# Patient Record
Sex: Male | Born: 1935 | Race: White | Hispanic: No | Marital: Married | Smoking: Former smoker
Health system: Southern US, Community
[De-identification: ages and names within clinical notes are randomized; demographics above are authoritative.]

## PROBLEM LIST (undated history)

## (undated) DIAGNOSIS — F329 Major depressive disorder, single episode, unspecified: Secondary | ICD-10-CM

## (undated) DIAGNOSIS — F32A Depression, unspecified: Secondary | ICD-10-CM

## (undated) DIAGNOSIS — Z9981 Dependence on supplemental oxygen: Secondary | ICD-10-CM

## (undated) DIAGNOSIS — R0902 Hypoxemia: Secondary | ICD-10-CM

## (undated) DIAGNOSIS — J449 Chronic obstructive pulmonary disease, unspecified: Secondary | ICD-10-CM

## (undated) DIAGNOSIS — R918 Other nonspecific abnormal finding of lung field: Secondary | ICD-10-CM

## (undated) DIAGNOSIS — I10 Essential (primary) hypertension: Secondary | ICD-10-CM

## (undated) DIAGNOSIS — J439 Emphysema, unspecified: Secondary | ICD-10-CM

## (undated) DIAGNOSIS — J45909 Unspecified asthma, uncomplicated: Secondary | ICD-10-CM

## (undated) DIAGNOSIS — IMO0001 Reserved for inherently not codable concepts without codable children: Secondary | ICD-10-CM

## (undated) DIAGNOSIS — F419 Anxiety disorder, unspecified: Secondary | ICD-10-CM

## (undated) HISTORY — DX: Emphysema, unspecified: J43.9

## (undated) HISTORY — DX: Hypoxemia: R09.02

## (undated) HISTORY — DX: Essential (primary) hypertension: I10

## (undated) HISTORY — DX: Unspecified asthma, uncomplicated: J45.909

## (undated) HISTORY — DX: Chronic obstructive pulmonary disease, unspecified: J44.9

---

## 2001-01-23 ENCOUNTER — Other Ambulatory Visit: Admission: RE | Admit: 2001-01-23 | Discharge: 2001-01-23 | Payer: Self-pay | Admitting: *Deleted

## 2001-02-16 ENCOUNTER — Encounter (INDEPENDENT_AMBULATORY_CARE_PROVIDER_SITE_OTHER): Payer: Self-pay | Admitting: *Deleted

## 2001-02-16 ENCOUNTER — Ambulatory Visit (HOSPITAL_BASED_OUTPATIENT_CLINIC_OR_DEPARTMENT_OTHER): Admission: RE | Admit: 2001-02-16 | Discharge: 2001-02-17 | Payer: Self-pay | Admitting: *Deleted

## 2001-05-21 ENCOUNTER — Ambulatory Visit (HOSPITAL_COMMUNITY): Admission: RE | Admit: 2001-05-21 | Discharge: 2001-05-21 | Payer: Self-pay | Admitting: Gastroenterology

## 2001-05-21 ENCOUNTER — Encounter (INDEPENDENT_AMBULATORY_CARE_PROVIDER_SITE_OTHER): Payer: Self-pay | Admitting: Specialist

## 2002-07-18 HISTORY — PX: OTHER SURGICAL HISTORY: SHX169

## 2003-10-04 ENCOUNTER — Emergency Department (HOSPITAL_COMMUNITY): Admission: EM | Admit: 2003-10-04 | Discharge: 2003-10-04 | Payer: Self-pay | Admitting: Emergency Medicine

## 2003-10-13 ENCOUNTER — Inpatient Hospital Stay (HOSPITAL_COMMUNITY): Admission: RE | Admit: 2003-10-13 | Discharge: 2003-10-15 | Payer: Self-pay | Admitting: Urology

## 2003-10-13 ENCOUNTER — Encounter (INDEPENDENT_AMBULATORY_CARE_PROVIDER_SITE_OTHER): Payer: Self-pay | Admitting: Specialist

## 2005-07-25 ENCOUNTER — Inpatient Hospital Stay (HOSPITAL_COMMUNITY): Admission: EM | Admit: 2005-07-25 | Discharge: 2005-07-27 | Payer: Self-pay | Admitting: Emergency Medicine

## 2006-05-17 ENCOUNTER — Encounter: Admission: RE | Admit: 2006-05-17 | Discharge: 2006-05-17 | Payer: Self-pay | Admitting: Surgery

## 2006-05-19 ENCOUNTER — Ambulatory Visit (HOSPITAL_BASED_OUTPATIENT_CLINIC_OR_DEPARTMENT_OTHER): Admission: RE | Admit: 2006-05-19 | Discharge: 2006-05-19 | Payer: Self-pay | Admitting: Surgery

## 2006-05-19 ENCOUNTER — Encounter (INDEPENDENT_AMBULATORY_CARE_PROVIDER_SITE_OTHER): Payer: Self-pay | Admitting: *Deleted

## 2006-07-18 HISTORY — PX: HERNIA REPAIR: SHX51

## 2008-09-27 ENCOUNTER — Ambulatory Visit: Payer: Self-pay | Admitting: Infectious Diseases

## 2008-09-27 ENCOUNTER — Inpatient Hospital Stay (HOSPITAL_COMMUNITY): Admission: EM | Admit: 2008-09-27 | Discharge: 2008-09-30 | Payer: Self-pay | Admitting: Emergency Medicine

## 2010-04-17 ENCOUNTER — Inpatient Hospital Stay (HOSPITAL_COMMUNITY): Admission: EM | Admit: 2010-04-17 | Discharge: 2010-04-20 | Payer: Self-pay | Admitting: Emergency Medicine

## 2010-09-30 LAB — POCT I-STAT 3, ART BLOOD GAS (G3+)
Acid-Base Excess: 1 mmol/L (ref 0.0–2.0)
O2 Saturation: 92 %
pO2, Arterial: 64 mmHg — ABNORMAL LOW (ref 80.0–100.0)

## 2010-09-30 LAB — CBC
HCT: 47.2 % (ref 39.0–52.0)
HCT: 49.1 % (ref 39.0–52.0)
Hemoglobin: 17 g/dL (ref 13.0–17.0)
MCH: 32.3 pg (ref 26.0–34.0)
MCV: 94.8 fL (ref 78.0–100.0)
MCV: 94.9 fL (ref 78.0–100.0)
Platelets: 137 10*3/uL — ABNORMAL LOW (ref 150–400)
RBC: 4.72 MIL/uL (ref 4.22–5.81)
RDW: 13.8 % (ref 11.5–15.5)
WBC: 15.7 10*3/uL — ABNORMAL HIGH (ref 4.0–10.5)
WBC: 17.9 10*3/uL — ABNORMAL HIGH (ref 4.0–10.5)
WBC: 20.4 10*3/uL — ABNORMAL HIGH (ref 4.0–10.5)

## 2010-09-30 LAB — DIFFERENTIAL
Eosinophils Absolute: 0.5 10*3/uL (ref 0.0–0.7)
Eosinophils Relative: 1 % (ref 0–5)
Eosinophils Relative: 3 % (ref 0–5)
Lymphocytes Relative: 11 % — ABNORMAL LOW (ref 12–46)
Lymphocytes Relative: 11 % — ABNORMAL LOW (ref 12–46)
Lymphocytes Relative: 7 % — ABNORMAL LOW (ref 12–46)
Lymphs Abs: 1.1 10*3/uL (ref 0.7–4.0)
Lymphs Abs: 1.9 10*3/uL (ref 0.7–4.0)
Lymphs Abs: 2.3 10*3/uL (ref 0.7–4.0)
Monocytes Absolute: 0.7 10*3/uL (ref 0.1–1.0)
Monocytes Absolute: 1.8 10*3/uL — ABNORMAL HIGH (ref 0.1–1.0)
Monocytes Absolute: 2.2 10*3/uL — ABNORMAL HIGH (ref 0.1–1.0)
Monocytes Relative: 12 % (ref 3–12)
Monocytes Relative: 5 % (ref 3–12)
Neutro Abs: 13.4 10*3/uL — ABNORMAL HIGH (ref 1.7–7.7)

## 2010-09-30 LAB — CK TOTAL AND CKMB (NOT AT ARMC)
CK, MB: 1.4 ng/mL (ref 0.3–4.0)
Relative Index: INVALID (ref 0.0–2.5)
Total CK: 26 U/L (ref 7–232)

## 2010-09-30 LAB — TROPONIN I
Troponin I: 0.02 ng/mL (ref 0.00–0.06)
Troponin I: 0.02 ng/mL (ref 0.00–0.06)

## 2010-09-30 LAB — HEPATIC FUNCTION PANEL
ALT: 21 U/L (ref 0–53)
AST: 23 U/L (ref 0–37)
Bilirubin, Direct: 0.3 mg/dL (ref 0.0–0.3)
Total Bilirubin: 1.5 mg/dL — ABNORMAL HIGH (ref 0.3–1.2)

## 2010-09-30 LAB — COMPREHENSIVE METABOLIC PANEL
AST: 19 U/L (ref 0–37)
Albumin: 3.2 g/dL — ABNORMAL LOW (ref 3.5–5.2)
Calcium: 8.9 mg/dL (ref 8.4–10.5)
Chloride: 107 mEq/L (ref 96–112)
Creatinine, Ser: 1.14 mg/dL (ref 0.4–1.5)
GFR calc Af Amer: >60 mL/min (ref >60)
Total Protein: 6.1 g/dL (ref 6.0–8.3)

## 2010-09-30 LAB — BASIC METABOLIC PANEL
BUN: 34 mg/dL — ABNORMAL HIGH (ref 6–23)
CO2: 25 mEq/L (ref 19–32)
Chloride: 103 mEq/L (ref 96–112)
GFR calc non Af Amer: 60 mL/min — ABNORMAL LOW (ref >60)
Potassium: 3.6 mEq/L (ref 3.5–5.1)
Potassium: 3.6 mEq/L (ref 3.5–5.1)
Sodium: 138 mEq/L (ref 135–145)

## 2010-09-30 LAB — D-DIMER, QUANTITATIVE: D-Dimer, Quant: 0.74 ug/mL-FEU — ABNORMAL HIGH (ref 0.00–0.48)

## 2010-09-30 LAB — BRAIN NATRIURETIC PEPTIDE: Pro B Natriuretic peptide (BNP): 132 pg/mL — ABNORMAL HIGH (ref 0.0–100.0)

## 2010-09-30 LAB — LACTIC ACID, PLASMA: Lactic Acid, Venous: 1.7 mmol/L (ref 0.5–2.2)

## 2010-10-28 LAB — CBC
HCT: 45.4 % (ref 39.0–52.0)
MCHC: 33.8 g/dL (ref 30.0–36.0)
MCHC: 34.5 g/dL (ref 30.0–36.0)
MCV: 91.4 fL (ref 78.0–100.0)
Platelets: 154 10*3/uL (ref 150–400)
RBC: 4.49 MIL/uL (ref 4.22–5.81)
RDW: 14 % (ref 11.5–15.5)

## 2010-10-28 LAB — BASIC METABOLIC PANEL
BUN: 37 mg/dL — ABNORMAL HIGH (ref 6–23)
CO2: 24 mEq/L (ref 19–32)
CO2: 25 mEq/L (ref 19–32)
Calcium: 8.6 mg/dL (ref 8.4–10.5)
Chloride: 105 mEq/L (ref 96–112)
Creatinine, Ser: 1.79 mg/dL — ABNORMAL HIGH (ref 0.4–1.5)
GFR calc Af Amer: 45 mL/min — ABNORMAL LOW (ref >60)
Glucose, Bld: 110 mg/dL — ABNORMAL HIGH (ref 70–99)
Potassium: 4.4 mEq/L (ref 3.5–5.1)

## 2010-10-28 LAB — POCT I-STAT 3, ART BLOOD GAS (G3+)
Acid-Base Excess: 1 mmol/L (ref 0.0–2.0)
Bicarbonate: 23.7 mEq/L (ref 20.0–24.0)
Patient temperature: 98.2
TCO2: 25 mmol/L (ref 0–100)

## 2010-10-28 LAB — DIFFERENTIAL
Basophils Absolute: 0.1 10*3/uL (ref 0.0–0.1)
Eosinophils Absolute: 0 10*3/uL (ref 0.0–0.7)
Eosinophils Relative: 0 % (ref 0–5)
Lymphs Abs: 1.2 10*3/uL (ref 0.7–4.0)

## 2010-10-28 LAB — D-DIMER, QUANTITATIVE: D-Dimer, Quant: 1.01 ug/mL-FEU — ABNORMAL HIGH (ref 0.00–0.48)

## 2010-11-30 NOTE — H&P (Signed)
[name], [name] NO.:  [id number]   MEDICAL RECORD NO.:  [id number]          PATIENT TYPE:  EMS   LOCATION:  MAJO                         FACILITY:  [facility]   PHYSICIAN:  Mick Sell, MD DATE OF BIRTH:  [birth year]   DATE OF ADMISSION:  09/27/2008  DATE OF DISCHARGE:                              HISTORY & PHYSICAL   CHIEF COMPLAINT:  Shortness of breath.   HISTORY OF PRESENT ILLNESS:  This is a very pleasant 77 year old white  male with a long history of COPD, prior tobacco abuse, hypertension, BPH  and depression who reports that he has had 1 week of increasing  shortness of breath and dyspnea on exertion.  He has had no cough or  hemoptysis and no fevers, chills or night sweats.  Since he has not been  feeling well, he has not been eating and drinking much and has had some  weight loss but he does not think it has been drastic.  He has been  trying to use his albuterol nebulizers and MDIs quite frequently with  only minimal relief.  He reports that when he gets up to walk to the  bathroom he cannot breathe.  He has been using home O2 which belongs to  his daughter since he has not qualified for home O2 in the past.   He also reports that he has had a cold now for the last 2 months.  He  has seen Dr. Nehemiah Settle and received two courses of antibiotics, which  initially helped but then lead to worsening when he is off of them.  He  does not remember the name of the antibiotics and he has not had any for  3-4 weeks now.  He denies any chest pain, any palpitations, any  dizziness, lightheadedness, nausea, vomiting, abdominal pain or  diarrhea.  He denies any lower extremity edema.   PAST MEDICAL HISTORY:  1. COPD.  He is on home O2 but is using his daughter's since he has      not qualified in the past.  2. Hypertension.  3. Status post TURP procedure for BPH.  4. Prior tobacco abuse, quit in 2005.  5. History of depression.  6. History of ED.  7.  History of hypertension.   FAMILY HISTORY:  Father died from a stroke.  Mother died at 75 from  heart failure.   SOCIAL HISTORY:  He is married, lives with his wife.  He used to own toy  store.  He quit tobacco in 2005.  No alcohol.   MEDICATIONS:  Per his list include:  1. Advair 250/50.  2. Spiriva 18 mcg.  3. Albuterol MDI or nebulizer as needed.  4. Norvasc 5 mg once daily.  5. Nexium 40 mg once daily.  6. Ropinirole 1 mg once daily.  7. Percocet p.r.n.   ALLERGIES:  No known drug allergies.   REVIEW OF SYSTEMS:  11 systems reviewed and negative except as per HPI.   EXAMINATION:  VITAL SIGNS: Temperature 98.2, pulse 114, blood pressure  107/62, respirations 24.  O2 saturation 95% on 5  liters, 81% on room air  when he was admitted.  GENERAL:  He is thin but in no acute distress.  His mucous membranes are  quite dry.  His conjunctivae are clear.  No icterus.  Pupils equal,  round, reactive to light and accommodation.  NECK:  Supple.  HEART:  Regular rhythm but tachycardiac.  No murmurs noted.  Distant  heart sounds.  BREATH SOUNDS:  He has decreased breath sounds throughout and a  prolonged expiratory phase but no wheezing or rhonchi.  ABDOMEN:  Soft, nontender, nondistended.  EXTREMITIES:  He has no clubbing, but does have very trace pedal edema  around his ankles bilaterally.  SKIN:  Without rash.  NEURO:  He is alert, oriented x3, grossly nonfocal neuro exam.   DATA:  The patient had blood work reviewed with a white count of 11.5,  hemoglobin 15.3, platelets 154.  His basic panel is sodium 137,  potassium 4.4, chloride 105, bicarb 24, BUN 37, creatinine 1.8.  His BNP  was within normal limits at 91.  His ABG revealed a pH 7.47, pCO2 of  32.5, pO2 of 50, bicarb 23.7, saturation of 88%.  Chest x-ray shows  marked COPD with hyperinflation, emphysema, pulmonary scarring and mild  atelectasis in the lung bases.  There is a new airspace disease in the  left upper lobe  which has the appearance of pneumonia.   IMPRESSION:  This is a 77 year old gentleman with advanced chronic  obstructive pulmonary disease presenting with 1 week of shortness of  breath and a background of 2 months of cold symptoms that have required  several courses of outpatient antibiotics.  He presents now with hypoxia  with a saturation down to 81% on room air and evidence of an infiltrate  on his chest x-ray.  He also has acute renal failure with a creatinine  up to 1.8 and had a mildly elevated white count at 11.5.   PLAN:  1. Community-acquired pneumonia.  We will admit him and place him on      intravenous ceftriaxone and azithromycin.  We will give him      Tussionex for cough.  He reports he has not been coughing anything      up so we will not be able to obtain a sputum culture.  2. Chronic obstructive pulmonary disease exacerbation.  I placed him      on Solu-Medrol.  He received 125 in the emergency department.  I      will place him on 60 every 8 and can be weaned as he improves.  We      will place him on his albuterol, Atrovent nebulizer as well as      continue the Advair.  3. Acute renal failure.  His creatinine is up to 1.8 from prior value      I have from 2007 of 1.3.  He has had marked decrease of liquid      intake he reports.  We will place him on intravenous fluids and      monitor him with a      creatinine in the morning.  We will hold his blood pressure      medications overnight as well.  4. Prophylaxis.  We will place the patient on Lovenox for deep vein      thrombosis prophylaxis given his decreased ability to ambulate with      his severe shortness of breath.  We will also keep him on his  outpatient Nexium.      Mick Sell, MD  Electronically Signed     Mick Sell, MD  Electronically Signed    DPF/MEDQ  D:  09/27/2008  T:  09/27/2008  Job:  [id number]

## 2010-12-03 NOTE — Procedures (Signed)
[facility]  Patient:    [name], [name] Visit Number: [id number] MRN: [id number]          Service Type: Attending:  Verlin Grills, M.D. Proc. Date: 05/21/01   CC:         Barbette Hair. Vaughan Basta., M.D.   Procedure Report  PROCEDURE:  Colonoscopy with polypectomy.  REFERRING PHYSICIAN:  Barbette Hair. Vaughan Basta., M.D.  PROCEDURE INDICATION:  [name] (date of birth [birth year]) is a 77 year old male, who is due for his first screening colonoscopy with polypectomy to prevent colon cancer.  I discussed with [name] the complications associated with colonoscopy and polypectomy, including a 15 per 1000 risk of bleeding and 4 per 1000 risk of colon perforation requiring surgical repair.  Ms. Requena has signed the operative permit.  ENDOSCOPIST:  Verlin Grills, M.D.  PREMEDICATION:  Versed 7 mg Demerol 50 mg.  ENDOSCOPE:  Olympus pediatric colonoscope.  DESCRIPTION OF PROCEDURE:  After obtaining informed consent, [name] was placed in the left lateral decubitus position.  I administered intravenous Demerol and intravenous Versed to achieve conscious sedation for the procedure.  The patients blood pressure, oxygen saturation, and cardiac rhythm were monitored throughout the procedure and documented in the medical record.  Anal inspection was normal.  Digital rectal exam revealed a nonnodular prostate.  The Olympus pediatric video colonoscope was introduced into the rectum and advanced to the cecum with the patient in the left lateral decubitus position.  Colonic preparation for the exam today was excellent.  RECTUM:  Normal.  SIGMOID COLON AND DESCENDING COLON:  Normal.  SPLENIC FLEXURE:  Normal.  TRANSVERSE COLON:  From the distal transverse colon, a 0.5 mm polyp was removed with cold biopsy forceps.  HEPATIC FLEXURE:  Normal.  ASCENDING COLON:  From the distal ascending colon, a 0.5 mm sessile polyp  was removed with the cold biopsy forceps, and a 1 mm sessile polyp was removed with the hot biopsy forceps.  CECUM AND ILEOCECAL VALVE:  Normal.  ASSESSMENT:  Two small polyps removed from the ascending colon, and a small polyp removed from the distal transverse colon.  All polyps submitted in one bottle for pathologic evaluation.  RECOMMENDATIONS:  If polyps return neoplastic, [name] should undergo a repeat colonoscopy in 3-5 years. Attending:  Verlin Grills, M.D. DD:  05/21/01 TD:  05/22/01 Job: [id number]

## 2010-12-03 NOTE — H&P (Signed)
NAME:  [name], [name]                          ACCOUNT NO.:  [id number]   MEDICAL RECORD NO.:  [id number]                   PATIENT TYPE:  INP   LOCATION:  X004                                 FACILITY:  [facility]   PHYSICIAN:  Maretta Bees. Vonita Moss, M.D.             DATE OF BIRTH:  1934-01-09   DATE OF ADMISSION:  10/13/2003  DATE OF DISCHARGE:                                HISTORY & PHYSICAL   HISTORY:  This 77 year old white male has had a long history of bladder  outlet obstructive symptoms.  He has been on Flomax for a year-and-a-half  with only partial relief of his symptomatology.  He went into urinary  retention despite having been switched to UroXatral and Tequin by Dr.  Merril Abbe.  He had to go to the [facility] emergency room on October 04, 2003  and he was catheterized for a large amount of residual urine.  Interestingly, he has had a right inguinal bulge that he was told was a  hernia that seemed to disappear after that and sounds like he may well have  had a herniation from a distended bladder.  I saw him on October 07, 2003 and  talked to him about various options and concluded a TUR of the prostate  would be the best long-term situation since laser and microwave do not work  that well with somebody in retention and Avodart or Proscar would take  several months to work and he would still be committed to a lifetime of  medications.  He was told a TUR of the prostate has the risk of hemorrhage,  infection, retrograde ejaculation, stricture formation, erectile  dysfunction.  He is only on Viagra from some ED.  He was cleared by Dr.  Merril Abbe for surgery.   He has a history of emphysema and depression.  His medications include  Effexor and UroXatral and Tequin.   Allergies to drugs are denied.   His only previous surgery was a cyst removed from the back of his ear on the  neck.   He quit smoking last month, does not drink alcohol.  Family history and  review of systems are  noted in the health history form and family history is  noncontributory.   PHYSICAL EXAMINATION:  VITAL SIGNS:  Blood pressure 120/80, pulse was 85,  temperature was 98.2.  GENERAL:  He was alert and oriented.  NECK:  Supple.  CHEST:  Increased diameter to the chest and decreased breath sounds.  HEART:  Heart tones are distant.  ABDOMEN:  Soft, nontender.  GENITOURINARY:  External genitalia reveal Foley catheter in place.  Prostate  feels benign and smooth.   IMPRESSION:  1. Benign prostatic hypertrophy, prostatism, and urinary retention.  2. Chronic obstructive pulmonary disease.  3. Depression.  4. History of cigarette abuse.  5. Erectile dysfunction.   PLAN:  TUR of the prostate.  Maretta Bees. Vonita Moss, M.D.    LJP/MEDQ  D:  10/13/2003  T:  10/13/2003  Job:  [id number]   cc:   Ike Bene, M.D.  [address]  [place]  [place] [address]  Fax: [phone]

## 2010-12-03 NOTE — Op Note (Signed)
NAME:  [name], [name]                          ACCOUNT NO.:  [id number]   MEDICAL RECORD NO.:  [id number]                   PATIENT TYPE:  INP   LOCATION:  0361                                 FACILITY:  [facility]   PHYSICIAN:  Maretta Bees. Vonita Moss, M.D.             DATE OF BIRTH:  06-Oct-1933   DATE OF PROCEDURE:  10/13/2003  DATE OF DISCHARGE:                                 OPERATIVE REPORT   PREOPERATIVE DIAGNOSIS:  Benign prostatic hypertrophy, prostatism and  urinary retention.   POSTOPERATIVE DIAGNOSIS:  1. Benign prostatic hypertrophy, prostatism and urinary retention.  2. Multiple small bladder stones.   OPERATION/PROCEDURE:  1. Transurethral resection of the prostate.  2. Removal of bladder stones.   SURGEON:  Maretta Bees. Vonita Moss, M.D.   ANESTHESIA:  Spinal.   INDICATIONS:  This 77 year old gentleman has gone into urinary retention and  is going to require transurethral resection of the prostate since he has  essentially failed medical therapy.  He is brought to the ER today for TUR  of the prostate.   DESCRIPTION OF PROCEDURE:  The patient is brought to the operating room,  placed in the lithotomy position.  External genitalia were prepped and  draped in the usual fashion.  He was sounded from 24-French to 30-French  without difficulty.  A 28-French resectoscope sheath was inserted and  observation of the bladder revealed multiple hemorrhagic areas consistent  with several days of Foley catheter drainage.  No papillary tumors were  seen.  He had about 30 or 40 small bladder calculi that I irrigated out of  the bladder without difficulty.  He had bi-lobar hypertrophy and the bladder  neck was resected first, and then each lateral lobe and some ancillary  tissue was resected down to the capsule.  This created a well excavated  prostatic fossa.  At this point residual chips were removed from the  bladder.  He had good hemostasis.  External sphincter was seen to be intact.  The  scope was removed and a 24-French 30 mL Foley catheter was inserted and  connected to closed drainage and 40 mL placed in the balloon.  Irrigation  cleared nicely.  Estimated blood loss was 50 mL.  He tolerated the procedure  well.                                               Maretta Bees. Vonita Moss, M.D.   LJP/MEDQ  D:  10/13/2003  T:  10/13/2003  Job:  [id number]   cc:   Ike Bene, M.D.  [address]  [place]  [place] [address]  Fax: [phone]

## 2010-12-03 NOTE — Op Note (Signed)
NAMEBENJERMIN, KORBER                ACCOUNT NO.:  [id number]   MEDICAL RECORD NO.:  [id number]          PATIENT TYPE:  AMB   LOCATION:  DSC                          FACILITY:  [facility]   PHYSICIAN:  Currie Paris, M.D.DATE OF BIRTH:  Mar 02, 1934   DATE OF PROCEDURE:  05/19/2006  DATE OF DISCHARGE:                                 OPERATIVE REPORT   OFFICE MEDICAL RECORD NUMBER:  [id number].   PREOPERATIVE DIAGNOSIS:  Right inguinal hernia.   POSTOPERATIVE DIAGNOSIS:  Right inguinal hernia - indirect.   OPERATION:  Repair right inguinal hernia with mesh.   SURGEON:  Dr. Jamey Ripa.   ANESTHESIA:  MAC.   CLINICAL HISTORY:  Mr. Farabee is a 77 year old gentleman with a right  inguinal hernia that he desired to have repaired.   DESCRIPTION OF PROCEDURE:  The patient was seen in holding area and had no  further questions.  He and I both marked the right inguinal area as the  operative site.   The patient was taken to the operating room, and after satisfactory IV  sedation, the groin area was clipped, prepped and draped.  The time-out  occurred.   I used a combination of 1% Xylocaine with epinephrine and 0.5% plain  Marcaine mixed equally.  I infiltrated along the incision line and then  subfascially near the anterior superior iliac spine.  Incision was made and  deepened to the external oblique aponeurosis with bleeders either tied with  4-0 Vicryl or coagulated.  Additional local was infiltrated as we got to  deeper layers.   The external oblique was cleaned off and opened in the line of its fibers.  The cord structures were dissected up off the inguinal floor and surrounded  with a Penrose drain.   The inguinal floor was intact although thin.  There is a fairly thick  moderate-sized indirect sac.   The sac was opened and stripped off of the cord so that we could get that  all reduced.  I then suture ligated the sac and excised the excess sac  material.   The deep ring was  very patulous, and so I closed it with a 2-0 Prolene  placed lateral to the cord structures as most of the dilation occurred  laterally.   At this point, I then placed a mesh patch on to do a tension-free repair.  This was sutured in with a 2-0 Prolene running starting at the pubic  tubercle and running along the inferior edge up to the level of the deep  ring.  It was intact more medially and superiorly to the internal oblique.  It was split to go around the cord structures and at the deep ring, and the  two tails tacked together laterally.  This produced a nice reconstruction of  the deep ring and good reinforcement of the inguinal floor.   Everything appeared to be dry.  Additional local had been infiltrated, and  the patient remained comfortable.   The external oblique was closed over the repair with 3-0 Vicryl, Scarpa's  with 2-0 Vicryl, and the skin with a 4-0  Monocryl running subcuticular  suture plus Dermabond.   The patient tolerated the procedure well.  There were no operative  complications.  All counts were correct.      Currie Paris, M.D.  Electronically Signed     CJS/MEDQ  D:  05/19/2006  T:  05/19/2006  Job:  [id number]

## 2010-12-03 NOTE — Op Note (Signed)
[place]  Patient:    [name], [name]                       MRN: [id number] Proc. Date: 02/16/01 Adm. 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  POSTOPERATIVE DIAGNOSIS:  Pending permanent histologic section.  PROCEDURE:  Left parotidectomy with facial nerve dissection.  SURGEON:  Kathy Breach, M.D.  FIRST ASSISTANT:  Jefry H. Pollyann Kennedy, M.D.  DESCRIPTION OF PROCEDURE:  With the patient under general orotracheal anesthesia, left face was prepped and draped in a sterile fashion.  Draping allowed complete visualization of the left half of the face for monitoring of facial nerve function.  Modified Blair incision was marked and tattooed, and skin incision was made with Shaw scalpel through skin, skin and subcutaneous tissue, down to the parotid fascia.  Patient had at least 8 cm large massive left parotid tumor completely covering angle of the mandible, extending 3-4 cm below the angle, free margin of the mandible, and superiorly to the preauricular area.  Bulk of the tumor was the main problem of dealing with the dissection throughout.  The skin was elevated anteriorly off of the parotid fascia and posteriorly off of the extension of the tumor beyond the usual limits of the incision and down to the fibers of the sternomastoid muscle. Posterior inferior margin of the tumor and parotid gland was elevated off of the sternomastoid muscle down to the level of the posterior facial vein, which for freedoms sake at this point had to be clamped, divided, and tied with 4-0 silk ties.  The posterior aspect of the parotid gland tumor elevated off the cartilaginous and bony external canal and anterior face of the mastoid, proceeding medially until identifying the main trunk of the facial  nerve, which was identified fairly readily.  The dissection then elevated the tumor, going from laterally to medially off of the branches of the facial nerve, being dissected laterally part of the time off the inferior division, then off the superior division, allowing the large mass of the tumor to be slowly worked forward.  The lower divisions of the nerve were very flattened out and immediately on the capsule of the gland.  At one point, capsule ruptured and dirty blackish-green, motor-oil thick secretions were within the portion of the gland, characteristic of Warthins tumor.  The branches of the nerve were dissected peripherally until the entire tumor appeared to be elevated, preserving the facial nerve branches throughout.  Hemostasis was kept complete throughout the procedure with 4-0 silk ties and light-touch cautery as appropriate.  A Jackson-Pratt drain was placed in the posterior aspect of the wound anterior to the sternomastoid muscles, the posterior belly of the digastric muscle having been completely exposed dissecting the tumor deeply from its size.  Blood loss was less than 50 cc.  The wound was then closed with interrupted 4-0 chromic catgut sutures and skin approximated with a running 5-0 Surgilon suture.  Cortisporin ointment was placed at the incision line with a Jackson-Pratt suction drain in place.  The patient tolerated the procedure well and was taken to the recovery room in stable general condition. DD:  02/16/01 TD:  02/17/01 Job: [id number]

## 2010-12-03 NOTE — Discharge Summary (Signed)
[name], [name]                ACCOUNT NO.:  [id number]   MEDICAL RECORD NO.:  [id number]          PATIENT TYPE:  INP   LOCATION:  5733                         FACILITY:  [facility]   PHYSICIAN:  Ladell Pier, M.D.   DATE OF BIRTH:  Oct 13, 1933   DATE OF ADMISSION:  07/24/2005  DATE OF DISCHARGE:                                 DISCHARGE SUMMARY   DISCHARGE DIAGNOSES:  1.  Chronic obstructive pulmonary disease exacerbation.  2.  Cough.  3.  Tobacco abuse.  4.  History of benign prostatic hypertrophy status post procedure done by      Dr. Vonita Moss.  5.  Depression.  6.  History of erectile dysfunction.   DISCHARGE MEDICATIONS:  1.  Avalox 400 mg daily x 7 days.  2.  Prednisone taper 40 mg daily x 3 days and 20 mg daily x 3 days, 10 mg x      3 days and then stop.  3.  Tussionex 5 cc b.i.d. p.r.n.  4.  Spiriva inhaled daily.  5.  Advair 250/50 twice daily to restart after prednisone taper.  6.  Albuterol nebulizer 4 x day.  7.  Darvocet N100 every 6 hours as needed for pain.   HOSPITAL FOLLOW UP:  The patient is to follow up in the office within 2  weeks.   HISTORY OF PRESENT ILLNESS:  The patient is 77 year old white male that  presented with COPD exacerbation.  He was coughing, short of breath and had  desaturating oxygen levels. He had a chest x-ray that showed questionable  infiltrate.   PAST MEDICAL HISTORY/FAMILY HISTORY/SOCIAL HISTORY/MEDS/ALLERGIES/REVIEW OF  SYSTEMS:  See H and P.   PHYSICAL EXAMINATION ON DISCHARGE:  VITAL SIGNS:  Temperature 98.6, pulse  90, respirations 20, blood pressure 120/75.  CVD 117, pulse ox 95% on 2  liters.  The patient will be ambulated and pulse ox checked to see if he  will need home oxygen.  HEENT:  Normocephalic, atraumatic.  Pupils are equal, round and reactive to  light.  Throat without erythema.  CARDIOVASCULAR:  Regular rhythm.  LUNGS:  Minimal decrease in breath sounds, but clear.  ABDOMEN:  Positive bowels.  EXTREMITIES:  No edema.   HOSPITAL COURSE:  1.  Chronic obstructive pulmonary disease exacerbation.  He was admitted to      a regular bed.  He was treated with IV steroids, IV antibiotics and      cough syrup.  He was placed also on nasal cannula.  His symptoms      improved throughout his hospitalization.  He was breathing better,      ambulating better, felt much better. He was discharged on a prednisone      taper, antibiotics and told to resume his home medications.  2.  Labs in the hospital.  Blood cultures negative x 2.  CMP: Sodium 135,      potassium 4.2, chloride 106, CO2 of 24, glucose of 174, BUN 20,      creatinine of 1.3.  Bilirubin 0.9.  All other LFT are normal.      Urinalysis  normal.  Cardiac enzymes normal.  CBC, LVC on admission was      13.3.  Hemoglobin 15.1, platelets 188.  BNP 60.2.  Chest x-ray bilateral      lower lung air space disease versus scarring, pneumonia not excluded.      Severe COPD and cardiomegaly.  The patient also received Pneumovax while      in the hospital.      Ladell Pier, M.D.  Electronically Signed     NJ/MEDQ  D:  07/27/2005  T:  07/27/2005  Job:  [id number]

## 2010-12-03 NOTE — Discharge Summary (Signed)
[name], [name]              ACCOUNT NO.:  [id number]   MEDICAL RECORD NO.:  [id number]          PATIENT TYPE:  INP   LOCATION:  4715                         FACILITY:  [facility]   PHYSICIAN:  Deirdre Peer. Polite, M.D. DATE OF BIRTH:  18-Apr-1934   DATE OF ADMISSION:  09/27/2008  DATE OF DISCHARGE:  09/30/2008                               DISCHARGE SUMMARY   DISCHARGE DIAGNOSES:  1. Left upper lobe pneumonia exacerbate underlying chronic obstructive      pulmonary disease.  2. Chronic obstructive pulmonary disease, oxygen-dependent; however,      the patient failed to qualify for oxygen in the past and had been      using his daughter's nocturnal oxygen at home.  Discharged with      oxygen this hospitalization.  3. Elevated D-dimer within indeterminate V/Q and his chest x-ray      showed pneumonia.  He has underlying chronic obstructive pulmonary      disease.  As the patient did not have any complaints of chest pain,      pain in his leg, he was treated as if this was his chronic      obstructive pulmonary disease exacerbated by underlying pneumonia.      The patient discharged on anticoagulation and no further studies      pursued (due to elevated D-dimer).  4. Hypertension.  5. Benign prostatic hypertrophy status post transurethral resection of      prostate  6. Depression.  7. Erectile dysfunction.  8. Azotemia, creatinine 1.79   DISCHARGE MEDICATIONS:  1. Avelox 40 mg daily x5 days.  2. Prednisone taper over 8 days.  3. Advair 250/50 b.i.d.  4. Spiriva daily.  5. Albuterol p.r.n.  6. Percocet 1-2 tablets q.4 h. p.r.n.  7. Norvasc 5 mg daily.  8. Nexium 40 mg daily.  9. Ropinirole 1 mg at bedtime.   DISPOSITION:  The patient discharged to home in stable condition with  home oxygen at 2 liters.  The patient asked to follow up with primary MD  in 1 week.   CONSULTANTS:  None.   STUDIES:  Elevated D-dimer. V/Q scan indeterminate.  CBC within normal  limits.  Chest  x-ray showed, left upper lobe pneumonia.  Chest x-ray on  September 29, 2008 showed improved left upper lobe airspace disease in the  patient with severe emphysema.   HISTORY OF PRESENT ILLNESS:  This is a 77 year old male presented to the  ED with progressive dyspnea at home.  In the ED the patient was  evaluated, felt to have community-acquired pneumonia, exacerbated  underlying chronic obstructive pulmonary disease.  Please see dictated H  and P for further details.   PAST MEDICAL HISTORY:  As stated above.   MEDICATIONS ON ADMISSION:  Per admission H and P.   SOCIAL HISTORY:  Per admission H and P.   HOSPITAL COURSE:  The patient was admitted to a medicine floor bed for  evaluation treatment of pneumonia,  exacerbate underlying COPD.  The  patient was treated in a typical fashion with oxygen, antibiotics, O2,  nebs, and steroids.  The  patient's hospital course was one of slow but  continued improvement.  Upon ambulating, the patient had significant  decrease in his O2 saturations.  Further studies were obtained, i.e., D-  dimer and subsequent V/Q scan.  The V/Q scan was indeterminate.  The  patient's drop in O2 was felt to be the result of his severe emphysema  and underlying pneumonia.  The patient did not have any further  evaluation for the elevated D-dimer and indeterminate V/Q scan.  The  patient did well with oxygen and was felt to be stable for discharge,  and discharged home.  The patient was discharged on September 30, 2008 with  medications as stated above.      Deirdre Peer. Polite, M.D.  Electronically Signed     Deirdre Peer. Polite, M.D.  Electronically Signed    RDP/MEDQ  D:  10/22/2008  T:  10/23/2008  Job:  [id number]

## 2010-12-03 NOTE — H&P (Signed)
NAMEDARNEL, MCHAN                ACCOUNT NO.:  [id number]   MEDICAL RECORD NO.:  [id number]          PATIENT TYPE:  INP   LOCATION:  5733                         FACILITY:  [facility]   PHYSICIAN:  Ladell Pier, M.D.   DATE OF BIRTH:  07-Jul-1934   DATE OF ADMISSION:  07/24/2005  DATE OF DISCHARGE:                                HISTORY & PHYSICAL   CHIEF COMPLAINT:  Shortness of breath.   HISTORY OF PRESENT ILLNESS:  The patient is a 77 year old white male with  past medical history significant for COPD, tobacco abuse, depression.  The  patient presented to the office 4 days ago with shortness of breath.  He  stated that he had an appointment to see the nurse practitioner, but the  nurse practitioner was running behind, and he could not wait, so he left.  Then over the weekend, his cough got progressively worse.  Shortness of  breath got progressively worse.  He had to call the ambulance.  He received  several nebulizer treatments, but his symptoms keep progressing, so he came  to the emergency room.   PAST MEDICAL HISTORY:  Significant for:  1.  COPD.  2.  Tobacco abuse which he quit back in 2005, then restarted and now quit      for 2 weeks.  3.  History of BPH status post procedure done by Dr. Vonita Moss.  4.  History of depression.  5.  History of erectile dysfunction.  6.  History of urinary retention.   FAMILY HISTORY:  Father died from a stroke.  Mother died at 18 from heart  failure.   SOCIAL HISTORY:  He is married.  He is retired.  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  He smoked a pack of cigarettes per day, then quit in 2005,  restarted, and now quit x2 weeks.   MEDICATIONS:  1.  Advair 250/50 twice daily.  2.  Spiriva daily.  3.  Effexor XR, discontinued.  4.  Albuterol nebulizers.  5.  Tussionex twice daily p.r.n.  6.  Darvocet-N 100 p.r.n.   ALLERGIES:  No known drug allergies   REVIEW OF SYSTEMS:  As stated in HPI.   PHYSICAL EXAMINATION:  VITAL SIGNS:   Temperature 98.8, blood pressure  132/75, respirations 22, pulse 109, pulse oximetry 93% on 1 liter.  HEENT:  Normocephalic and atraumatic.  Pupils equal, round, and reactive to  light.  Throat without erythema.  CARDIOVASCULAR: Regular rate and rhythm.  LUNGS: Clear bilaterally.  Decreased breath sounds throughout.  ABDOMEN:  Positive bowel sounds.  EXTREMITIES: Without edema.   LABORATORY DATA:  Sodium 135, potassium 4.2, chloride 106, CO2 24, BUN 20,  creatinine 1.3, glucose 174.   Chest x-ray showed bilateral bibasilar air space disease, question of  pneumonia.   UA negative.  Blood cultures negative.   ASSESSMENT AND PLAN:  1.  Chronic obstructive pulmonary disease exacerbation: Will admit, Rocephin      and Zithromax, and IV steroids as well as oxygen.  Will monitor him for      a few days until he  is more near his baseline.  2.  Depression. He does not take his medication anymore.  He has been doing      fine.      Ladell Pier, M.D.  Electronically Signed     NJ/MEDQ  D:  07/25/2005  T:  07/25/2005  Job:  [id number]

## 2010-12-03 NOTE — Discharge Summary (Signed)
NAME:  [name], [name]                          ACCOUNT NO.:  [id number]   MEDICAL RECORD NO.:  [id number]                   PATIENT TYPE:  INP   LOCATION:  0361                                 [facility]ACILITY:  [facility]   PHYSICIAN:  Maretta Bees. Vonita Moss, M.D.             DATE OF BIRTH:  1934-06-30   DATE OF ADMISSION:  10/13/2003  DATE OF DISCHARGE:  10/15/2003                                 DISCHARGE SUMMARY   FINAL DIAGNOSES:  1. Benign prostatic hypertrophy and urinary retention.  2. Chronic obstructive pulmonary disease.  3. Depression.  4. History of cigarette abuse.  5. Erectile dysfunction.  6. Bladder calculi.   PROCEDURE:  Transurethral resection of the prostate and removal of multiple  small bladder stones on October 13, 2003.   HISTORY:  This 77 year old white male has had a long history of bladder  outlet obstruction symptoms and finally went into urinary retention, despite  being on Flomax and switching to Uroxatral.  All options were discussed with  him, and he opted for TUR of the prostate and was aware of the risks of  impotence, hemorrhage, etc.   PHYSICAL EXAMINATION:  Noncontributory except for Foley catheter in place  and a benign-feeling prostate.   HOSPITAL COURSE:  After admission, he underwent a cystoscopy and removal of  multiple small bladder stones and TUR of the prostate.  Final path is  pending.  His postop course is quite benign.  His postop labs are fine.  He  had his Foley removed on the morning of discharge and voided well after that  with a good stream and better than he has had in years.  He did have some  numbness and burning pain on the top of his right thigh that I discussed  with him, and anesthesia is also seeing him.  Hopefully, this will resolve  with time.  His stirrups did not put any pressure in that nerve  distribution.   He will go home with limited activity for a month.  He was sent home in good  condition.  He will continue his usual  medications of Effexor, Advair disk,  and his nebulizers, but he will stop his Flomax.  I did give him Vicodin for  pain, especially in this right thigh.  He is also sent home on five days of  Cipro 0.5 gm b.i.d.  He will return to the office in two weeks in followup.  He was told he should be on light activity for a month.  He was advised  about the risk of postop bleeding.                                               Maretta Bees. Vonita Moss, M.D.    LJP/MEDQ  D:  10/15/2003  T:  10/15/2003  Job:  [id number]   cc:   Ike Bene, M.D.  [address]  [place]  [place] [address]  Fax: [phone]

## 2012-08-24 ENCOUNTER — Other Ambulatory Visit: Payer: Self-pay | Admitting: Internal Medicine

## 2012-08-24 DIAGNOSIS — N200 Calculus of kidney: Secondary | ICD-10-CM

## 2012-08-24 DIAGNOSIS — R109 Unspecified abdominal pain: Secondary | ICD-10-CM

## 2012-08-24 DIAGNOSIS — R839 Unspecified abnormal finding in cerebrospinal fluid: Secondary | ICD-10-CM

## 2012-08-28 ENCOUNTER — Ambulatory Visit
Admission: RE | Admit: 2012-08-28 | Discharge: 2012-08-28 | Disposition: A | Discharge status: Home / Self Care | Payer: Medicare Other | Source: Ambulatory Visit | Attending: Internal Medicine | Admitting: Internal Medicine

## 2012-08-28 DIAGNOSIS — R839 Unspecified abnormal finding in cerebrospinal fluid: Secondary | ICD-10-CM

## 2012-08-28 DIAGNOSIS — R109 Unspecified abdominal pain: Secondary | ICD-10-CM

## 2012-08-28 DIAGNOSIS — N200 Calculus of kidney: Secondary | ICD-10-CM

## 2012-09-06 ENCOUNTER — Ambulatory Visit (INDEPENDENT_AMBULATORY_CARE_PROVIDER_SITE_OTHER): Payer: Medicare Other | Admitting: General Surgery

## 2012-09-06 ENCOUNTER — Encounter (INDEPENDENT_AMBULATORY_CARE_PROVIDER_SITE_OTHER): Payer: Self-pay | Admitting: General Surgery

## 2012-09-06 VITALS — BP 142/84 | HR 88 | Resp 20 | Ht 73.0 in | Wt 178.0 lb

## 2012-09-06 DIAGNOSIS — K8020 Calculus of gallbladder without cholecystitis without obstruction: Secondary | ICD-10-CM

## 2012-09-06 NOTE — Patient Instructions (Signed)
Low fat diet

## 2012-09-06 NOTE — Progress Notes (Signed)
Patient ID: [name], male   DOB: 02/11/1934, 79 y.o.   MRN: [id number]  Chief Complaint  Patient presents with  . Other    Eval gallbladder    HPI [name] is a 79 y.o. male.   HPI  He is referred by Dr. Nehemiah Settle for further evaluation of gallstones.  Recently, he had an episode of bilateral lower flank and back pain right side greater than the left side. It was not associated with a meal or activity. It was associated with some constipation. The pain persisted and after 3 or 4 days and then did improve. He underwent a CT scan which demonstrated a left-sided kidney stone. Gallstones were also demonstrated. No ductal dilatation. No acute inflammatory changes. He currently no longer has the pain. His bowels are now working normally. He states he did get a little relief after having a bowel movement as he had been constipated 2 days prior to the episode. He has no right upper quadrant or epigastric pain. No postprandial nausea or vomiting.  Past Medical History  Diagnosis Date  . Asthma   . COPD (chronic obstructive pulmonary disease)   . Emphysema of lung   . Hypertension     Past Surgical History  Procedure Laterality Date  . Lump removed from neck  2004  . Hernia repair  2008    History reviewed. No pertinent family history.  Social History History  Substance Use Topics  . Smoking status: Former Smoker    Quit date: 09/06/2002  . Smokeless tobacco: Not on file  . Alcohol Use: No    Not on File  Current Outpatient Prescriptions  Medication Sig Dispense Refill  . albuterol (PROVENTIL) (2.5 MG/3ML) 0.083% nebulizer solution Take 2.5 mg by nebulization every 6 (six) hours as needed for wheezing.      . Fluticasone-Salmeterol (ADVAIR) 250-50 MCG/DOSE AEPB Inhale 1 puff into the lungs every 12 (twelve) hours.      Marland Kitchen losartan (COZAAR) 50 MG tablet Take 50 mg by mouth daily.      Marland Kitchen oxyCODONE-acetaminophen (PERCOCET/ROXICET) 5-325 MG per tablet Take 1 tablet by mouth  every 4 (four) hours as needed for pain.      . Oxygen Permeable Lens Products SOLN by Does not apply route.      . sildenafil (VIAGRA) 100 MG tablet Take 100 mg by mouth daily as needed for erectile dysfunction.       No current facility-administered medications for this visit.    Review of Systems Review of Systems  Constitutional: Negative for fever, chills and appetite change.  Respiratory: Positive for shortness of breath (He has severe COPD).   Cardiovascular: Negative.   Gastrointestinal: Negative for nausea, vomiting and diarrhea.  Musculoskeletal: Positive for arthralgias.    Blood pressure 142/84, pulse 88, resp. rate 20, height 6\' 1"  (1.854 m), weight 178 lb (80.74 kg).  Physical Exam Physical Exam  Constitutional:  Thin male in NAD.  On oxygen.  HENT:  Head: Normocephalic and atraumatic.  Eyes: No scleral icterus.  Cardiovascular: Normal rate and regular rhythm.   Pulmonary/Chest:  Breath sounds are distant. They are equal.  Abdominal: Soft. He exhibits no mass. There is no tenderness.  Small umbilical bulge consistent with hernia.  Neurological: He is alert.  Skin: Skin is warm and dry.  Psychiatric: He has a normal mood and affect. His behavior is normal.    Data Reviewed Notes from Dr. Nehemiah Settle.  CT scan report.  Assessment    Cholelithiasis.  The pain he describes to me is not consistent with symptomatic cholelithiasis. It seemed to get better after he had a bowel movement. With his severe lung disease, I feel that needs to be definite strong evidence that he is having symptomatic cholelithiasis prior to considering cholecystectomy. I do not believe we have at this time.     Plan    Low-fat diet was recommended and we gave him instructions on this. I given him a gallbladder disease booklet to read. We'll be glad to see him back if he has more typical symptoms of biliary colic. Prior to surgery he would need a pulmonary consult.        [name]  J 09/06/2012, 12:27 PM

## 2012-10-03 ENCOUNTER — Encounter (INDEPENDENT_AMBULATORY_CARE_PROVIDER_SITE_OTHER): Payer: Self-pay

## 2013-05-03 ENCOUNTER — Encounter (HOSPITAL_COMMUNITY): Payer: Self-pay | Admitting: Emergency Medicine

## 2013-05-03 ENCOUNTER — Emergency Department (HOSPITAL_COMMUNITY)
Admission: EM | Admit: 2013-05-03 | Discharge: 2013-05-03 | Disposition: A | Discharge status: Home / Self Care | Payer: Medicare Other | Attending: Emergency Medicine | Admitting: Emergency Medicine

## 2013-05-03 ENCOUNTER — Emergency Department (HOSPITAL_COMMUNITY): Payer: Medicare Other

## 2013-05-03 DIAGNOSIS — Z87891 Personal history of nicotine dependence: Secondary | ICD-10-CM | POA: Insufficient documentation

## 2013-05-03 DIAGNOSIS — S6000XA Contusion of unspecified finger without damage to nail, initial encounter: Secondary | ICD-10-CM | POA: Insufficient documentation

## 2013-05-03 DIAGNOSIS — Y929 Unspecified place or not applicable: Secondary | ICD-10-CM | POA: Insufficient documentation

## 2013-05-03 DIAGNOSIS — W010XXA Fall on same level from slipping, tripping and stumbling without subsequent striking against object, initial encounter: Secondary | ICD-10-CM | POA: Insufficient documentation

## 2013-05-03 DIAGNOSIS — S40029A Contusion of unspecified upper arm, initial encounter: Secondary | ICD-10-CM | POA: Insufficient documentation

## 2013-05-03 DIAGNOSIS — S40019A Contusion of unspecified shoulder, initial encounter: Secondary | ICD-10-CM | POA: Insufficient documentation

## 2013-05-03 DIAGNOSIS — I10 Essential (primary) hypertension: Secondary | ICD-10-CM | POA: Insufficient documentation

## 2013-05-03 DIAGNOSIS — J4489 Other specified chronic obstructive pulmonary disease: Secondary | ICD-10-CM | POA: Insufficient documentation

## 2013-05-03 DIAGNOSIS — Z79899 Other long term (current) drug therapy: Secondary | ICD-10-CM | POA: Insufficient documentation

## 2013-05-03 DIAGNOSIS — IMO0002 Reserved for concepts with insufficient information to code with codable children: Secondary | ICD-10-CM | POA: Insufficient documentation

## 2013-05-03 DIAGNOSIS — J449 Chronic obstructive pulmonary disease, unspecified: Secondary | ICD-10-CM | POA: Insufficient documentation

## 2013-05-03 DIAGNOSIS — Y9389 Activity, other specified: Secondary | ICD-10-CM | POA: Insufficient documentation

## 2013-05-03 DIAGNOSIS — T07XXXA Unspecified multiple injuries, initial encounter: Secondary | ICD-10-CM

## 2013-05-03 DIAGNOSIS — S298XXA Other specified injuries of thorax, initial encounter: Secondary | ICD-10-CM | POA: Insufficient documentation

## 2013-05-03 MED ORDER — ONDANSETRON HCL 8 MG PO TABS: 8.0000 mg | ORAL_TABLET | Freq: Three times a day (TID) | ORAL | Status: DC | PRN

## 2013-05-03 MED ORDER — OXYCODONE-ACETAMINOPHEN 5-325 MG PO TABS
1.0000 | ORAL_TABLET | Freq: Once | ORAL | Status: AC
  Administered 2013-05-03: 1 via ORAL
  Filled 2013-05-03: qty 1

## 2013-05-03 NOTE — ED Provider Notes (Signed)
CSN: [id number]     Arrival date & time 05/03/13  1405 History   None    Chief Complaint  Patient presents with  . Fall  . Shoulder Pain   (Consider location/radiation/quality/duration/timing/severity/associated sxs/prior Treatment) HPI Patient fell from a height proximally 3 feet 2 hours ago he was standing inside of a truck and missed his footing, landing on his right shoulder. He complains of pain at right scapula, nonradiating, pleuritic worse with deep inspiration. He also complains of slight pain at left fifth finger. No treatment prior to coming here. Pain is moderate. No other associated symptoms. Past Medical History  Diagnosis Date  . Asthma   . COPD (chronic obstructive pulmonary disease)   . Emphysema of lung   . Hypertension    Past Surgical History  Procedure Laterality Date  . Lump removed from neck  2004  . Hernia repair  2008   History reviewed. No pertinent family history. History  Substance Use Topics  . Smoking status: Former Smoker    Quit date: 09/06/2002  . Smokeless tobacco: Not on file  . Alcohol Use: No    Review of Systems  Cardiovascular: Positive for chest pain.       Pain at right scapula, pleuritic  Musculoskeletal: Positive for arthralgias and back pain.       Back pain, chronic    Allergies  Review of patient's allergies indicates no known allergies.  Home Medications   Current Outpatient Rx  Name  Route  Sig  Dispense  Refill  . albuterol (PROVENTIL) (2.5 MG/3ML) 0.083% nebulizer solution   Nebulization   Take 2.5 mg by nebulization every 6 (six) hours as needed for wheezing.         . Fluticasone-Salmeterol (ADVAIR) 250-50 MCG/DOSE AEPB   Inhalation   Inhale 1 puff into the lungs every 12 (twelve) hours.         Marland Kitchen losartan (COZAAR) 50 MG tablet   Oral   Take 50 mg by mouth daily.         Marland Kitchen oxyCODONE-acetaminophen (PERCOCET/ROXICET) 5-325 MG per tablet   Oral   Take 1 tablet by mouth every 4 (four) hours as needed  for pain.         . Oxygen Permeable Lens Products SOLN   Does not apply   by Does not apply route.         . sildenafil (VIAGRA) 100 MG tablet   Oral   Take 100 mg by mouth daily as needed for erectile dysfunction.          BP 131/56  Pulse 104  Temp(Src) 98.4 F (36.9 C) (Oral)  Resp 22  SpO2 97% Physical Exam  Nursing note and vitals reviewed. Constitutional:  Chronically ill-appearing  HENT:  Head: Normocephalic and atraumatic.  Eyes: Conjunctivae are normal. Pupils are equal, round, and reactive to light.  Neck: Neck supple. No tracheal deviation present. No thyromegaly present.  Cardiovascular: Normal rate and regular rhythm.   No murmur heard. Pulmonary/Chest: Effort normal and breath sounds normal.  Speaks in paragraphs, diminished breath sounds diffusely.  Abdominal: Soft. Bowel sounds are normal. He exhibits no distension. There is no tenderness.  Musculoskeletal: Normal range of motion. He exhibits no edema and no tenderness.  Tire spine nontender, pelvis stable nontender. Right upper extremity only tender over scapula. Left upper extremity slight deformity fifth finger and ecchymotic over the middle phalanx. Full range of motion neurovascular intact in all other extremities without contusion abrasion or tenderness  neurovascularly intact.  Neurological: He is alert. Coordination normal.  Skin: Skin is warm and dry. No rash noted.  Psychiatric: He has a normal mood and affect.    ED Course  Procedures (including critical care time) Labs Review Labs Reviewed - No data to display Imaging Review No results found.  EKG Interpretation   None     xrays viewed  By me Patient resting comfortably after treatment with Percocet. MDM  No diagnosis found. Plan he's been informed of nodule at the left lung seen on today's chest x-ray. He followup with Dr. polite to schedule CT scan of the chest within the next 3 months. He has Percocet for pain and she is advised  to take 1 tablet 4 times daily as needed. A prescription for Zofran has been written as he states he becomes nauseated at times. Diagnosis #1 fall #2 contusions multiple sites #3 left lung nodule    Doug Sou, MD 05/03/13 1706

## 2013-05-03 NOTE — ED Provider Notes (Deleted)
CSN: [id number]     Arrival date & time 05/03/13  1405 History   First MD Initiated Contact with Patient 05/03/13 1440     Chief Complaint  Patient presents with  . Fall  . Shoulder Pain   (Consider location/radiation/quality/duration/timing/severity/associated sxs/prior Treatment) HPI  Past Medical History  Diagnosis Date  . Asthma   . COPD (chronic obstructive pulmonary disease)   . Emphysema of lung   . Hypertension    Past Surgical History  Procedure Laterality Date  . Lump removed from neck  2004  . Hernia repair  2008   History reviewed. No pertinent family history. History  Substance Use Topics  . Smoking status: Former Smoker    Quit date: 09/06/2002  . Smokeless tobacco: Not on file  . Alcohol Use: No    Review of Systems  Allergies  Review of patient's allergies indicates no known allergies.  Home Medications   Current Outpatient Rx  Name  Route  Sig  Dispense  Refill  . albuterol (PROVENTIL) (2.5 MG/3ML) 0.083% nebulizer solution   Nebulization   Take 2.5 mg by nebulization every 6 (six) hours as needed for wheezing.         . Fluticasone-Salmeterol (ADVAIR) 250-50 MCG/DOSE AEPB   Inhalation   Inhale 1 puff into the lungs every 12 (twelve) hours.         Marland Kitchen losartan (COZAAR) 50 MG tablet   Oral   Take 50 mg by mouth daily.         Marland Kitchen oxyCODONE-acetaminophen (PERCOCET/ROXICET) 5-325 MG per tablet   Oral   Take 1 tablet by mouth every 6 (six) hours as needed for pain.           BP 123/85  Pulse 52  Temp(Src) 98.4 F (36.9 C) (Oral)  Resp 18  SpO2 96% Physical Exam  ED Course  Procedures (including critical care time) Labs Review Labs Reviewed - No data to display Imaging Review Dg Chest 2 View  05/03/2013   CLINICAL DATA:  Fall  EXAM: CHEST  2 VIEW  COMPARISON:  12/14/2010  FINDINGS: Hyperaeration and severe bilateral chronic lung disease are stable. A new nodular density has developed in the left midlung zone measuring  approximately 9 mm. Upper normal heart size. Osteopenia. Right thoracic rib cage deformity has a chronic appearance. No definite acute fracture. No pneumothorax.  IMPRESSION: New 9 mm nodular density in the left midlung zone. Consider chest CT to evaluate for pulmonary nodule.  No evidence of injury.   Electronically Signed   By: Maryclare Bean M.D.   On: 05/03/2013 15:26   Dg Scapula Right  05/03/2013   CLINICAL DATA:  Fall  EXAM: RIGHT SCAPULA - 2+ VIEWS  COMPARISON:  None.  FINDINGS: No acute fracture. No dislocation. Mild degenerative change of the [facility] joint.  IMPRESSION: No acute bony pathology. Chronic changes.   Electronically Signed   By: Maryclare Bean M.D.   On: 05/03/2013 15:27   Dg Finger Little Left  05/03/2013   CLINICAL DATA:  Fall  EXAM: LEFT LITTLE FINGER 2+V  COMPARISON:  None.  FINDINGS: No acute fracture. No dislocation. Degenerative change of the IP joints is noted. Osteopenia.  IMPRESSION: No acute bony pathology.   Electronically Signed   By: Maryclare Bean M.D.   On: 05/03/2013 15:28    EKG Interpretation   None      Patient resting comfortably after treatment with Percocet. MDM  No diagnosis found. Plan patient has prescription for  Percocet at home. He is advised he can take 1 tablet 4 times daily as needed for pain. He's been advised of left lung nodule. Suggest he contact his primary care physician Dr. polite to order CT scan of chest within the next 3 months. Diagnosis #1 fall #2 contusions multiple sites #3 left lung nodule    Doug Sou, MD 05/03/13 1620

## 2013-05-03 NOTE — ED Notes (Addendum)
Per EMS patient with Hx of emphysema, asthma, COPD was helping unload a truck, slipped, fell backwards, landed on cement, and since has felt nauseated and generalized pain, which has since focused to his right scapula only with inspiration. Patient also c/o left 5th finger pain with ecchymosis. No loss of movement or sensation. Patient also reports increased SOB after the fall which has since resolved; patient wears 3L/min O2 at home. Patient denies hitting head, denies LOC.

## 2013-05-03 NOTE — ED Notes (Signed)
Bed: WA15 Expected date:  Expected time:  Means of arrival:  Comments: EMS-fall 

## 2013-05-17 ENCOUNTER — Other Ambulatory Visit: Payer: Self-pay | Admitting: Internal Medicine

## 2013-05-17 DIAGNOSIS — R911 Solitary pulmonary nodule: Secondary | ICD-10-CM

## 2013-05-22 ENCOUNTER — Ambulatory Visit
Admission: RE | Admit: 2013-05-22 | Discharge: 2013-05-22 | Disposition: A | Discharge status: Home / Self Care | Payer: Commercial Managed Care - HMO | Source: Ambulatory Visit | Attending: Internal Medicine | Admitting: Internal Medicine

## 2013-05-22 DIAGNOSIS — R911 Solitary pulmonary nodule: Secondary | ICD-10-CM

## 2013-05-24 ENCOUNTER — Other Ambulatory Visit (HOSPITAL_COMMUNITY): Payer: Self-pay | Admitting: Internal Medicine

## 2013-05-24 ENCOUNTER — Encounter: Payer: Self-pay | Admitting: *Deleted

## 2013-05-24 DIAGNOSIS — R222 Localized swelling, mass and lump, trunk: Secondary | ICD-10-CM

## 2013-05-24 DIAGNOSIS — R9389 Abnormal findings on diagnostic imaging of other specified body structures: Secondary | ICD-10-CM

## 2013-05-24 NOTE — Progress Notes (Signed)
Pt referred 05/23/13 for lung nodule.  I spoke with referring office.  They will order a PET scan.  I stated after scan is scheduled I will set up with appt.

## 2013-05-27 ENCOUNTER — Telehealth: Payer: Self-pay | Admitting: *Deleted

## 2013-05-27 NOTE — Telephone Encounter (Signed)
Called and spoke with pt regarding appt for mtoc 06/06/13 at 3:00 arrive at 2:45.  He verbalized understanding of time and place of appt

## 2013-06-04 ENCOUNTER — Encounter (HOSPITAL_COMMUNITY): Payer: Self-pay

## 2013-06-04 ENCOUNTER — Encounter (HOSPITAL_COMMUNITY)
Admission: RE | Admit: 2013-06-04 | Discharge: 2013-06-04 | Disposition: A | Discharge status: Home / Self Care | Payer: Medicare Other | Source: Ambulatory Visit | Attending: Internal Medicine | Admitting: Internal Medicine

## 2013-06-04 DIAGNOSIS — R9389 Abnormal findings on diagnostic imaging of other specified body structures: Secondary | ICD-10-CM

## 2013-06-04 DIAGNOSIS — J984 Other disorders of lung: Secondary | ICD-10-CM | POA: Insufficient documentation

## 2013-06-04 DIAGNOSIS — R911 Solitary pulmonary nodule: Secondary | ICD-10-CM | POA: Insufficient documentation

## 2013-06-04 DIAGNOSIS — R222 Localized swelling, mass and lump, trunk: Secondary | ICD-10-CM

## 2013-06-04 LAB — GLUCOSE, CAPILLARY: Glucose-Capillary: 92 mg/dL (ref 70–99)

## 2013-06-04 MED ORDER — FLUDEOXYGLUCOSE F - 18 (FDG) INJECTION
17.2000 | Freq: Once | INTRAVENOUS | Status: AC | PRN
  Administered 2013-06-04: 17.2 via INTRAVENOUS

## 2013-06-06 ENCOUNTER — Encounter (INDEPENDENT_AMBULATORY_CARE_PROVIDER_SITE_OTHER): Payer: Self-pay

## 2013-06-06 ENCOUNTER — Institutional Professional Consult (permissible substitution) (INDEPENDENT_AMBULATORY_CARE_PROVIDER_SITE_OTHER): Payer: Medicare Other | Admitting: Cardiothoracic Surgery

## 2013-06-06 ENCOUNTER — Ambulatory Visit: Payer: Medicare Other | Attending: Cardiothoracic Surgery | Admitting: Physical Therapy

## 2013-06-06 ENCOUNTER — Encounter: Payer: Self-pay | Admitting: *Deleted

## 2013-06-06 VITALS — BP 153/88 | HR 105 | Temp 98.1°F | Resp 17 | Ht 73.0 in | Wt 177.3 lb

## 2013-06-06 DIAGNOSIS — J449 Chronic obstructive pulmonary disease, unspecified: Secondary | ICD-10-CM

## 2013-06-06 DIAGNOSIS — R918 Other nonspecific abnormal finding of lung field: Secondary | ICD-10-CM

## 2013-06-06 DIAGNOSIS — N200 Calculus of kidney: Secondary | ICD-10-CM | POA: Insufficient documentation

## 2013-06-06 DIAGNOSIS — Z9981 Dependence on supplemental oxygen: Secondary | ICD-10-CM

## 2013-06-06 NOTE — Progress Notes (Signed)
[address]       [place] [address]             [phone]                    CHARLESTON HANKIN [place] Medical Record #[id number] Date of Birth: 1934-04-04  Referring: Katy Apo, MD Primary Care: Katy Apo, MD  Chief Complaint:   lrft lung nodule  History of Present Illness:    [name] 79 y.o. male is seen in the office  today for severe COPD oxygen dependent an incidental finding of a less than 1 cm left lung nodule possibly malignant. The patient's had at least 10 year history of severe COPD oxygen dependent, very limited  Functionally due to shortness of breath. He fell last week and chest x-ray in the emergency room suggested a left lung lesion, CT scan and PET scan have been performed and the patient referred to the [facility] clinic for further evaluation.    Current Activity/ Functional Status:  Patient is independent with mobility/ambulation, transfers, ADL's, IADL's.  Zubrod Score: At the time of surgery this patient's most appropriate activity status/level should be described as: []  Normal activity, no symptoms []  Symptoms, fully ambulatory [x]  Symptoms, in bed less than or equal to 50% of the time []  Symptoms, in bed greater than 50% of the time but less than 100% []  Bedridden []  Moribund   Past Medical History  Diagnosis Date  . Asthma   . COPD (chronic obstructive pulmonary disease)   . Emphysema of lung   . Hypertension     Past Surgical History  Procedure Laterality Date  . Lump removed from neck  2004  . Hernia repair  2008    No family history on file.  History   Social History  . Marital Status: Married    Spouse Name: N/A    Number of Children: N/A  . Years of Education: N/A   Occupational History  . Not on file.   Social History Main Topics  . Smoking status: Former Smoker    Quit date: 09/06/2002  . Smokeless tobacco: Not on file  . Alcohol Use: No  . Drug Use: No  . Sexual Activity: Not  on file   Other Topics Concern  . Not on file   Social History Narrative  . No narrative on file    History  Smoking status  . Former Smoker  . Quit date: 09/06/2002  Smokeless tobacco  . Not on file    History  Alcohol Use No     No Known Allergies  Current Outpatient Prescriptions  Medication Sig Dispense Refill  . albuterol (PROVENTIL) (2.5 MG/3ML) 0.083% nebulizer solution Take 2.5 mg by nebulization every 6 (six) hours as needed for wheezing.      . Fluticasone-Salmeterol (ADVAIR) 250-50 MCG/DOSE AEPB Inhale 1 puff into the lungs every 12 (twelve) hours.      Marland Kitchen losartan (COZAAR) 50 MG tablet Take 50 mg by mouth daily.      . ondansetron (ZOFRAN) 8 MG tablet Take 1 tablet (8 mg total) by mouth every 8 (eight) hours as needed for nausea.  12 tablet  0  . oxyCODONE-acetaminophen (PERCOCET/ROXICET) 5-325 MG per tablet Take 1 tablet by mouth every 6 (six) hours as needed for pain.        No current facility-administered medications for this visit.       Review of Systems:  Cardiac Review of Systems: Y or N  Chest Pain [  n  ]  Resting SOB Cove.Etienne   ] Exertional SOB  [ y ]  Pollyann Kennedy Milo.Brash  ]   Pedal Edema [  n ]    Palpitations [ n ] Syncope  [ n ]   Presyncope [ n  ]  General Review of Systems: [Y] = yes [  ]=no Constitional: recent weight change [  ];  Wt loss over the last 3 months [n   ] anorexia [  ]; fatigue [  ]; nausea [  ]; night sweats [  ]; fever [ n]; or chills [n  ];          Dental: poor dentition[  ]; Last Dentist visit:   Eye : blurred vision [  ]; diplopia [   ]; vision changes [  ];  Amaurosis fugax[  ]; Resp: cough Cove.Etienne  ];  wheezing[y  ];  hemoptysis[n  ]; shortness of breath[ y ]; paroxysmal nocturnal dyspnea[ y ]; dyspnea on exertion[y  ]; or orthopnea[  ];  GI:  Gallstones[known on ct  ], vomiting[  ];  dysphagia[  ]; melena[  ];  hematochezia [  ]; heartburn[  ];   Hx of  Colonoscopy[  ]; GU: kidney stones [  ]; hematuria[  ];   dysuria [  ];  nocturia[   ];  history of     obstruction [  ]; urinary frequency [  ]             Skin: rash, swelling[  ];, hair loss[  ];  peripheral edema[  ];  or itching[  ]; Musculosketetal: myalgias[  ];  joint swelling[  ];  joint erythema[  ];  joint pain[  ];  back pain[  ];  Heme/Lymph: bruising[  ];  bleeding[  ];  anemia[  ];  Neuro: TIA[  ];  headaches[  ];  stroke[  ];  vertigo[  ];  seizures[  ];   paresthesias[  ];  difficulty walking[ sob ];  Psych:depression[  ]; anxiety[  ];  Endocrine: diabetes[  ];  thyroid dysfunction[  ];  Immunizations: Flu up to date Cove.Etienne  ]; Pneumococcal up to date [ y ];  Other:  Physical Exam: BP 153/88  Pulse 105  Temp(Src) 98.1 F (36.7 C)  Resp 17  Ht 6\' 1"  (1.854 m)  Wt 177 lb 4.8 oz (80.423 kg)  BMI 23.40 kg/m2  SpO2 90%    General appearance: alert, cooperative, appears older than stated age, fatigued and mild distress Neurologic: intact Heart: regular rate and rhythm, S1, S2 normal, no murmur, click, rub or gallop Lungs: diminished breath sounds bilaterally Abdomen: soft, non-tender; bowel sounds normal; no masses,  no organomegaly Extremities: extremities normal, atraumatic, no cyanosis or edema and Homans sign is negative, no sign of DVT Patient has no carotid bruits Has no cervical or supraclavicular adenopathy   Diagnostic Studies & Laboratory data:     Recent Radiology Findings:   Ct Chest Wo Contrast  05/22/2013   CLINICAL DATA:  New pulmonary nodule seen on chest x-ray of 05/03/2013. Shortness of breath. COPD.  EXAM: CT CHEST WITHOUT CONTRAST  TECHNIQUE: Multidetector CT imaging of the chest was performed following the standard protocol without IV contrast.  COMPARISON:  Chest x-ray dated 05/03/2013 and chest CT dated 04/18/2010  FINDINGS: There is a new spiculated lobulated 16 x 13 x 9 mm nodule in the region of  the lingula of the left upper lobe. The finding is consistent with a carcinoma of the lung.  The finding is superimposed on severe  emphysema. There are several areas of new oblong nodularity in the left upper lobe which I suspect represent intrapulmonary lymph nodes in areas of new parenchymal scarring.  There is no hilar or mediastinal adenopathy. Heart size is normal. No acute osseous abnormality. The thoracic kyphosis is markedly accentuated.  Scans of the upper abdomen demonstrate multiple gallstones as well as a 11 mm stone in the left renal pelvis. These were present on the prior CT scan abdomen dated 08/28/2012. Minimal dilatation of the left renal collecting system.  IMPRESSION: New spiculated 16 mm mass in the left upper lobe consistent with a carcinoma of the lung.   Electronically Signed   By: Geanie Cooley M.D.   On: 05/22/2013 16:32   Nm Pet Image Initial (pi) Skull Base To Thigh  06/04/2013   CLINICAL DATA:  Initial Treatment strategy for left upper lobe lung lesion. Marland Kitchen  EXAM: NUCLEAR MEDICINE PET SKULL BASE TO THIGH  FASTING BLOOD GLUCOSE:  Value: 92mg /dl  TECHNIQUE: 40.9 mCi W-11 FDG was injected intravenously. CT data was obtained and used for attenuation correction and anatomic localization only. (This was not acquired as a diagnostic CT examination.) Additional exam technical data entered on technologist worksheet.  COMPARISON:  Chest CT 05/22/2013  FINDINGS: NECK  No hypermetabolic lymph nodes in the neck.  CHEST  The lingular spiculated lesion demonstrates FDG uptake with SUV max of 3.6. This is consistent with a primary lung neoplasm. There are several other pulmonary nodules but no definite abnormal FDG uptake. No mediastinal or hilar lymphadenopathy.  ABDOMEN/PELVIS  No abnormal hypermetabolic activity within the liver, pancreas, adrenal glands, or spleen. No hypermetabolic lymph nodes in the abdomen or pelvis.  SKELETON  No findings to suggest osseous metastatic disease. There is slight increased uptake in a right lower anterior with but this has the appearance of a fracture.  IMPRESSION: Lingular nodule demonstrates  neoplastic range FDG uptake with SUV max of 3.6.  No mediastinal or hilar lymphadenopathy and no findings for metastatic disease.  Severe emphysematous changes with pulmonary scarring. No other definite FDG positive pulmonary nodules.   Electronically Signed   By: Loralie Champagne M.D.   On: 06/04/2013 13:12      Recent Lab Findings: Lab Results  Component Value Date   WBC 20.4* 04/19/2010   HGB 16.1 04/19/2010   HCT 47.2 04/19/2010   PLT 165 04/19/2010   GLUCOSE 101* 04/19/2010   ALT 16 04/18/2010   AST 19 04/18/2010   NA 137 04/19/2010   K 3.6 04/19/2010   CL 103 04/19/2010   CREATININE 1.24 04/19/2010   BUN 34* 04/19/2010   CO2 25 04/19/2010   TSH 0.889 04/17/2010      Assessment / Plan:   Patient has severely limiting COPD oxygen dependence and would be a very poor operative candidate. The patient's scans were reviewed in the [facility]  conference this morning. He has multiple left lung nodules, with most predominant one approximately 16 x 9 x 13 mm. The patient has diffuse scarring throughout the lung. It is unclear if this lesion represents multiple areas of scarring fibrosis inflammation or truly a new lung malignancy. The patient is extremely high risk for lung malignancy. I reviewed the scans with him and his family. The patient is reluctant for intervention, in fact his family asked what if he does nothing. I recommended  to him that we repeat the CT scan of the chest in 3 months if we see any particular lesion enlarging in size he can then consider possible needle biopsy and stereotactic radiotherapy if it is malignant. The patient would be a very poor operative risk.     Plan see the patient back in 3 months with a followup CT scan of the chest  I spent 55 minutes counseling the patient face to face. The total time spent in the appointment was 80 minutes.  Delight Ovens MD      [address] [place] [place] [address] Office [phone]   Beeper [phone]  06/06/2013 6:36  PM

## 2013-08-20 ENCOUNTER — Other Ambulatory Visit: Payer: Self-pay | Admitting: *Deleted

## 2013-08-20 DIAGNOSIS — D381 Neoplasm of uncertain behavior of trachea, bronchus and lung: Secondary | ICD-10-CM

## 2013-09-05 ENCOUNTER — Ambulatory Visit: Payer: Medicare Other | Admitting: Cardiothoracic Surgery

## 2013-09-05 ENCOUNTER — Other Ambulatory Visit: Payer: Medicare Other

## 2013-09-12 ENCOUNTER — Encounter (HOSPITAL_COMMUNITY): Payer: Self-pay | Admitting: Emergency Medicine

## 2013-09-12 ENCOUNTER — Observation Stay (HOSPITAL_COMMUNITY)
Admission: EM | Admit: 2013-09-12 | Discharge: 2013-09-13 | Disposition: A | Discharge status: Home / Self Care | Payer: Medicare HMO | Attending: Internal Medicine | Admitting: Internal Medicine

## 2013-09-12 ENCOUNTER — Emergency Department (HOSPITAL_COMMUNITY): Payer: Medicare HMO

## 2013-09-12 DIAGNOSIS — I700 Atherosclerosis of aorta: Secondary | ICD-10-CM | POA: Diagnosis not present

## 2013-09-12 DIAGNOSIS — R918 Other nonspecific abnormal finding of lung field: Secondary | ICD-10-CM | POA: Diagnosis present

## 2013-09-12 DIAGNOSIS — F419 Anxiety disorder, unspecified: Secondary | ICD-10-CM

## 2013-09-12 DIAGNOSIS — I10 Essential (primary) hypertension: Secondary | ICD-10-CM | POA: Insufficient documentation

## 2013-09-12 DIAGNOSIS — Z9981 Dependence on supplemental oxygen: Secondary | ICD-10-CM | POA: Diagnosis not present

## 2013-09-12 DIAGNOSIS — Z79899 Other long term (current) drug therapy: Secondary | ICD-10-CM | POA: Insufficient documentation

## 2013-09-12 DIAGNOSIS — I498 Other specified cardiac arrhythmias: Secondary | ICD-10-CM | POA: Insufficient documentation

## 2013-09-12 DIAGNOSIS — J441 Chronic obstructive pulmonary disease with (acute) exacerbation: Principal | ICD-10-CM | POA: Diagnosis present

## 2013-09-12 DIAGNOSIS — Z87891 Personal history of nicotine dependence: Secondary | ICD-10-CM | POA: Insufficient documentation

## 2013-09-12 DIAGNOSIS — J45901 Unspecified asthma with (acute) exacerbation: Secondary | ICD-10-CM | POA: Diagnosis not present

## 2013-09-12 DIAGNOSIS — J9620 Acute and chronic respiratory failure, unspecified whether with hypoxia or hypercapnia: Secondary | ICD-10-CM

## 2013-09-12 DIAGNOSIS — I447 Left bundle-branch block, unspecified: Secondary | ICD-10-CM | POA: Insufficient documentation

## 2013-09-12 DIAGNOSIS — R0602 Shortness of breath: Secondary | ICD-10-CM | POA: Diagnosis present

## 2013-09-12 DIAGNOSIS — J449 Chronic obstructive pulmonary disease, unspecified: Secondary | ICD-10-CM

## 2013-09-12 DIAGNOSIS — F411 Generalized anxiety disorder: Secondary | ICD-10-CM | POA: Insufficient documentation

## 2013-09-12 LAB — I-STAT TROPONIN, ED: Troponin i, poc: 0.02 ng/mL (ref 0.00–0.08)

## 2013-09-12 LAB — BASIC METABOLIC PANEL
BUN: 21 mg/dL (ref 6–23)
CO2: 25 meq/L (ref 19–32)
CREATININE: 1.07 mg/dL (ref 0.50–1.35)
Calcium: 9.4 mg/dL (ref 8.4–10.5)
Chloride: 104 mEq/L (ref 96–112)
GFR calc Af Amer: 75 mL/min — ABNORMAL LOW (ref >90)
GFR, EST NON AFRICAN AMERICAN: 65 mL/min — AB (ref >90)
GLUCOSE: 114 mg/dL — AB (ref 70–99)
Potassium: 4.2 mEq/L (ref 3.7–5.3)
Sodium: 143 mEq/L (ref 137–147)

## 2013-09-12 LAB — CBC WITH DIFFERENTIAL/PLATELET
BASOS PCT: 1 % (ref 0–1)
Basophils Absolute: 0 10*3/uL (ref 0.0–0.1)
Basophils Absolute: 0.1 10*3/uL (ref 0.0–0.1)
Basophils Relative: 0 % (ref 0–1)
EOS ABS: 0.1 10*3/uL (ref 0.0–0.7)
EOS PCT: 1 % (ref 0–5)
EOS PCT: 0 % (ref 0–5)
Eosinophils Absolute: 0 10*3/uL (ref 0.0–0.7)
HCT: 51.3 % (ref 39.0–52.0)
HEMATOCRIT: 46.2 % (ref 39.0–52.0)
Hemoglobin: 15 g/dL (ref 13.0–17.0)
Hemoglobin: 16.8 g/dL (ref 13.0–17.0)
LYMPHS ABS: 1.1 10*3/uL (ref 0.7–4.0)
LYMPHS ABS: 0.8 10*3/uL (ref 0.7–4.0)
LYMPHS PCT: 11 % — AB (ref 12–46)
Lymphocytes Relative: 15 % (ref 12–46)
MCH: 30.6 pg (ref 26.0–34.0)
MCH: 30.7 pg (ref 26.0–34.0)
MCHC: 32.7 g/dL (ref 30.0–36.0)
MCHC: 32.5 g/dL (ref 30.0–36.0)
MCV: 93.8 fL (ref 78.0–100.0)
MCV: 94.3 fL (ref 78.0–100.0)
MONO ABS: 0.4 10*3/uL (ref 0.1–1.0)
MONO ABS: 0.1 10*3/uL (ref 0.1–1.0)
Monocytes Relative: 2 % — ABNORMAL LOW (ref 3–12)
Monocytes Relative: 5 % (ref 3–12)
NEUTROS PCT: 78 % — AB (ref 43–77)
Neutro Abs: 5.4 10*3/uL (ref 1.7–7.7)
Neutro Abs: 6.4 10*3/uL (ref 1.7–7.7)
Neutrophils Relative %: 87 % — ABNORMAL HIGH (ref 43–77)
PLATELETS: 136 10*3/uL — AB (ref 150–400)
Platelets: 124 10*3/uL — ABNORMAL LOW (ref 150–400)
RBC: 4.9 MIL/uL (ref 4.22–5.81)
RBC: 5.47 MIL/uL (ref 4.22–5.81)
RDW: 14 % (ref 11.5–15.5)
RDW: 14.2 % (ref 11.5–15.5)
WBC: 7.3 10*3/uL (ref 4.0–10.5)
WBC: 7.1 10*3/uL (ref 4.0–10.5)

## 2013-09-12 LAB — BLOOD GAS, ARTERIAL
ACID-BASE DEFICIT: 1.4 mmol/L (ref 0.0–2.0)
Bicarbonate: 22.8 mEq/L (ref 20.0–24.0)
Drawn by: 257701
O2 CONTENT: 3 L/min
O2 SAT: 95 %
Patient temperature: 98.6
TCO2: 19.9 mmol/L (ref 0–100)
pCO2 arterial: 38.8 mmHg (ref 35.0–45.0)
pH, Arterial: 7.387 (ref 7.350–7.450)
pO2, Arterial: 73.2 mmHg — ABNORMAL LOW (ref 80.0–100.0)

## 2013-09-12 MED ORDER — MORPHINE SULFATE 2 MG/ML IJ SOLN: 1.0000 mg | INTRAMUSCULAR | Status: DC | PRN

## 2013-09-12 MED ORDER — ACETAMINOPHEN 325 MG PO TABS: 650.0000 mg | ORAL_TABLET | Freq: Four times a day (QID) | ORAL | Status: DC | PRN

## 2013-09-12 MED ORDER — LORAZEPAM 2 MG/ML IJ SOLN
0.5000 mg | INTRAMUSCULAR | Status: DC | PRN
  Administered 2013-09-13: 0.5 mg via INTRAVENOUS
  Filled 2013-09-12: qty 1

## 2013-09-12 MED ORDER — LEVOFLOXACIN IN D5W 750 MG/150ML IV SOLN
750.0000 mg | INTRAVENOUS | Status: DC
  Administered 2013-09-12: 750 mg via INTRAVENOUS
  Filled 2013-09-12: qty 150

## 2013-09-12 MED ORDER — SODIUM CHLORIDE 0.9 % IJ SOLN
3.0000 mL | Freq: Two times a day (BID) | INTRAMUSCULAR | Status: DC
  Administered 2013-09-12: 3 mL via INTRAVENOUS

## 2013-09-12 MED ORDER — ALUM & MAG HYDROXIDE-SIMETH 200-200-20 MG/5ML PO SUSP: 30.0000 mL | Freq: Four times a day (QID) | ORAL | Status: DC | PRN

## 2013-09-12 MED ORDER — SODIUM CHLORIDE 0.9 % IV BOLUS (SEPSIS)
500.0000 mL | Freq: Once | INTRAVENOUS | Status: AC
  Administered 2013-09-12: 500 mL via INTRAVENOUS

## 2013-09-12 MED ORDER — ALPRAZOLAM 0.5 MG PO TABS
0.5000 mg | ORAL_TABLET | Freq: Once | ORAL | Status: AC
  Administered 2013-09-12: 0.5 mg via ORAL
  Filled 2013-09-12: qty 1

## 2013-09-12 MED ORDER — OXYCODONE-ACETAMINOPHEN 5-325 MG PO TABS
1.0000 | ORAL_TABLET | Freq: Once | ORAL | Status: AC
  Administered 2013-09-12: 1 via ORAL
  Filled 2013-09-12: qty 1

## 2013-09-12 MED ORDER — METHYLPREDNISOLONE SODIUM SUCC 40 MG IJ SOLR
40.0000 mg | Freq: Four times a day (QID) | INTRAMUSCULAR | Status: DC
  Administered 2013-09-12 – 2013-09-13 (×3): 40 mg via INTRAVENOUS
  Filled 2013-09-12 (×7): qty 1

## 2013-09-12 MED ORDER — IPRATROPIUM-ALBUTEROL 0.5-2.5 (3) MG/3ML IN SOLN
3.0000 mL | Freq: Once | RESPIRATORY_TRACT | Status: AC
  Administered 2013-09-12: 3 mL via RESPIRATORY_TRACT
  Filled 2013-09-12: qty 3

## 2013-09-12 MED ORDER — IOHEXOL 350 MG/ML SOLN
100.0000 mL | Freq: Once | INTRAVENOUS | Status: AC | PRN
  Administered 2013-09-12: 100 mL via INTRAVENOUS

## 2013-09-12 MED ORDER — ONDANSETRON HCL 4 MG PO TABS: 4.0000 mg | ORAL_TABLET | Freq: Four times a day (QID) | ORAL | Status: DC | PRN

## 2013-09-12 MED ORDER — MOMETASONE FURO-FORMOTEROL FUM 100-5 MCG/ACT IN AERO
2.0000 | INHALATION_SPRAY | Freq: Two times a day (BID) | RESPIRATORY_TRACT | Status: DC
  Administered 2013-09-12 – 2013-09-13 (×2): 2 via RESPIRATORY_TRACT
  Filled 2013-09-12: qty 8.8

## 2013-09-12 MED ORDER — LOSARTAN POTASSIUM 50 MG PO TABS
50.0000 mg | ORAL_TABLET | Freq: Every day | ORAL | Status: DC
  Administered 2013-09-12 – 2013-09-13 (×2): 50 mg via ORAL
  Filled 2013-09-12 (×2): qty 1

## 2013-09-12 MED ORDER — SODIUM CHLORIDE 0.9 % IV SOLN: 250.0000 mL | INTRAVENOUS | Status: DC | PRN

## 2013-09-12 MED ORDER — PANTOPRAZOLE SODIUM 40 MG PO TBEC
40.0000 mg | DELAYED_RELEASE_TABLET | Freq: Every day | ORAL | Status: DC
  Administered 2013-09-12 – 2013-09-13 (×2): 40 mg via ORAL
  Filled 2013-09-12: qty 1

## 2013-09-12 MED ORDER — ONDANSETRON HCL 4 MG/2ML IJ SOLN: 4.0000 mg | Freq: Four times a day (QID) | INTRAMUSCULAR | Status: DC | PRN

## 2013-09-12 MED ORDER — OXYCODONE-ACETAMINOPHEN 5-325 MG PO TABS
0.5000 | ORAL_TABLET | Freq: Four times a day (QID) | ORAL | Status: DC | PRN
  Administered 2013-09-13 (×2): 1 via ORAL
  Filled 2013-09-12 (×2): qty 1

## 2013-09-12 MED ORDER — SODIUM CHLORIDE 0.9 % IJ SOLN: 3.0000 mL | INTRAMUSCULAR | Status: DC | PRN

## 2013-09-12 MED ORDER — IPRATROPIUM-ALBUTEROL 0.5-2.5 (3) MG/3ML IN SOLN
3.0000 mL | RESPIRATORY_TRACT | Status: DC
  Administered 2013-09-12: 3 mL via RESPIRATORY_TRACT
  Filled 2013-09-12: qty 3

## 2013-09-12 MED ORDER — ACETAMINOPHEN 650 MG RE SUPP: 650.0000 mg | Freq: Four times a day (QID) | RECTAL | Status: DC | PRN

## 2013-09-12 MED ORDER — ENOXAPARIN SODIUM 40 MG/0.4ML ~~LOC~~ SOLN
40.0000 mg | SUBCUTANEOUS | Status: DC
  Administered 2013-09-12: 40 mg via SUBCUTANEOUS
  Filled 2013-09-12 (×2): qty 0.4

## 2013-09-12 MED ORDER — ALBUTEROL SULFATE HFA 108 (90 BASE) MCG/ACT IN AERS
2.0000 | INHALATION_SPRAY | Freq: Once | RESPIRATORY_TRACT | Status: AC
  Administered 2013-09-12: 2 via RESPIRATORY_TRACT
  Filled 2013-09-12: qty 6.7

## 2013-09-12 NOTE — ED Notes (Signed)
Pt given meal per request with verbal ok from EDP.  Tolerating well.  Pt awaiting hospital admit.
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
## 2013-09-12 NOTE — ED Notes (Signed)
Pt arrived via EMS when he had SOB last night. Nothing was helping with home tx. Called EMS this AM. Diminished lower and wheezing in upper. Albuterol 5mg  neb and 125mg  Solumedrol given by EMS. Pt has 20ga LAC by EMS. Pt is on 02 3L at home. Denies any pain, n/v.

## 2013-09-12 NOTE — ED Notes (Signed)
Pt transported to floor at this time by med tech, nad upon leaving dept.

## 2013-09-12 NOTE — ED Notes (Signed)
Dr. Regenia Skeeter aware of infiltrated IV, per verbal order IV access not re-obtained at this time.  Pt pending d/c home.

## 2013-09-12 NOTE — H&P (Signed)
[facility] History and Physical  BLASE BATLEY [id number] DOB: 04/23/1934 DOA: 09/12/2013  Referring physician:  PCP: Kandice Hams, MD   Chief Complaint: Shortness of breath  HPI: [name] is a 80 y.o. male with advanced chronic obstructive pulmonary disease, chronic hypoxemic respiratory failure who requires 3 L supplemental oxygen at baseline, history of pulmonary nodules, presenting to the emergency room at [facility] with complaints of shortness of breath. Patient states that he has become progressively short of breath over the past 2 weeks, worsening in the last 24 hours. Shortness of breath is accompanied by cough as well as white sputum production. He denies associated fevers, chills or recent travel or sick contacts. Patient has needed to increase his oxygen from 3 L at baseline to 5 L via nasal cannula. He also reports having increased anxiety which he feels dyspnea precipitating panic attacks. In the emergency room, he was found to be in respiratory distress which improved after the administration of nebulizers. A CT scan of lungs with IV contrast showed emphysematous changes with no evidence of pulmonary embolism. Noted on CT were stable pulmonary nodules. He also received Xanax in the emergency room which he stated significantly helped with his anxiety and breathing.                                              Review of Systems:  Constitutional:  No weight loss, night sweats, Fevers, chills, fatigue.  HEENT:  No headaches, Difficulty swallowing,Tooth/dental problems,Sore throat,  No sneezing, itching, ear ache, nasal congestion, post nasal drip,  Cardio-vascular:  No chest pain, Orthopnea, PND, swelling in lower extremities, anasarca, dizziness, palpitations  GI:  No heartburn, indigestion, abdominal pain, nausea, vomiting, diarrhea, change in bowel habits, loss of appetite  Resp:  Positive for shortness of breath with exertion or at rest with  associated cough and clear sputum production No non-productive cough, No coughing up of blood.No change in color of mucus.No wheezing.No chest wall deformity  Skin:  no rash or lesions.  GU:  no dysuria, change in color of urine, no urgency or frequency. No flank pain.  Musculoskeletal:  No joint pain or swelling. No decreased range of motion. No back pain.  Psych:  No change in mood or affect. No depression or anxiety. No memory loss. Positive for anxiety  Past Medical History  Diagnosis Date  . Asthma   . COPD (chronic obstructive pulmonary disease)   . Emphysema of lung   . Hypertension    Past Surgical History  Procedure Laterality Date  . Lump removed from neck  2004  . Hernia repair  2008   Social History:  reports that he quit smoking about 11 years ago. He has never used smokeless tobacco. He reports that he does not drink alcohol or use illicit drugs.  No Known Allergies  History reviewed. No pertinent family history.   Prior to Admission medications   Medication Sig Start Date End Date Taking? Authorizing Provider  albuterol (PROVENTIL HFA;VENTOLIN HFA) 108 (90 BASE) MCG/ACT inhaler Inhale 2 puffs into the lungs every 6 (six) hours as needed for wheezing or shortness of breath.   Yes Historical Provider, MD  albuterol (PROVENTIL) (2.5 MG/3ML) 0.083% nebulizer solution Take 2.5 mg by nebulization every 6 (six) hours as needed for wheezing.   Yes Historical Provider, MD  Fluticasone-Salmeterol (ADVAIR) 250-50 MCG/DOSE  AEPB Inhale 1 puff into the lungs every 12 (twelve) hours.   Yes Historical Provider, MD  losartan (COZAAR) 50 MG tablet Take 50 mg by mouth daily.   Yes Historical Provider, MD  ondansetron (ZOFRAN) 8 MG tablet Take 1 tablet (8 mg total) by mouth every 8 (eight) hours as needed for nausea. 05/03/13  Yes Orlie Dakin, MD  oxyCODONE-acetaminophen (PERCOCET/ROXICET) 5-325 MG per tablet Take 0.5-1 tablets by mouth every 6 (six) hours as needed for pain.    Yes  Historical Provider, MD   Physical Exam: Filed Vitals:   09/12/13 1430  BP: 131/54  Pulse: 99  Temp:   Resp: 15    BP 131/54  Pulse 99  Temp(Src) 98.2 F (36.8 C) (Oral)  Resp 15  SpO2 98%  General:  Patient appears dyspneic at rest, presently on 5 L supplemental oxygen, he does not appear to be using accessory muscles during my encounter Eyes: PERRL, normal lids, irises & conjunctiva ENT: grossly normal hearing, lips & tongue Neck: no LAD, masses or thyromegaly Cardiovascular: RRR, no m/r/g. No LE edema. Tachycardic Telemetry: SR, no arrhythmias, tachycardia Respiratory: Severely diminished breath sounds bilaterally, having a few bilateral expiratory wheezes, I did not auscultate crackles or rhonchi Abdomen: soft, ntnd Skin: no rash or induration seen on limited exam Musculoskeletal: grossly normal tone BUE/BLE Psychiatric: grossly normal mood and affect, speech fluent and appropriate Neurologic: grossly non-focal.          Labs on Admission:  Basic Metabolic Panel:  Recent Labs Lab 09/12/13 1120  NA 143  K 4.2  CL 104  CO2 25  GLUCOSE 114*  BUN 21  CREATININE 1.07  CALCIUM 9.4   Liver Function Tests: No results found for this basename: AST, ALT, ALKPHOS, BILITOT, PROT, ALBUMIN,  in the last 168 hours No results found for this basename: LIPASE, AMYLASE,  in the last 168 hours No results found for this basename: AMMONIA,  in the last 168 hours CBC:  Recent Labs Lab 09/12/13 1050 09/12/13 1120  WBC 7.1 7.3  NEUTROABS 5.4 6.4  HGB 16.8 15.0  HCT 51.3 46.2  MCV 93.8 94.3  PLT 136* 124*   Cardiac Enzymes: No results found for this basename: CKTOTAL, CKMB, CKMBINDEX, TROPONINI,  in the last 168 hours  BNP (last 3 results) No results found for this basename: PROBNP,  in the last 8760 hours CBG: No results found for this basename: GLUCAP,  in the last 168 hours  Radiological Exams on Admission: Dg Chest 2 View (if Patient Has Fever And/or  Copd)  09/12/2013   CLINICAL DATA:  Shortness of breath and cough.  EXAM: CHEST  2 VIEW  COMPARISON:  NM PET IMAGE INITIAL (PI) SKULL BASE TO THIGH dated 06/04/2013; CT CHEST W/O CM dated 05/22/2013; DG CHEST 2 VIEW dated 05/03/2013  FINDINGS: Mediastinum and hilar structures are normal. Severe interstitial prominence and bullous change noted consistent pulmonary interstitial fibrosis and COPD. Previously identified left mid lung zone nodule is not well identified on today's examination, reference made to a prior reports. No pneumothorax. No acute bony abnormality .  IMPRESSION: 1. 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 2. Previously identified nodule in the left mid lung field is not well identified on today's examination. This may be secondary to projection. Reference is made to prior reports. If need be for further evaluation repeat CT or PET-CT can be obtained.   Electronically Signed   By: Marcello Moores  Register   On: 09/12/2013 10:03  Ct Angio Chest Pe W/cm &/or Wo Cm  09/12/2013   CLINICAL DATA:  Shortness of breath, question pulmonary embolism, history emphysema, asthma, COPD, hypertension, former smoker  EXAM: CT ANGIOGRAPHY CHEST WITH CONTRAST  TECHNIQUE: Multidetector CT imaging of the chest was performed using the standard protocol during bolus administration of intravenous contrast. Multiplanar CT image reconstructions and MIPs were obtained to evaluate the vascular anatomy.  CONTRAST:  167mL OMNIPAQUE IOHEXOL 350 MG/ML SOLN  COMPARISON:  05/22/2013  FINDINGS: Atherosclerotic calcifications aorta without aneurysm or dissection.  Pulmonary arteries well opacified with scattered respiratory motion artifacts at the lower lobes.  No definite evidence of pulmonary embolism.  Visualized upper abdomen unremarkable.  No thoracic adenopathy.  Severe emphysematous changes with minimal atelectasis in the right middle and right lower lobes.  Minimal interstitial changes at the right base.  Small  focus of atelectasis or scarring in left lower lobe image 46.  4 mm diameter lung nodules left apex image 12 and lingula image 35 unchanged.  5 mm lingular nodule image 44 stable.  No acute infiltrate, pleural effusion or pneumothorax.  Bones diffusely demineralized.  Review of the MIP images confirms the above findings.  IMPRESSION: Severe COPD changes with scattered scarring and stable left lung nodules.  No evidence of pulmonary embolism or definite acute intra thoracic process.   Electronically Signed   By: Lavonia Dana M.D.   On: 09/12/2013 13:55    EKG: Independently reviewed.   Assessment/Plan Principal Problem:   COPD exacerbation Active Problems:   Advanced COPD   Acute on chronic respiratory failure   Abnormal chest x-ray with multiple lung nodules   1. Chronic obstructive pulmonary disease exacerbation. Patient presenting with complaints of increasing shortness of breath associated with cough progressively worse over the past 2 weeks. Symptoms appeared to improve after the demonstration of nebulizers in the emergency room. Will start patient on IV steroids with Solu-Medrol 40 mg IV every 6 hours, scheduled duo nebs every 4 hours, start empiric IV antibiotic therapy with Levaquin. Provide supportive care, close monitoring with telemetry. 2. Acute on chronic hypoxemic respiratory failure evidence by an increased oxygen requirement, patient required 5 L of supplemental oxygen via nasal cannula in the emergency department. He states that at baseline he normally uses 3 L at home. Likely secondary to COPD exacerbation. Starting IV steroids, duo nebs and antibiotics. 3. History of lung nodules. Patient having a CT scan of lungs with IV contrast in the emergency room which demonstrated stable pulmonary nodules, when compared to prior studies. 4. Generalized anxiety disorder. I think this is likely aggravated by patient's dyspnea, we'll start him on benzodiazepine therapy with Ativan 0.5 mg every 4  hours as needed. 5. DVT prophylaxis. Lovenox subcutaneous    Code Status: I discussed CODE STATUS with patient, for now he wishes to BE a full code until he speaks with his wife and son. He feels that he probably would not want to undergo heroic measures in the event of cardiopulmonary arrest however wishes to discuss with family members first. Family Communication: Family members were not present in room during my encounter Disposition Plan: Patient much improved after the administration of duo nebs, will place him in overnight observation, do not anticipate him requiring greater than 2 night hospitalization  Time spent: 55 minutes  Kelvin Cellar [facility] Pager [phone]

## 2013-09-12 NOTE — ED Notes (Signed)
Dr. Regenia Skeeter made aware pt asking for percocet for HA.

## 2013-09-12 NOTE — ED Notes (Addendum)
Pt ret from CT, med per [facility].  Resting on stretcher, family remains at bs.  Pt denies further needs/complaints at this time.  NAD. EDP aware of VS.

## 2013-09-12 NOTE — ED Notes (Signed)
Bed: WA06 Expected date:  Expected time:  Means of arrival:  Comments: EMS-SOB 

## 2013-09-12 NOTE — ED Notes (Signed)
Initial Contact - pt resting on stretcher with family at bs.  Pt denies complaints at this time and reports feeling better.  Speaking full/clear sentences, rr even/un-lab.  Pt denies CP/SOB.  Skin PWD.  MAEI.  NAD.  Awaiting dispo.

## 2013-09-13 LAB — CBC
HCT: 42.8 % (ref 39.0–52.0)
HEMOGLOBIN: 13.8 g/dL (ref 13.0–17.0)
MCH: 30.1 pg (ref 26.0–34.0)
MCHC: 32.2 g/dL (ref 30.0–36.0)
MCV: 93.2 fL (ref 78.0–100.0)
Platelets: 122 10*3/uL — ABNORMAL LOW (ref 150–400)
RBC: 4.59 MIL/uL (ref 4.22–5.81)
RDW: 14.3 % (ref 11.5–15.5)
WBC: 7.9 10*3/uL (ref 4.0–10.5)

## 2013-09-13 LAB — BASIC METABOLIC PANEL
BUN: 27 mg/dL — AB (ref 6–23)
CALCIUM: 9.2 mg/dL (ref 8.4–10.5)
CO2: 24 meq/L (ref 19–32)
Chloride: 105 mEq/L (ref 96–112)
Creatinine, Ser: 1.16 mg/dL (ref 0.50–1.35)
GFR calc Af Amer: 68 mL/min — ABNORMAL LOW (ref >90)
GFR, EST NON AFRICAN AMERICAN: 59 mL/min — AB (ref >90)
Glucose, Bld: 134 mg/dL — ABNORMAL HIGH (ref 70–99)
Potassium: 4.2 mEq/L (ref 3.7–5.3)
Sodium: 140 mEq/L (ref 137–147)

## 2013-09-13 MED ORDER — PREDNISONE 20 MG PO TABS
40.0000 mg | ORAL_TABLET | Freq: Two times a day (BID) | ORAL | Status: DC
  Administered 2013-09-13: 40 mg via ORAL
  Filled 2013-09-13 (×2): qty 2

## 2013-09-13 MED ORDER — PREDNISONE 10 MG PO TABS: ORAL_TABLET | ORAL | Status: DC

## 2013-09-13 MED ORDER — ALPRAZOLAM 0.5 MG PO TABS: 0.5000 mg | ORAL_TABLET | Freq: Every evening | ORAL | Status: DC | PRN

## 2013-09-13 MED ORDER — IPRATROPIUM-ALBUTEROL 0.5-2.5 (3) MG/3ML IN SOLN
3.0000 mL | Freq: Four times a day (QID) | RESPIRATORY_TRACT | Status: DC
  Administered 2013-09-13: 3 mL via RESPIRATORY_TRACT
  Filled 2013-09-13 (×2): qty 3

## 2013-09-13 NOTE — Discharge Summary (Addendum)
Physician Discharge Summary  Patient ID: [name] MRN: [id number] DOB/AGE: [birth year] 78 y.o.  Admit date: 09/12/2013 Discharge date: 09/13/2013  Admission Diagnoses:  Discharge Diagnoses:  Principal Problem:   COPD exacerbation Active Problems:   Abnormal chest x-ray with multiple lung nodules   Advanced COPD, oxygen dependent, 3 L   Acute on chronic respiratory failure Anxiety secondary to severe COPD Chronic pain  Discharged Condition: stable  Hospital Course:  Patient presented to emergency room with respiratory distress. Symptoms rapidly improved after albuterol treatment. Patient underwent CT of the chest negative for pulmonary embolism or pneumonia.because of his known COPD admission was deemed necessary for further evaluation and treatment. At the time of rounding patient states he was at his baseline and felt like he may have had a panic attack. He became very worried about the bad inclement weather and he states his electricity went off at once. He states there is no problem at home now he does have electricity. He is oxygen dependent at baseline 3 L. Currently he is in no respiratory distress, he's on his baseline level of oxygen. We've discussed continuing a prednisone taper and given him when necessary anxiolytic. At this time no clear indication for abx.  Consults:    Significant Diagnostic Studies:Dg Chest 2 View (if Patient Has Fever And/or Copd)  09/12/2013   CLINICAL DATA:  Shortness of breath and cough.  EXAM: CHEST  2 VIEW  COMPARISON:  NM PET IMAGE INITIAL (PI) SKULL BASE TO THIGH dated 06/04/2013; CT CHEST W/O CM dated 05/22/2013; DG CHEST 2 VIEW dated 05/03/2013  FINDINGS: Mediastinum and hilar structures are normal. Severe interstitial prominence and bullous change noted consistent pulmonary interstitial fibrosis and COPD. Previously identified left mid lung zone nodule is not well identified on today's examination, reference made to a prior reports. No  pneumothorax. No acute bony abnormality .  IMPRESSION: 1. 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 2. Previously identified nodule in the left mid lung field is not well identified on today's examination. This may be secondary to projection. Reference is made to prior reports. If need be for further evaluation repeat CT or PET-CT can be obtained.   Electronically Signed   By: Marcello Moores  Register   On: 09/12/2013 10:03   Ct Angio Chest Pe W/cm &/or Wo Cm  09/12/2013   CLINICAL DATA:  Shortness of breath, question pulmonary embolism, history emphysema, asthma, COPD, hypertension, former smoker  EXAM: CT ANGIOGRAPHY CHEST WITH CONTRAST  TECHNIQUE: Multidetector CT imaging of the chest was performed using the standard protocol during bolus administration of intravenous contrast. Multiplanar CT image reconstructions and MIPs were obtained to evaluate the vascular anatomy.  CONTRAST:  160mL OMNIPAQUE IOHEXOL 350 MG/ML SOLN  COMPARISON:  05/22/2013  FINDINGS: Atherosclerotic calcifications aorta without aneurysm or dissection.  Pulmonary arteries well opacified with scattered respiratory motion artifacts at the lower lobes.  No definite evidence of pulmonary embolism.  Visualized upper abdomen unremarkable.  No thoracic adenopathy.  Severe emphysematous changes with minimal atelectasis in the right middle and right lower lobes.  Minimal interstitial changes at the right base.  Small focus of atelectasis or scarring in left lower lobe image 46.  4 mm diameter lung nodules left apex image 12 and lingula image 35 unchanged.  5 mm lingular nodule image 44 stable.  No acute infiltrate, pleural effusion or pneumothorax.  Bones diffusely demineralized.  Review of the MIP images confirms the above findings.  IMPRESSION: Severe COPD changes with scattered scarring and  stable left lung nodules.  No evidence of pulmonary embolism or definite acute intra thoracic process.   Electronically Signed   By: Lavonia Dana M.D.    On: 09/12/2013 13:55      Discharge Exam: Blood pressure 110/60, pulse 74, temperature 98 F (36.7 C), temperature source Oral, resp. rate 18, height 5\' 11"  (1.803 m), weight 80.9 kg (178 lb 5.6 oz), SpO2 95.00%. General appearance: alert and cooperative Resp: decreased movement bilateral, . No expiratory wheeze Cardio: regular rate and rhythm Extremities: extremities normal, atraumatic, no cyanosis or edema  Disposition: 01-Home or Self Care   Future Appointments Provider Department Dept Phone   09/26/2013 12:40 PM [facility] [phone]   Liquids only 4 hours prior to your exam. Any medications can be taken as usual. Please arrive 15 min prior to your scheduled exam time.   09/26/2013 1:30 PM Grace Isaac, MD [facility] [phone]       Medication List         albuterol (2.5 MG/3ML) 0.083% nebulizer solution  Commonly known as:  PROVENTIL  Take 2.5 mg by nebulization every 6 (six) hours as needed for wheezing.     albuterol 108 (90 BASE) MCG/ACT inhaler  Commonly known as:  PROVENTIL HFA;VENTOLIN HFA  Inhale 2 puffs into the lungs every 6 (six) hours as needed for wheezing or shortness of breath.     ALPRAZolam 0.5 MG tablet  Commonly known as:  XANAX  Take 1 tablet (0.5 mg total) by mouth at bedtime as needed for anxiety.     Fluticasone-Salmeterol 250-50 MCG/DOSE Aepb  Commonly known as:  ADVAIR  Inhale 1 puff into the lungs every 12 (twelve) hours.     losartan 50 MG tablet  Commonly known as:  COZAAR  Take 50 mg by mouth daily.     ondansetron 8 MG tablet  Commonly known as:  ZOFRAN  Take 1 tablet (8 mg total) by mouth every 8 (eight) hours as needed for nausea.     oxyCODONE-acetaminophen 5-325 MG per tablet  Commonly known as:  PERCOCET/ROXICET  Take 0.5-1 tablets by mouth every 6 (six) hours as needed for pain.     predniSONE 10 MG tablet  Commonly known as:   DELTASONE  - 4 bid x 2 days then  - 4 qd x 2 days then  - 3 qd x 2days then  - 2 qd x 2 days then  - 1 qd x 2 days then stop           Follow-up Information   Follow up with Abou Sterkel D, MD In 1 week.   Specialty:  Internal Medicine   Contact information:   [address] [place] [address] [phone]       Signed: Kandice Hams 09/13/2013, 12:49 PM

## 2013-09-13 NOTE — Care Management Note (Signed)
    Page 1 of 1   09/13/2013     1:46:15 PM   CARE MANAGEMENT NOTE 09/13/2013  Patient:  [name], [name]   Account Number:  [id number]  Date Initiated:  09/13/2013  Documentation initiated by:  Dessa Phi  Subjective/Objective Assessment:   80 Y/O M ADMITTED W/COPD EXAC.     Action/Plan:   FROM HOME.HAS PCP,PHARMACY.   Anticipated DC Date:  09/13/2013   Anticipated DC Plan:  [place]  CM consult      Choice offered to / List presented to:             Status of service:  Completed, signed off Medicare Important Message given?   (If response is "NO", the following Medicare IM given date fields will be blank) Date Medicare IM given:   Date Additional Medicare IM given:    Discharge Disposition:  HOME/SELF CARE  Per UR Regulation:  Reviewed for med. necessity/level of care/duration of stay  If discussed at [place] of Stay Meetings, dates discussed:    Comments:  09/13/13 Elis Rawlinson RN,BSN NCM [phone] D/C HOME NO [place].

## 2013-09-13 NOTE — Progress Notes (Signed)
Subjective: Patient is at his baseline. He states he felt like he was having panic attacks because of the bad weather and his electricity went out. He denies any fever chills no production of colored mucus. He had rapid improvement in his symptoms in the emergency room after albuterol. CT of the chest negative for PE. No pneumonia. He states he thinks he is ready to go home as long as he has something for anxiety  Objective: Vital signs in last 24 hours: Temp:  [97.2 F (36.2 C)-98 F (36.7 C)] 98 F (36.7 C) (02/27 0550) Pulse Rate:  [74-121] 74 (02/27 0550) Resp:  [15-27] 18 (02/27 0550) BP: (110-161)/(52-79) 110/60 mmHg (02/27 0550) SpO2:  [92 %-99 %] 95 % (02/27 0853) Weight:  [80.9 kg (178 lb 5.6 oz)] 80.9 kg (178 lb 5.6 oz) (02/26 1654) Weight change:  Last BM Date: 09/12/13  Intake/Output from previous day: 02/26 0701 - 02/27 0700 In: -  Out: 500 [Urine:500] Intake/Output this shift: Total I/O In: 120 [P.O.:120] Out: -   General appearance: alert and cooperative Resp: moderate air movement without rales wheezes or rhonchi Cardio: regular rate and rhythm, S1, S2 normal, no murmur, click, rub or gallop Extremities: extremities normal, atraumatic, no cyanosis or edema  Lab Results:  Results for orders placed during the hospital encounter of 09/12/13 (from the past 24 hour(s))  CBC WITH DIFFERENTIAL     Status: Abnormal   Collection Time    09/12/13 10:50 AM      Result Value Ref Range   WBC 7.1  4.0 - 10.5 K/uL   RBC 5.47  4.22 - 5.81 MIL/uL   Hemoglobin 16.8  13.0 - 17.0 g/dL   HCT 51.3  39.0 - 52.0 %   MCV 93.8  78.0 - 100.0 fL   MCH 30.7  26.0 - 34.0 pg   MCHC 32.7  30.0 - 36.0 g/dL   RDW 14.0  11.5 - 15.5 %   Platelets 136 (*) 150 - 400 K/uL   Neutrophils Relative % 78 (*) 43 - 77 %   Lymphocytes Relative 15  12 - 46 %   Monocytes Relative 5  3 - 12 %   Eosinophils Relative 1  0 - 5 %   Basophils Relative 1  0 - 1 %   Neutro Abs 5.4  1.7 - 7.7 K/uL    Lymphs Abs 1.1  0.7 - 4.0 K/uL   Monocytes Absolute 0.4  0.1 - 1.0 K/uL   Eosinophils Absolute 0.1  0.0 - 0.7 K/uL   Basophils Absolute 0.1  0.0 - 0.1 K/uL   WBC Morphology WHITE COUNT CONFIRMED ON SMEAR     Smear Review LARGE PLATELETS PRESENT    I-STAT TROPOININ, ED     Status: None   Collection Time    09/12/13 10:57 AM      Result Value Ref Range   Troponin i, poc 0.02  0.00 - 0.08 ng/mL   Comment 3           CBC WITH DIFFERENTIAL     Status: Abnormal   Collection Time    09/12/13 11:20 AM      Result Value Ref Range   WBC 7.3  4.0 - 10.5 K/uL   RBC 4.90  4.22 - 5.81 MIL/uL   Hemoglobin 15.0  13.0 - 17.0 g/dL   HCT 46.2  39.0 - 52.0 %   MCV 94.3  78.0 - 100.0 fL   MCH 30.6  26.0 - 34.0  pg   MCHC 32.5  30.0 - 36.0 g/dL   RDW 14.2  11.5 - 15.5 %   Platelets 124 (*) 150 - 400 K/uL   Neutrophils Relative % 87 (*) 43 - 77 %   Neutro Abs 6.4  1.7 - 7.7 K/uL   Lymphocytes Relative 11 (*) 12 - 46 %   Lymphs Abs 0.8  0.7 - 4.0 K/uL   Monocytes Relative 2 (*) 3 - 12 %   Monocytes Absolute 0.1  0.1 - 1.0 K/uL   Eosinophils Relative 0  0 - 5 %   Eosinophils Absolute 0.0  0.0 - 0.7 K/uL   Basophils Relative 0  0 - 1 %   Basophils Absolute 0.0  0.0 - 0.1 K/uL  BASIC METABOLIC PANEL     Status: Abnormal   Collection Time    09/12/13 11:20 AM      Result Value Ref Range   Sodium 143  137 - 147 mEq/L   Potassium 4.2  3.7 - 5.3 mEq/L   Chloride 104  96 - 112 mEq/L   CO2 25  19 - 32 mEq/L   Glucose, Bld 114 (*) 70 - 99 mg/dL   BUN 21  6 - 23 mg/dL   Creatinine, Ser 1.07  0.50 - 1.35 mg/dL   Calcium 9.4  8.4 - 10.5 mg/dL   GFR calc non Af Amer 65 (*) >90 mL/min   GFR calc Af Amer 75 (*) >90 mL/min  BLOOD GAS, ARTERIAL     Status: Abnormal   Collection Time    09/12/13  6:08 PM      Result Value Ref Range   O2 Content 3.0     Delivery systems NASAL CANNULA     pH, Arterial 7.387  7.350 - 7.450   pCO2 arterial 38.8  35.0 - 45.0 mmHg   pO2, Arterial 73.2 (*) 80.0 - 100.0 mmHg    Bicarbonate 22.8  20.0 - 24.0 mEq/L   TCO2 19.9  0 - 100 mmol/L   Acid-base deficit 1.4  0.0 - 2.0 mmol/L   O2 Saturation 95.0     Patient temperature 98.6     Collection site RIGHT RADIAL     Drawn by [id number]     Sample type ARTERIAL DRAW     Allens test (pass/fail) PASS  PASS  BASIC METABOLIC PANEL     Status: Abnormal   Collection Time    09/13/13  4:05 AM      Result Value Ref Range   Sodium 140  137 - 147 mEq/L   Potassium 4.2  3.7 - 5.3 mEq/L   Chloride 105  96 - 112 mEq/L   CO2 24  19 - 32 mEq/L   Glucose, Bld 134 (*) 70 - 99 mg/dL   BUN 27 (*) 6 - 23 mg/dL   Creatinine, Ser 1.16  0.50 - 1.35 mg/dL   Calcium 9.2  8.4 - 10.5 mg/dL   GFR calc non Af Amer 59 (*) >90 mL/min   GFR calc Af Amer 68 (*) >90 mL/min  CBC     Status: Abnormal   Collection Time    09/13/13  4:05 AM      Result Value Ref Range   WBC 7.9  4.0 - 10.5 K/uL   RBC 4.59  4.22 - 5.81 MIL/uL   Hemoglobin 13.8  13.0 - 17.0 g/dL   HCT 42.8  39.0 - 52.0 %   MCV 93.2  78.0 - 100.0 fL  MCH 30.1  26.0 - 34.0 pg   MCHC 32.2  30.0 - 36.0 g/dL   RDW 14.3  11.5 - 15.5 %   Platelets 122 (*) 150 - 400 K/uL      Studies/Results: Dg Chest 2 View (if Patient Has Fever And/or Copd)  09/12/2013   CLINICAL DATA:  Shortness of breath and cough.  EXAM: CHEST  2 VIEW  COMPARISON:  NM PET IMAGE INITIAL (PI) SKULL BASE TO THIGH dated 06/04/2013; CT CHEST W/O CM dated 05/22/2013; DG CHEST 2 VIEW dated 05/03/2013  FINDINGS: Mediastinum and hilar structures are normal. Severe interstitial prominence and bullous change noted consistent pulmonary interstitial fibrosis and COPD. Previously identified left mid lung zone nodule is not well identified on today's examination, reference made to a prior reports. No pneumothorax. No acute bony abnormality .  IMPRESSION: 1. 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 2. Previously identified nodule in the left mid lung field is not well identified on today's examination. This  may be secondary to projection. Reference is made to prior reports. If need be for further evaluation repeat CT or PET-CT can be obtained.   Electronically Signed   By: Marcello Moores  Register   On: 09/12/2013 10:03   Ct Angio Chest Pe W/cm &/or Wo Cm  09/12/2013   CLINICAL DATA:  Shortness of breath, question pulmonary embolism, history emphysema, asthma, COPD, hypertension, former smoker  EXAM: CT ANGIOGRAPHY CHEST WITH CONTRAST  TECHNIQUE: Multidetector CT imaging of the chest was performed using the standard protocol during bolus administration of intravenous contrast. Multiplanar CT image reconstructions and MIPs were obtained to evaluate the vascular anatomy.  CONTRAST:  184mL OMNIPAQUE IOHEXOL 350 MG/ML SOLN  COMPARISON:  05/22/2013  FINDINGS: Atherosclerotic calcifications aorta without aneurysm or dissection.  Pulmonary arteries well opacified with scattered respiratory motion artifacts at the lower lobes.  No definite evidence of pulmonary embolism.  Visualized upper abdomen unremarkable.  No thoracic adenopathy.  Severe emphysematous changes with minimal atelectasis in the right middle and right lower lobes.  Minimal interstitial changes at the right base.  Small focus of atelectasis or scarring in left lower lobe image 46.  4 mm diameter lung nodules left apex image 12 and lingula image 35 unchanged.  5 mm lingular nodule image 44 stable.  No acute infiltrate, pleural effusion or pneumothorax.  Bones diffusely demineralized.  Review of the MIP images confirms the above findings.  IMPRESSION: Severe COPD changes with scattered scarring and stable left lung nodules.  No evidence of pulmonary embolism or definite acute intra thoracic process.   Electronically Signed   By: Lavonia Dana M.D.   On: 09/12/2013 13:55    Medications:  Prior to Admission:  Prescriptions prior to admission  Medication Sig Dispense Refill  . albuterol (PROVENTIL HFA;VENTOLIN HFA) 108 (90 BASE) MCG/ACT inhaler Inhale 2 puffs into  the lungs every 6 (six) hours as needed for wheezing or shortness of breath.      Marland Kitchen albuterol (PROVENTIL) (2.5 MG/3ML) 0.083% nebulizer solution Take 2.5 mg by nebulization every 6 (six) hours as needed for wheezing.      . Fluticasone-Salmeterol (ADVAIR) 250-50 MCG/DOSE AEPB Inhale 1 puff into the lungs every 12 (twelve) hours.      Marland Kitchen losartan (COZAAR) 50 MG tablet Take 50 mg by mouth daily.      . ondansetron (ZOFRAN) 8 MG tablet Take 1 tablet (8 mg total) by mouth every 8 (eight) hours as needed for nausea.  12 tablet  0  .  oxyCODONE-acetaminophen (PERCOCET/ROXICET) 5-325 MG per tablet Take 0.5-1 tablets by mouth every 6 (six) hours as needed for pain.        Scheduled: . enoxaparin (LOVENOX) injection  40 mg Subcutaneous Q24H  . ipratropium-albuterol  3 mL Nebulization Q6H WA  . levofloxacin (LEVAQUIN) IV  750 mg Intravenous Q24H  . losartan  50 mg Oral Daily  . methylPREDNISolone (SOLU-MEDROL) injection  40 mg Intravenous Q6H  . mometasone-formoterol  2 puff Inhalation BID  . pantoprazole  40 mg Oral Daily  . sodium chloride  3 mL Intravenous Q12H   Continuous:  [id number] chloride, acetaminophen, acetaminophen, alum & mag hydroxide-simeth, LORazepam, morphine injection, ondansetron (ZOFRAN) IV, ondansetron, oxyCODONE-acetaminophen, sodium chloride  Assessment/Plan: Respiratory distress in a patient with known severe COPD. Currently he appears to be at his baseline, it appears some anxiety also contributed to his sensation of dyspnea. CT of the chest is negative for PE, his vitals are stable his back to his home dose of oxygen . We will continue a steroid taper, continue home oxygen, continue when necessary anxiolytic, hopefully patient will be stable for discharge to home today  LOS: 1 day   Mairlyn Tegtmeyer D 09/13/2013, 10:03 AM

## 2013-09-26 ENCOUNTER — Ambulatory Visit (INDEPENDENT_AMBULATORY_CARE_PROVIDER_SITE_OTHER): Payer: Medicare HMO | Admitting: Cardiothoracic Surgery

## 2013-09-26 ENCOUNTER — Other Ambulatory Visit: Payer: Medicare Other

## 2013-09-26 ENCOUNTER — Encounter: Payer: Self-pay | Admitting: Cardiothoracic Surgery

## 2013-09-26 VITALS — BP 140/50 | HR 79 | Resp 20 | Ht 71.0 in | Wt 178.0 lb

## 2013-09-26 DIAGNOSIS — D381 Neoplasm of uncertain behavior of trachea, bronchus and lung: Secondary | ICD-10-CM

## 2013-09-26 NOTE — Progress Notes (Signed)
[place] Record [id number] Date of Birth: 06/12/1934  Referring: Kandice Hams, MD Primary Care: Kandice Hams, MD  Chief Complaint:   lrft lung nodule  History of Present Illness:    [name] 80 y.o. male is seen in the office  today for severe COPD oxygen dependent an incidental finding of a less than 1 cm left lung nodule possibly malignant. The patient's had at least 10 year history of severe COPD oxygen dependent, very limited  Functionally due to shortness of breath. He fell in October 2014 and chest x-ray in the emergency room suggested a left lung lesion, CT scan and PET scan have been performed and the patient was referred to the [facility] clinic for further evaluation.    A repeat CT scan of the chest was scheduled after his initial evaluation for today. 2 weeks ago he was admitted to [facility] long hospital for short period with COPD exacerbation at that time a followup CT scan was performed so we canceled the one originally scheduled for today.    Current Activity/ Functional Status:  Patient is independent with mobility/ambulation, transfers, ADL's, IADL's.  Zubrod Score: At the time of surgery this patient's most appropriate activity status/level should be described as: []  Normal activity, no symptoms []  Symptoms, fully ambulatory [x]  Symptoms, in bed less than or equal to 50% of the time []  Symptoms, in bed greater than 50% of the time but less than 100% []  Bedridden []  Moribund   Past Medical History  Diagnosis Date  . Asthma   . COPD (chronic obstructive pulmonary disease)   . Emphysema of lung   . Hypertension     Past Surgical History  Procedure Laterality Date  . Lump removed from neck  2004  . Hernia repair  2008    No family history on file.  History   Social History  . Marital Status: Married    Spouse Name: N/A    Number of Children: N/A  . Years of Education: N/A    Occupational History  . Not on file.   Social History Main Topics  . Smoking status: Former Smoker    Quit date: 09/06/2002  . Smokeless tobacco: Never Used  . Alcohol Use: No  . Drug Use: No  . Sexual Activity: Not on file   Other Topics Concern  . Not on file   Social History Narrative  . No narrative on file    History  Smoking status  . Former Smoker  . Quit date: 09/06/2002  Smokeless tobacco  . Never Used    History  Alcohol Use No     No Known Allergies  Current Outpatient Prescriptions  Medication Sig Dispense Refill  . albuterol (PROVENTIL HFA;VENTOLIN HFA) 108 (90 BASE) MCG/ACT inhaler Inhale 2 puffs into the lungs every 6 (six) hours as needed for wheezing or shortness of breath.      Marland Kitchen albuterol (PROVENTIL) (2.5 MG/3ML) 0.083% nebulizer solution Take 2.5 mg by nebulization every 6 (six) hours as needed for wheezing.      Marland Kitchen ALPRAZolam (XANAX) 0.5 MG tablet Take 1 tablet (0.5 mg total) by mouth at bedtime as needed for anxiety.  30 tablet  0  . Fluticasone-Salmeterol (ADVAIR) 250-50 MCG/DOSE AEPB Inhale 1 puff into the lungs every 12 (twelve) hours.      Marland Kitchen losartan (  COZAAR) 50 MG tablet Take 50 mg by mouth daily.      . ondansetron (ZOFRAN) 8 MG tablet Take 1 tablet (8 mg total) by mouth every 8 (eight) hours as needed for nausea.  12 tablet  0  . oxyCODONE-acetaminophen (PERCOCET/ROXICET) 5-325 MG per tablet Take 0.5-1 tablets by mouth every 6 (six) hours as needed for pain.       . predniSONE (DELTASONE) 10 MG tablet 4 bid x 2 days then 4 qd x 2 days then 3 qd x 2days then 2 qd x 2 days then 1 qd x 2 days then stop  36 tablet  0   No current facility-administered medications for this visit.       Review of Systems:     Cardiac Review of Systems: Y or N  Chest Pain [  n  ]  Resting SOB Blue.Reese   ] Exertional SOB  [ y ]  Vertell Limber Florencio.Farrier  ]   Pedal Edema [  n ]    Palpitations [ n ] Syncope  [ n ]   Presyncope [ n  ]  General Review of Systems: [Y] = yes  [  ]=no Constitional: recent weight change [  ];  Wt loss over the last 3 months [n   ] anorexia [  ]; fatigue [  ]; nausea [  ]; night sweats [  ]; fever [ n]; or chills [n  ];          Dental: poor dentition[  ]; Last Dentist visit:   Eye : blurred vision [  ]; diplopia [   ]; vision changes [  ];  Amaurosis fugax[  ]; Resp: cough Blue.Reese  ];  wheezing[y  ];  hemoptysis[n  ]; shortness of breath[ y ]; paroxysmal nocturnal dyspnea[ y ]; dyspnea on exertion[y  ]; or orthopnea[  ];  GI:  Gallstones[known on ct  ], vomiting[  ];  dysphagia[  ]; melena[  ];  hematochezia [  ]; heartburn[  ];   Hx of  Colonoscopy[  ]; GU: kidney stones [  ]; hematuria[  ];   dysuria [  ];  nocturia[  ];  history of     obstruction [  ]; urinary frequency [  ]             Skin: rash, swelling[  ];, hair loss[  ];  peripheral edema[  ];  or itching[  ]; Musculosketetal: myalgias[  ];  joint swelling[  ];  joint erythema[  ];  joint pain[  ];  back pain[  ];  Heme/Lymph: bruising[  ];  bleeding[  ];  anemia[  ];  Neuro: TIA[  ];  headaches[  ];  stroke[  ];  vertigo[  ];  seizures[  ];   paresthesias[  ];  difficulty walking[ sob ];  Psych:depression[  ]; anxiety[  ];  Endocrine: diabetes[  ];  thyroid dysfunction[  ];  Immunizations: Flu up to date Blue.Reese  ]; Pneumococcal up to date [ y ];  Other:  Physical Exam: BP 140/50  Pulse 79  Resp 20  Ht 5\' 11"  (1.803 m)  Wt 178 lb (80.74 kg)  BMI 24.84 kg/m2  SpO2 93%  Patient remains on oxygen 3 L when at rest and turned to 5 with exertion  General appearance: alert, cooperative, appears older than stated age, fatigued and mild distress Neurologic: intact Heart: regular rate and rhythm, S1, S2 normal, no murmur, click, rub or gallop  Lungs: diminished breath sounds bilaterally Abdomen: soft, non-tender; bowel sounds normal; no masses,  no organomegaly Extremities: extremities normal, atraumatic, no cyanosis or edema and Homans sign is negative, no sign of DVT Patient has  no carotid bruits Has no cervical or supraclavicular adenopathy   Diagnostic Studies & Laboratory data:     Recent Radiology Findings:  Dg Chest 2 View (if Patient Has Fever And/or Copd)  09/12/2013   CLINICAL DATA:  Shortness of breath and cough.  EXAM: CHEST  2 VIEW  COMPARISON:  NM PET IMAGE INITIAL (PI) SKULL BASE TO THIGH dated 06/04/2013; CT CHEST W/O CM dated 05/22/2013; DG CHEST 2 VIEW dated 05/03/2013  FINDINGS: Mediastinum and hilar structures are normal. Severe interstitial prominence and bullous change noted consistent pulmonary interstitial fibrosis and COPD. Previously identified left mid lung zone nodule is not well identified on today's examination, reference made to a prior reports. No pneumothorax. No acute bony abnormality .  IMPRESSION: 1. 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 2. Previously identified nodule in the left mid lung field is not well identified on today's examination. This may be secondary to projection. Reference is made to prior reports. If need be for further evaluation repeat CT or PET-CT can be obtained.   Electronically Signed   By: Marcello Moores  Register   On: 09/12/2013 10:03   Ct Angio Chest Pe W/cm &/or Wo Cm  09/12/2013   CLINICAL DATA:  Shortness of breath, question pulmonary embolism, history emphysema, asthma, COPD, hypertension, former smoker  EXAM: CT ANGIOGRAPHY CHEST WITH CONTRAST  TECHNIQUE: Multidetector CT imaging of the chest was performed using the standard protocol during bolus administration of intravenous contrast. Multiplanar CT image reconstructions and MIPs were obtained to evaluate the vascular anatomy.  CONTRAST:  145mL OMNIPAQUE IOHEXOL 350 MG/ML SOLN  COMPARISON:  05/22/2013  FINDINGS: Atherosclerotic calcifications aorta without aneurysm or dissection.  Pulmonary arteries well opacified with scattered respiratory motion artifacts at the lower lobes.  No definite evidence of pulmonary embolism.  Visualized upper abdomen  unremarkable.  No thoracic adenopathy.  Severe emphysematous changes with minimal atelectasis in the right middle and right lower lobes.  Minimal interstitial changes at the right base.  Small focus of atelectasis or scarring in left lower lobe image 46.  4 mm diameter lung nodules left apex image 12 and lingula image 35 unchanged.  5 mm lingular nodule image 44 stable.  No acute infiltrate, pleural effusion or pneumothorax.  Bones diffusely demineralized.  Review of the MIP images confirms the above findings.  IMPRESSION: Severe COPD changes with scattered scarring and stable left lung nodules.  No evidence of pulmonary embolism or definite acute intra thoracic process.   Electronically Signed   By: Lavonia Dana M.D.   On: 09/12/2013 13:55    Ct Chest Wo Contrast  05/22/2013   CLINICAL DATA:  New pulmonary nodule seen on chest x-ray of 05/03/2013. Shortness of breath. COPD.  EXAM: CT CHEST WITHOUT CONTRAST  TECHNIQUE: Multidetector CT imaging of the chest was performed following the standard protocol without IV contrast.  COMPARISON:  Chest x-ray dated 05/03/2013 and chest CT dated 04/18/2010  FINDINGS: There is a new spiculated lobulated 16 x 13 x 9 mm nodule in the region of the lingula of the left upper lobe. The finding is consistent with a carcinoma of the lung.  The finding is superimposed on severe emphysema. There are several areas of new oblong nodularity in the left upper lobe which I suspect represent intrapulmonary lymph  nodes in areas of new parenchymal scarring.  There is no hilar or mediastinal adenopathy. Heart size is normal. No acute osseous abnormality. The thoracic kyphosis is markedly accentuated.  Scans of the upper abdomen demonstrate multiple gallstones as well as a 11 mm stone in the left renal pelvis. These were present on the prior CT scan abdomen dated 08/28/2012. Minimal dilatation of the left renal collecting system.  IMPRESSION: New spiculated 16 mm mass in the left upper lobe  consistent with a carcinoma of the lung.   Electronically Signed   By: Rozetta Nunnery M.D.   On: 05/22/2013 16:32   Nm Pet Image Initial (pi) Skull Base To Thigh  06/04/2013   CLINICAL DATA:  Initial Treatment strategy for left upper lobe lung lesion. Marland Kitchen  EXAM: NUCLEAR MEDICINE PET SKULL BASE TO THIGH  FASTING BLOOD GLUCOSE:  Value: 92mg /dl  TECHNIQUE: 17.2 mCi F-18 FDG was injected intravenously. CT data was obtained and used for attenuation correction and anatomic localization only. (This was not acquired as a diagnostic CT examination.) Additional exam technical data entered on technologist worksheet.  COMPARISON:  Chest CT 05/22/2013  FINDINGS: NECK  No hypermetabolic lymph nodes in the neck.  CHEST  The lingular spiculated lesion demonstrates FDG uptake with SUV max of 3.6. This is consistent with a primary lung neoplasm. There are several other pulmonary nodules but no definite abnormal FDG uptake. No mediastinal or hilar lymphadenopathy.  ABDOMEN/PELVIS  No abnormal hypermetabolic activity within the liver, pancreas, adrenal glands, or spleen. No hypermetabolic lymph nodes in the abdomen or pelvis.  SKELETON  No findings to suggest osseous metastatic disease. There is slight increased uptake in a right lower anterior with but this has the appearance of a fracture.  IMPRESSION: Lingular nodule demonstrates neoplastic range FDG uptake with SUV max of 3.6.  No mediastinal or hilar lymphadenopathy and no findings for metastatic disease.  Severe emphysematous changes with pulmonary scarring. No other definite FDG positive pulmonary nodules.   Electronically Signed   By: Kalman Jewels M.D.   On: 06/04/2013 13:12      Recent Lab Findings: Lab Results  Component Value Date   WBC 7.9 09/13/2013   HGB 13.8 09/13/2013   HCT 42.8 09/13/2013   PLT 122* 09/13/2013   GLUCOSE 134* 09/13/2013   ALT 16 04/18/2010   AST 19 04/18/2010   NA 140 09/13/2013   K 4.2 09/13/2013   CL 105 09/13/2013   CREATININE 1.16  09/13/2013   BUN 27* 09/13/2013   CO2 24 09/13/2013   TSH 0.889 04/17/2010      Assessment / Plan:   Patient has severely limiting COPD oxygen dependence and would be a very poor operative candidate.    Scan today shows stable pulmonary nodules without any interval growth Plan see the patient back in 8 months with a followup CT scan of the chest  I spent 55 minutes counseling the patient face to face. The total time spent in the appointment was 80 minutes.  Grace Isaac MD      [address] [place] [address] Office [phone]   Beeper [phone]  09/26/2013 2:22 PM

## 2014-02-03 ENCOUNTER — Other Ambulatory Visit: Payer: Self-pay | Admitting: *Deleted

## 2014-02-03 DIAGNOSIS — D381 Neoplasm of uncertain behavior of trachea, bronchus and lung: Secondary | ICD-10-CM

## 2014-03-11 ENCOUNTER — Other Ambulatory Visit (HOSPITAL_COMMUNITY): Payer: Self-pay | Admitting: Respiratory Therapy

## 2014-03-11 DIAGNOSIS — J441 Chronic obstructive pulmonary disease with (acute) exacerbation: Secondary | ICD-10-CM

## 2014-03-13 ENCOUNTER — Other Ambulatory Visit: Payer: Medicare HMO

## 2014-03-13 ENCOUNTER — Ambulatory Visit: Payer: Medicare HMO | Admitting: Cardiothoracic Surgery

## 2014-03-18 ENCOUNTER — Encounter (INDEPENDENT_AMBULATORY_CARE_PROVIDER_SITE_OTHER): Payer: Self-pay

## 2014-03-18 ENCOUNTER — Ambulatory Visit (HOSPITAL_COMMUNITY)
Admission: RE | Admit: 2014-03-18 | Discharge: 2014-03-18 | Disposition: A | Discharge status: Home / Self Care | Payer: Medicare HMO | Source: Ambulatory Visit | Attending: Internal Medicine | Admitting: Internal Medicine

## 2014-03-18 DIAGNOSIS — J441 Chronic obstructive pulmonary disease with (acute) exacerbation: Secondary | ICD-10-CM | POA: Insufficient documentation

## 2014-03-18 MED ORDER — ALBUTEROL SULFATE (2.5 MG/3ML) 0.083% IN NEBU
2.5000 mg | INHALATION_SOLUTION | Freq: Once | RESPIRATORY_TRACT | Status: AC
  Administered 2014-03-18: 2.5 mg via RESPIRATORY_TRACT

## 2014-03-21 LAB — PULMONARY FUNCTION TEST
DL/VA % pred: 31 %
DL/VA: 1.5 ml/min/mmHg/L
DLCO unc % pred: 16 %
DLCO unc: 5.94 ml/min/mmHg
FEF 25-75 POST: 0.33 L/s
FEF 25-75 Pre: 0.32 L/sec
FEF2575-%Change-Post: 4 %
FEF2575-%PRED-POST: 13 %
FEF2575-%Pred-Pre: 13 %
FEV1-%Change-Post: 4 %
FEV1-%PRED-POST: 24 %
FEV1-%PRED-PRE: 23 %
FEV1-PRE: 0.77 L
FEV1-Post: 0.8 L
FEV1FVC-%Change-Post: 1 %
FEV1FVC-%PRED-PRE: 53 %
FEV6-%Change-Post: 1 %
FEV6-%Pred-Post: 44 %
FEV6-%Pred-Pre: 43 %
FEV6-POST: 1.92 L
FEV6-Pre: 1.9 L
FEV6FVC-%Change-Post: -1 %
FEV6FVC-%Pred-Post: 99 %
FEV6FVC-%Pred-Pre: 100 %
FVC-%Change-Post: 2 %
FVC-%PRED-PRE: 43 %
FVC-%Pred-Post: 44 %
FVC-Post: 2.06 L
FVC-Pre: 2.01 L
POST FEV1/FVC RATIO: 39 %
PRE FEV1/FVC RATIO: 38 %
PRE FEV6/FVC RATIO: 94 %
Post FEV6/FVC ratio: 93 %
RV % PRED: 215 %
RV: 5.99 L
TLC % pred: 110 %
TLC: 8.46 L

## 2014-04-16 NOTE — Progress Notes (Signed)
[place] Record #[id number] Date of Birth: Sep 15, 1933  Referring: Kandice Hams, MD Primary Care: Kandice Hams, MD  Chief Complaint:   lrft lung nodule  History of Present Illness:    [name] 80 y.o. male is seen in the office  today for severe COPD oxygen dependent an incidental finding of a less than 1 cm left lung nodule  . The patient was originally seen approximately one year ago .The patient's had at least 10 year history of severe COPD oxygen dependent, very limited  Functionally due to shortness of breath. He fell in October 2014 and chest x-ray in the emergency room suggested a left lung lesion, CT scan and PET scan were performed and the patient was referred to the [facility] clinic for further evaluation. Followup CT scans were done in the spring of 2015 and her patient returns today with a followup CT scan.   He continues to be very limited due to his pulmonary reserve, on home oxygen.  Current Activity/ Functional Status:  Patient is independent with mobility/ambulation, transfers, ADL's, IADL's.  Zubrod Score: At the time of surgery this patient's most appropriate activity status/level should be described as: []  Normal activity, no symptoms []  Symptoms, fully ambulatory [x]  Symptoms, in bed less than or equal to 50% of the time []  Symptoms, in bed greater than 50% of the time but less than 100% []  Bedridden []  Moribund   Past Medical History  Diagnosis Date  . Asthma   . COPD (chronic obstructive pulmonary disease)   . Emphysema of lung   . Hypertension     Past Surgical History  Procedure Laterality Date  . Lump removed from neck  2004  . Hernia repair  2008    No family history on file.  History   Social History  . Marital Status: Married    Spouse Name: N/A    Number of Children: N/A  . Years of Education: N/A   Occupational History  . Not on file.   Social History Main Topics  .  Smoking status: Former Smoker    Quit date: 09/06/2002  . Smokeless tobacco: Never Used  . Alcohol Use: No  . Drug Use: No  . Sexual Activity: Not on file   Other Topics Concern  . Not on file   Social History Narrative  . No narrative on file    History  Smoking status  . Former Smoker  . Quit date: 09/06/2002  Smokeless tobacco  . Never Used    History  Alcohol Use No     No Known Allergies  Current Outpatient Prescriptions  Medication Sig Dispense Refill  . albuterol (PROVENTIL HFA;VENTOLIN HFA) 108 (90 BASE) MCG/ACT inhaler Inhale 2 puffs into the lungs every 6 (six) hours as needed for wheezing or shortness of breath.      . ALPRAZolam (XANAX) 0.5 MG tablet Take 1 tablet (0.5 mg total) by mouth at bedtime as needed for anxiety.  30 tablet  0  . Fluticasone-Salmeterol (ADVAIR) 250-50 MCG/DOSE AEPB Inhale 1 puff into the lungs every 12 (twelve) hours.      Marland Kitchen losartan (COZAAR) 50 MG tablet Take 50 mg by mouth daily.      . predniSONE (DELTASONE) 10 MG tablet 4 bid x 2 days then 4 qd x 2 days then 3 qd x  2days then 2 qd x 2 days then 1 qd x 2 days then stop  36 tablet  0  . albuterol (PROVENTIL) (2.5 MG/3ML) 0.083% nebulizer solution Take 2.5 mg by nebulization every 6 (six) hours as needed for wheezing.      . ondansetron (ZOFRAN) 8 MG tablet Take 1 tablet (8 mg total) by mouth every 8 (eight) hours as needed for nausea.  12 tablet  0  . oxyCODONE-acetaminophen (PERCOCET/ROXICET) 5-325 MG per tablet Take 0.5-1 tablets by mouth every 6 (six) hours as needed for pain.        No current facility-administered medications for this visit.       Review of Systems:     Cardiac Review of Systems: Y or N  Chest Pain [  n  ]  Resting SOB Blue.Reese   ] Exertional SOB  [ y ]  Vertell Limber Florencio.Farrier  ]   Pedal Edema [  n ]    Palpitations [ n ] Syncope  [ n ]   Presyncope [ n  ]  General Review of Systems: [Y] = yes [  ]=no Constitional: recent weight change [  ];  Wt loss over the last 3  months [n   ] anorexia [  ]; fatigue [  ]; nausea [  ]; night sweats [  ]; fever [ n]; or chills [n  ];          Dental: poor dentition[  ]; Last Dentist visit:   Eye : blurred vision [  ]; diplopia [   ]; vision changes [  ];  Amaurosis fugax[  ]; Resp: cough Blue.Reese  ];  wheezing[y  ];  hemoptysis[n  ]; shortness of breath[ y ]; paroxysmal nocturnal dyspnea[ y ]; dyspnea on exertion[y  ]; or orthopnea[  ];  GI:  Gallstones[known on ct  ], vomiting[  ];  dysphagia[  ]; melena[  ];  hematochezia [  ]; heartburn[  ];   Hx of  Colonoscopy[  ]; GU: kidney stones [  ]; hematuria[  ];   dysuria [  ];  nocturia[  ];  history of     obstruction [  ]; urinary frequency [  ]             Skin: rash, swelling[  ];, hair loss[  ];  peripheral edema[  ];  or itching[  ]; Musculosketetal: myalgias[  ];  joint swelling[  ];  joint erythema[  ];  joint pain[  ];  back pain[  ];  Heme/Lymph: bruising[  ];  bleeding[  ];  anemia[  ];  Neuro: TIA[  ];  headaches[  ];  stroke[  ];  vertigo[  ];  seizures[  ];   paresthesias[  ];  difficulty walking[ sob ];  Psych:depression[  ]; anxiety[  ];  Endocrine: diabetes[  ];  thyroid dysfunction[  ];  Immunizations: Flu up to date Blue.Reese  ]; Pneumococcal up to date [ y ];  Other:  Physical Exam: BP 122/72  Pulse 81  Ht 5\' 11"  (1.803 m)  Wt 178 lb (80.74 kg)  BMI 24.84 kg/m2  SpO2 90%  Patient remains on oxygen 3 L when at rest and turned to 5 with exertion  General appearance: alert, cooperative, appears older than stated age, fatigued and mild distress Neurologic: intact Heart: regular rate and rhythm, S1, S2 normal, no murmur, click, rub or gallop Lungs: diminished breath sounds bilaterally Abdomen: soft, non-tender; bowel sounds normal; no masses,  no organomegaly Extremities: extremities normal, atraumatic, no cyanosis or edema and Homans sign is negative, no sign of DVT Patient has no carotid bruits Has no cervical or supraclavicular adenopathy Patient has easy  bruisability of his arms  Diagnostic Studies & Laboratory data:     Recent Radiology Findings:  Ct Chest Wo Contrast  04/17/2014   CLINICAL DATA:  Followup indeterminate pulmonary nodules. COPD. Shortness of breath. Former smoker.  EXAM: CT CHEST WITHOUT CONTRAST  TECHNIQUE: Multidetector CT imaging of the chest was performed following the standard protocol without IV contrast.  COMPARISON:  09/12/2013 and 05/22/2013  FINDINGS: Mediastinum/Hilar Regions: No masses or pathologically enlarged lymph nodes identified.  Other Thoracic Lymphadenopathy:  None.  Lungs: Severe emphysema again noted. Multiple sub-cm left lung nodules remains stable, largest within the anterior left upper lobe measuring 7 mm on image 28. No new or enlarging pulmonary nodules or masses are identified.  Pleura:  No evidence of effusion or mass.  Vascular/Cardiac: No thoracic aortic aneurysm or other significant abnormality identified.  Musculoskeletal:  No suspicious bone lesions identified.  Other:  Tiny nonobstructive right renal calculus incidentally noted.  IMPRESSION: Stable sub-cm left lung nodules.  Stable severe COPD.  No acute findings.   Electronically Signed   By: Earle Gell M.D.   On: 04/17/2014 11:27   Dg Chest 2 View (if Patient Has Fever And/or Copd)  09/12/2013   CLINICAL DATA:  Shortness of breath and cough.  EXAM: CHEST  2 VIEW  COMPARISON:  NM PET IMAGE INITIAL (PI) SKULL BASE TO THIGH dated 06/04/2013; CT CHEST W/O CM dated 05/22/2013; DG CHEST 2 VIEW dated 05/03/2013  FINDINGS: Mediastinum and hilar structures are normal. Severe interstitial prominence and bullous change noted consistent pulmonary interstitial fibrosis and COPD. Previously identified left mid lung zone nodule is not well identified on today's examination, reference made to a prior reports. No pneumothorax. No acute bony abnormality .  IMPRESSION: 1. 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 2. Previously identified nodule in the left  mid lung field is not well identified on today's examination. This may be secondary to projection. Reference is made to prior reports. If need be for further evaluation repeat CT or PET-CT can be obtained.   Electronically Signed   By: Marcello Moores  Register   On: 09/12/2013 10:03   Ct Angio Chest Pe W/cm &/or Wo Cm  09/12/2013   CLINICAL DATA:  Shortness of breath, question pulmonary embolism, history emphysema, asthma, COPD, hypertension, former smoker  EXAM: CT ANGIOGRAPHY CHEST WITH CONTRAST  TECHNIQUE: Multidetector CT imaging of the chest was performed using the standard protocol during bolus administration of intravenous contrast. Multiplanar CT image reconstructions and MIPs were obtained to evaluate the vascular anatomy.  CONTRAST:  188mL OMNIPAQUE IOHEXOL 350 MG/ML SOLN  COMPARISON:  05/22/2013  FINDINGS: Atherosclerotic calcifications aorta without aneurysm or dissection.  Pulmonary arteries well opacified with scattered respiratory motion artifacts at the lower lobes.  No definite evidence of pulmonary embolism.  Visualized upper abdomen unremarkable.  No thoracic adenopathy.  Severe emphysematous changes with minimal atelectasis in the right middle and right lower lobes.  Minimal interstitial changes at the right base.  Small focus of atelectasis or scarring in left lower lobe image 46.  4 mm diameter lung nodules left apex image 12 and lingula image 35 unchanged.  5 mm lingular nodule image 44 stable.  No acute infiltrate, pleural effusion or pneumothorax.  Bones diffusely demineralized.  Review of the MIP images confirms the above  findings.  IMPRESSION: Severe COPD changes with scattered scarring and stable left lung nodules.  No evidence of pulmonary embolism or definite acute intra thoracic process.   Electronically Signed   By: Lavonia Dana M.D.   On: 09/12/2013 13:55    Ct Chest Wo Contrast  05/22/2013   CLINICAL DATA:  New pulmonary nodule seen on chest x-ray of 05/03/2013. Shortness of breath.  COPD.  EXAM: CT CHEST WITHOUT CONTRAST  TECHNIQUE: Multidetector CT imaging of the chest was performed following the standard protocol without IV contrast.  COMPARISON:  Chest x-ray dated 05/03/2013 and chest CT dated 04/18/2010  FINDINGS: There is a new spiculated lobulated 16 x 13 x 9 mm nodule in the region of the lingula of the left upper lobe. The finding is consistent with a carcinoma of the lung.  The finding is superimposed on severe emphysema. There are several areas of new oblong nodularity in the left upper lobe which I suspect represent intrapulmonary lymph nodes in areas of new parenchymal scarring.  There is no hilar or mediastinal adenopathy. Heart size is normal. No acute osseous abnormality. The thoracic kyphosis is markedly accentuated.  Scans of the upper abdomen demonstrate multiple gallstones as well as a 11 mm stone in the left renal pelvis. These were present on the prior CT scan abdomen dated 08/28/2012. Minimal dilatation of the left renal collecting system.  IMPRESSION: New spiculated 16 mm mass in the left upper lobe consistent with a carcinoma of the lung.   Electronically Signed   By: Rozetta Nunnery M.D.   On: 05/22/2013 16:32   Nm Pet Image Initial (pi) Skull Base To Thigh  06/04/2013   CLINICAL DATA:  Initial Treatment strategy for left upper lobe lung lesion. Marland Kitchen  EXAM: NUCLEAR MEDICINE PET SKULL BASE TO THIGH  FASTING BLOOD GLUCOSE:  Value: 92mg /dl  TECHNIQUE: 17.2 mCi F-18 FDG was injected intravenously. CT data was obtained and used for attenuation correction and anatomic localization only. (This was not acquired as a diagnostic CT examination.) Additional exam technical data entered on technologist worksheet.  COMPARISON:  Chest CT 05/22/2013  FINDINGS: NECK  No hypermetabolic lymph nodes in the neck.  CHEST  The lingular spiculated lesion demonstrates FDG uptake with SUV max of 3.6. This is consistent with a primary lung neoplasm. There are several other pulmonary nodules but no  definite abnormal FDG uptake. No mediastinal or hilar lymphadenopathy.  ABDOMEN/PELVIS  No abnormal hypermetabolic activity within the liver, pancreas, adrenal glands, or spleen. No hypermetabolic lymph nodes in the abdomen or pelvis.  SKELETON  No findings to suggest osseous metastatic disease. There is slight increased uptake in a right lower anterior with but this has the appearance of a fracture.  IMPRESSION: Lingular nodule demonstrates neoplastic range FDG uptake with SUV max of 3.6.  No mediastinal or hilar lymphadenopathy and no findings for metastatic disease.  Severe emphysematous changes with pulmonary scarring. No other definite FDG positive pulmonary nodules.   Electronically Signed   By: Kalman Jewels M.D.   On: 06/04/2013 13:12      Recent Lab Findings: Lab Results  Component Value Date   WBC 7.9 09/13/2013   HGB 13.8 09/13/2013   HCT 42.8 09/13/2013   PLT 122* 09/13/2013   GLUCOSE 134* 09/13/2013   ALT 16 04/18/2010   AST 19 04/18/2010   NA 140 09/13/2013   K 4.2 09/13/2013   CL 105 09/13/2013   CREATININE 1.16 09/13/2013   BUN 27* 09/13/2013   CO2 24  09/13/2013   TSH 0.889 04/17/2010   PFT's 03/2014   FEV1   0 .77 23% DLCO 5.91 16%   Assessment / Plan:  Stable sub-cm left lung nodules. - By CT scan Patient has severely limiting COPD oxygen dependence and would be a very poor operative candidate.    Severe Obstructive Airways Disease Severe Diffusion Defect  I have reviewed with the patient the findings of his CT scan of the chest done today. With the small size and stability of the nodules in his severe underlying pulmonary disease I would not recommend anything more than followup CT. Will plan followup CT of the chest in one year.    Grace Isaac MD   [address] [place] [address] Office [phone]   Beeper [phone]  04/17/2014 11:36 AM

## 2014-04-17 ENCOUNTER — Ambulatory Visit
Admission: RE | Admit: 2014-04-17 | Discharge: 2014-04-17 | Disposition: A | Discharge status: Home / Self Care | Payer: Commercial Managed Care - HMO | Source: Ambulatory Visit | Attending: Cardiothoracic Surgery | Admitting: Cardiothoracic Surgery

## 2014-04-17 ENCOUNTER — Ambulatory Visit (INDEPENDENT_AMBULATORY_CARE_PROVIDER_SITE_OTHER): Payer: Commercial Managed Care - HMO | Admitting: Cardiothoracic Surgery

## 2014-04-17 ENCOUNTER — Encounter: Payer: Self-pay | Admitting: Cardiothoracic Surgery

## 2014-04-17 VITALS — BP 122/72 | HR 81 | Ht 71.0 in | Wt 178.0 lb

## 2014-04-17 DIAGNOSIS — D381 Neoplasm of uncertain behavior of trachea, bronchus and lung: Secondary | ICD-10-CM

## 2014-04-17 DIAGNOSIS — R918 Other nonspecific abnormal finding of lung field: Secondary | ICD-10-CM

## 2014-05-12 ENCOUNTER — Emergency Department (HOSPITAL_COMMUNITY): Payer: Medicare HMO

## 2014-05-12 ENCOUNTER — Encounter (HOSPITAL_COMMUNITY): Payer: Self-pay | Admitting: Emergency Medicine

## 2014-05-12 ENCOUNTER — Emergency Department (HOSPITAL_COMMUNITY)
Admission: EM | Admit: 2014-05-12 | Discharge: 2014-05-12 | Disposition: A | Discharge status: Home / Self Care | Payer: Medicare HMO | Attending: Emergency Medicine | Admitting: Emergency Medicine

## 2014-05-12 DIAGNOSIS — I10 Essential (primary) hypertension: Secondary | ICD-10-CM | POA: Diagnosis not present

## 2014-05-12 DIAGNOSIS — Z87891 Personal history of nicotine dependence: Secondary | ICD-10-CM | POA: Diagnosis not present

## 2014-05-12 DIAGNOSIS — R5383 Other fatigue: Secondary | ICD-10-CM | POA: Diagnosis not present

## 2014-05-12 DIAGNOSIS — Z792 Long term (current) use of antibiotics: Secondary | ICD-10-CM | POA: Insufficient documentation

## 2014-05-12 DIAGNOSIS — J441 Chronic obstructive pulmonary disease with (acute) exacerbation: Secondary | ICD-10-CM | POA: Diagnosis not present

## 2014-05-12 DIAGNOSIS — Z7952 Long term (current) use of systemic steroids: Secondary | ICD-10-CM | POA: Diagnosis not present

## 2014-05-12 DIAGNOSIS — R0602 Shortness of breath: Secondary | ICD-10-CM

## 2014-05-12 DIAGNOSIS — Z79899 Other long term (current) drug therapy: Secondary | ICD-10-CM | POA: Diagnosis not present

## 2014-05-12 LAB — CBC WITH DIFFERENTIAL/PLATELET
Basophils Absolute: 0 10*3/uL (ref 0.0–0.1)
Basophils Relative: 0 % (ref 0–1)
Eosinophils Absolute: 0.3 10*3/uL (ref 0.0–0.7)
Eosinophils Relative: 3 % (ref 0–5)
HCT: 49.7 % (ref 39.0–52.0)
Hemoglobin: 16.2 g/dL (ref 13.0–17.0)
LYMPHS ABS: 1.4 10*3/uL (ref 0.7–4.0)
LYMPHS PCT: 14 % (ref 12–46)
MCH: 30.9 pg (ref 26.0–34.0)
MCHC: 32.6 g/dL (ref 30.0–36.0)
MCV: 94.8 fL (ref 78.0–100.0)
Monocytes Absolute: 1.4 10*3/uL — ABNORMAL HIGH (ref 0.1–1.0)
Monocytes Relative: 14 % — ABNORMAL HIGH (ref 3–12)
NEUTROS PCT: 69 % (ref 43–77)
Neutro Abs: 7.3 10*3/uL (ref 1.7–7.7)
PLATELETS: 141 10*3/uL — AB (ref 150–400)
RBC: 5.24 MIL/uL (ref 4.22–5.81)
RDW: 13.5 % (ref 11.5–15.5)
WBC: 10.5 10*3/uL (ref 4.0–10.5)

## 2014-05-12 LAB — COMPREHENSIVE METABOLIC PANEL
ALT: 26 U/L (ref 0–53)
AST: 20 U/L (ref 0–37)
Albumin: 3.5 g/dL (ref 3.5–5.2)
Alkaline Phosphatase: 79 U/L (ref 39–117)
Anion gap: 10 (ref 5–15)
BUN: 32 mg/dL — ABNORMAL HIGH (ref 6–23)
CO2: 29 meq/L (ref 19–32)
Calcium: 9.1 mg/dL (ref 8.4–10.5)
Chloride: 100 mEq/L (ref 96–112)
Creatinine, Ser: 1.36 mg/dL — ABNORMAL HIGH (ref 0.50–1.35)
GFR, EST AFRICAN AMERICAN: 56 mL/min — AB (ref >90)
GFR, EST NON AFRICAN AMERICAN: 48 mL/min — AB (ref >90)
GLUCOSE: 93 mg/dL (ref 70–99)
POTASSIUM: 5.1 meq/L (ref 3.7–5.3)
SODIUM: 139 meq/L (ref 137–147)
Total Bilirubin: 0.9 mg/dL (ref 0.3–1.2)
Total Protein: 6.4 g/dL (ref 6.0–8.3)

## 2014-05-12 LAB — PRO B NATRIURETIC PEPTIDE: Pro B Natriuretic peptide (BNP): 310.2 pg/mL (ref 0–450)

## 2014-05-12 LAB — TROPONIN I: Troponin I: <0.3 ng/mL (ref <0.30)

## 2014-05-12 MED ORDER — HYDROCOD POLST-CHLORPHEN POLST 10-8 MG/5ML PO LQCR: 5.0000 mL | Freq: Two times a day (BID) | ORAL | Status: DC | PRN

## 2014-05-12 MED ORDER — PREDNISONE 20 MG PO TABS: ORAL_TABLET | ORAL | Status: DC

## 2014-05-12 MED ORDER — IPRATROPIUM-ALBUTEROL 0.5-2.5 (3) MG/3ML IN SOLN
3.0000 mL | Freq: Once | RESPIRATORY_TRACT | Status: AC
  Administered 2014-05-12: 3 mL via RESPIRATORY_TRACT
  Filled 2014-05-12: qty 3

## 2014-05-12 MED ORDER — ALBUTEROL SULFATE (2.5 MG/3ML) 0.083% IN NEBU
2.5000 mg | INHALATION_SOLUTION | Freq: Once | RESPIRATORY_TRACT | Status: AC
  Administered 2014-05-12: 2.5 mg via RESPIRATORY_TRACT
  Filled 2014-05-12: qty 3

## 2014-05-12 MED ORDER — DOXYCYCLINE HYCLATE 100 MG PO CAPS: 100.0000 mg | ORAL_CAPSULE | Freq: Two times a day (BID) | ORAL | Status: DC

## 2014-05-12 NOTE — ED Notes (Signed)
MD at bedside. 

## 2014-05-12 NOTE — ED Notes (Signed)
Patient asking for Tussionex Will make EDP aware

## 2014-05-12 NOTE — ED Notes (Signed)
Pt ambulatory to restroom with minimal assistance. Steady gait

## 2014-05-12 NOTE — ED Notes (Signed)
Bed: RESA Expected date:  Expected time:  Means of arrival:  Comments: EMS- SOB, Hx of Lung CA

## 2014-05-12 NOTE — Discharge Instructions (Signed)

## 2014-05-12 NOTE — ED Provider Notes (Signed)
CSN: [id number]     Arrival date & time 05/12/14  1419 History   First MD Initiated Contact with Patient 05/12/14 1503     Chief Complaint  Patient presents with  . Shortness of Breath     (Consider location/radiation/quality/duration/timing/severity/associated sxs/prior Treatment) HPI Comments: Patient presents to the ER for evaluation of shortness of breath and cough. Patient reports that symptoms have been present for 8 days. When his cough started 8 days ago, he was seen by his primary doctor and was given a 5 day course of antibiotics and prednisone. Patient reports that he is still having significant amount of cough, productive of thick sputum. He feels short of breath and weak.  Patient is a 80 y.o. male presenting with shortness of breath.  Shortness of Breath Associated symptoms: cough     Past Medical History  Diagnosis Date  . Asthma   . COPD (chronic obstructive pulmonary disease)   . Emphysema of lung   . Hypertension    Past Surgical History  Procedure Laterality Date  . Lump removed from neck  2004  . Hernia repair  2008   History reviewed. No pertinent family history. History  Substance Use Topics  . Smoking status: Former Smoker    Quit date: 09/06/2002  . Smokeless tobacco: Never Used  . Alcohol Use: No    Review of Systems  Constitutional: Positive for fatigue.  Respiratory: Positive for cough and shortness of breath.   All other systems reviewed and are negative.     Allergies  Review of patient's allergies indicates no known allergies.  Home Medications   Prior to Admission medications   Medication Sig Start Date End Date Taking? Authorizing Provider  albuterol (PROVENTIL HFA;VENTOLIN HFA) 108 (90 BASE) MCG/ACT inhaler Inhale 2 puffs into the lungs every 6 (six) hours as needed for wheezing or shortness of breath.    Historical Provider, MD  albuterol (PROVENTIL) (2.5 MG/3ML) 0.083% nebulizer solution Take 2.5 mg by nebulization every 6  (six) hours as needed for wheezing.    Historical Provider, MD  ALPRAZolam Duanne Moron) 0.5 MG tablet Take 1 tablet (0.5 mg total) by mouth at bedtime as needed for anxiety. 09/13/13   Kandice Hams, MD  chlorpheniramine-HYDROcodone [facility] PENNKINETIC ER) 10-8 MG/5ML LQCR Take 5 mLs by mouth every 12 (twelve) hours as needed for cough. 05/12/14   Orpah Greek, MD  doxycycline (VIBRAMYCIN) 100 MG capsule Take 1 capsule (100 mg total) by mouth 2 (two) times daily. 05/12/14   Orpah Greek, MD  Fluticasone-Salmeterol (ADVAIR) 250-50 MCG/DOSE AEPB Inhale 1 puff into the lungs every 12 (twelve) hours.    Historical Provider, MD  losartan (COZAAR) 50 MG tablet Take 50 mg by mouth daily.    Historical Provider, MD  ondansetron (ZOFRAN) 8 MG tablet Take 1 tablet (8 mg total) by mouth every 8 (eight) hours as needed for nausea. 05/03/13   Orlie Dakin, MD  oxyCODONE-acetaminophen (PERCOCET/ROXICET) 5-325 MG per tablet Take 0.5-1 tablets by mouth every 6 (six) hours as needed for pain.     Historical Provider, MD  predniSONE (DELTASONE) 10 MG tablet 4 bid x 2 days then 4 qd x 2 days then 3 qd x 2days then 2 qd x 2 days then 1 qd x 2 days then stop 09/13/13   Kandice Hams, MD  predniSONE (DELTASONE) 20 MG tablet 3 tabs po daily x 3 days, then 2 tabs x 3 days, then 1.5 tabs x 3 days, then 1 tab  x 3 days, then 0.5 tabs x 3 days 05/12/14   Orpah Greek, MD   BP 134/82  Pulse 117  Temp(Src) 98.3 F (36.8 C) (Oral)  Resp 14  SpO2 94% Physical Exam  Constitutional: He is oriented to person, place, and time. He appears well-developed and well-nourished. No distress.  HENT:  Head: Normocephalic and atraumatic.  Right Ear: Hearing normal.  Left Ear: Hearing normal.  Nose: Nose normal.  Mouth/Throat: Oropharynx is clear and moist and mucous membranes are normal.  Eyes: Conjunctivae and EOM are normal. Pupils are equal, round, and reactive to light.  Neck: Normal range of  motion. Neck supple.  Cardiovascular: Regular rhythm, S1 normal and S2 normal.  Exam reveals no gallop and no friction rub.   No murmur heard. Pulmonary/Chest: Effort normal. No respiratory distress. He has decreased breath sounds. He has wheezes. He exhibits no tenderness.  Abdominal: Soft. Normal appearance and bowel sounds are normal. There is no hepatosplenomegaly. There is no tenderness. There is no rebound, no guarding, no tenderness at McBurney's point and negative Murphy's sign. No hernia.  Musculoskeletal: Normal range of motion.  Neurological: He is alert and oriented to person, place, and time. He has normal strength. No cranial nerve deficit or sensory deficit. Coordination normal. GCS eye subscore is 4. GCS verbal subscore is 5. GCS motor subscore is 6.  Skin: Skin is warm, dry and intact. No rash noted. No cyanosis.  Psychiatric: He has a normal mood and affect. His speech is normal and behavior is normal. Thought content normal.    ED Course  Procedures (including critical care time) Labs Review Labs Reviewed  CBC WITH DIFFERENTIAL - Abnormal; Notable for the following:    Platelets 141 (*)    Monocytes Relative 14 (*)    Monocytes Absolute 1.4 (*)    All other components within normal limits  COMPREHENSIVE METABOLIC PANEL - Abnormal; Notable for the following:    BUN 32 (*)    Creatinine, Ser 1.36 (*)    GFR calc non Af Amer 48 (*)    GFR calc Af Amer 56 (*)    All other components within normal limits  TROPONIN I  PRO B NATRIURETIC PEPTIDE    Imaging Review Dg Chest 2 View  05/12/2014   CLINICAL DATA:  Cough. Short of breath increasing over the last several days. Long time smoker. Hypertension, emphysema.  EXAM: CHEST  2 VIEW  COMPARISON:  Chest CT, 04/17/2014.  Chest radiograph, 09/12/2013.  FINDINGS: Cardiac silhouette is normal in size and configuration. No mediastinal or hilar masses or evidence of adenopathy.  Lungs are hyperexpanded. There are stable areas of  lung scarring. Deformity of the right fifth rib is stable. No pleural effusion or pneumothorax. Bony thorax is demineralized.  IMPRESSION: 1. Advanced COPD. 2. No acute cardiopulmonary disease.   Electronically Signed   By: Lajean Manes M.D.   On: 05/12/2014 16:36     EKG Interpretation   Date/Time:  Monday May 12 2014 15:21:58 EDT Ventricular Rate:  84 PR Interval:  178 QRS Duration: 106 QT Interval:  411 QTC Calculation: 486 R Axis:   -30 Text Interpretation:  Sinus rhythm Left axis deviation Borderline  prolonged QT interval No significant change since last tracing Confirmed  by Dandrae Kustra  MD, [place] [phone]) on 05/12/2014 4:23:46 PM      MDM   Final diagnoses:  Shortness of breath  Chronic obstructive pulmonary disease with acute exacerbation    Presents to the ER  for evaluation of shortness of breath and cough. Symptoms have been present for 8 days. Patient reports taking a 5 day course of antibiotics, likely Zithromax. He was on 5 days of prednisone as well. Symptoms have not improved. Patient does have a history of COPD. Oxygen saturation is 97% on his normal 3 L which he is on continuously. Patient's chest x-ray does not show any acute findings. No evidence of pneumonia or congestive heart failure. Patient will be treated with a tapering dose of prednisone, doxycycline, Tussionex for cough. Follow-up with primary doctor.    Orpah Greek, MD 05/12/14 [phone]

## 2014-05-12 NOTE — ED Notes (Addendum)
Patient initially upset r/t wait time Then patient upset because EDP would not give Tussionex--patient was given DC Rx of Tussionex Patient then upset because a portable O2 tank was not immediately available for his use to ambulate to the bathroom Both this nurse, ED Charge nurse and ED tech informed patient that we would look for portable tank Patient brought in by EMS When this nurse asked patient how he was getting home, patient refused to answer and stated "Well we will just see won't we?" Patient repeatedly said this statement to this nurse Patient then asking for the last names of several nursing staff members Patient informed that he would NOT be given ANY staff members last name [facility] made aware of patient complaints

## 2014-05-12 NOTE — ED Notes (Signed)
Patient arrives via [facility] due to c/o SOB x 8 days Patient took antibiotics without relief and was only able to get half of the Rx filled due to financial reasons Patient with hx of COPD, asthma, emphysema and lung cancer (bilateral) Patient denies CP, N/V Patient does endorse loss of appetite Patient arrives alert and oriented x 4 Patient able to speak in full, complete sentences without difficulty--handles secretions

## 2014-06-09 ENCOUNTER — Encounter (HOSPITAL_COMMUNITY): Payer: Self-pay | Admitting: Emergency Medicine

## 2014-06-09 ENCOUNTER — Emergency Department (HOSPITAL_COMMUNITY): Payer: Medicare HMO

## 2014-06-09 ENCOUNTER — Inpatient Hospital Stay (HOSPITAL_COMMUNITY)
Admission: EM | Admit: 2014-06-09 | Discharge: 2014-06-13 | DRG: 190 | Disposition: A | Discharge status: Home / Self Care | Payer: Medicare HMO | Attending: Internal Medicine | Admitting: Internal Medicine

## 2014-06-09 DIAGNOSIS — I5040 Unspecified combined systolic (congestive) and diastolic (congestive) heart failure: Secondary | ICD-10-CM | POA: Diagnosis present

## 2014-06-09 DIAGNOSIS — I10 Essential (primary) hypertension: Secondary | ICD-10-CM | POA: Diagnosis present

## 2014-06-09 DIAGNOSIS — I5020 Unspecified systolic (congestive) heart failure: Secondary | ICD-10-CM | POA: Diagnosis present

## 2014-06-09 DIAGNOSIS — I471 Supraventricular tachycardia: Secondary | ICD-10-CM | POA: Diagnosis present

## 2014-06-09 DIAGNOSIS — J9621 Acute and chronic respiratory failure with hypoxia: Secondary | ICD-10-CM | POA: Diagnosis present

## 2014-06-09 DIAGNOSIS — Z9981 Dependence on supplemental oxygen: Secondary | ICD-10-CM | POA: Diagnosis not present

## 2014-06-09 DIAGNOSIS — R001 Bradycardia, unspecified: Secondary | ICD-10-CM | POA: Diagnosis not present

## 2014-06-09 DIAGNOSIS — R0602 Shortness of breath: Secondary | ICD-10-CM

## 2014-06-09 DIAGNOSIS — R918 Other nonspecific abnormal finding of lung field: Secondary | ICD-10-CM | POA: Diagnosis present

## 2014-06-09 DIAGNOSIS — J441 Chronic obstructive pulmonary disease with (acute) exacerbation: Principal | ICD-10-CM | POA: Diagnosis present

## 2014-06-09 DIAGNOSIS — R0600 Dyspnea, unspecified: Secondary | ICD-10-CM | POA: Diagnosis not present

## 2014-06-09 DIAGNOSIS — Z87891 Personal history of nicotine dependence: Secondary | ICD-10-CM

## 2014-06-09 DIAGNOSIS — E46 Unspecified protein-calorie malnutrition: Secondary | ICD-10-CM | POA: Diagnosis present

## 2014-06-09 DIAGNOSIS — J45909 Unspecified asthma, uncomplicated: Secondary | ICD-10-CM | POA: Diagnosis present

## 2014-06-09 DIAGNOSIS — Z66 Do not resuscitate: Secondary | ICD-10-CM | POA: Diagnosis present

## 2014-06-09 DIAGNOSIS — J9600 Acute respiratory failure, unspecified whether with hypoxia or hypercapnia: Secondary | ICD-10-CM | POA: Diagnosis present

## 2014-06-09 DIAGNOSIS — J9611 Chronic respiratory failure with hypoxia: Secondary | ICD-10-CM | POA: Diagnosis present

## 2014-06-09 DIAGNOSIS — R609 Edema, unspecified: Secondary | ICD-10-CM

## 2014-06-09 HISTORY — DX: Depression, unspecified: F32.A

## 2014-06-09 HISTORY — DX: Other nonspecific abnormal finding of lung field: R91.8

## 2014-06-09 HISTORY — DX: Anxiety disorder, unspecified: F41.9

## 2014-06-09 HISTORY — DX: Reserved for inherently not codable concepts without codable children: IMO0001

## 2014-06-09 HISTORY — DX: Major depressive disorder, single episode, unspecified: F32.9

## 2014-06-09 HISTORY — DX: Dependence on supplemental oxygen: Z99.81

## 2014-06-09 LAB — CBC WITH DIFFERENTIAL/PLATELET
BASOS ABS: 0 10*3/uL (ref 0.0–0.1)
Basophils Relative: 0 % (ref 0–1)
EOS PCT: 0 % (ref 0–5)
Eosinophils Absolute: 0 10*3/uL (ref 0.0–0.7)
HEMATOCRIT: 44.9 % (ref 39.0–52.0)
Hemoglobin: 14.4 g/dL (ref 13.0–17.0)
Lymphocytes Relative: 12 % (ref 12–46)
Lymphs Abs: 1.2 10*3/uL (ref 0.7–4.0)
MCH: 30.2 pg (ref 26.0–34.0)
MCHC: 32.1 g/dL (ref 30.0–36.0)
MCV: 94.1 fL (ref 78.0–100.0)
MONOS PCT: 9 % (ref 3–12)
Monocytes Absolute: 0.9 10*3/uL (ref 0.1–1.0)
Neutro Abs: 8 10*3/uL — ABNORMAL HIGH (ref 1.7–7.7)
Neutrophils Relative %: 79 % — ABNORMAL HIGH (ref 43–77)
Platelets: 124 10*3/uL — ABNORMAL LOW (ref 150–400)
RBC: 4.77 MIL/uL (ref 4.22–5.81)
RDW: 14.7 % (ref 11.5–15.5)
WBC: 10.1 10*3/uL (ref 4.0–10.5)

## 2014-06-09 LAB — CBC
HCT: 47.5 % (ref 39.0–52.0)
Hemoglobin: 15.5 g/dL (ref 13.0–17.0)
MCH: 31.2 pg (ref 26.0–34.0)
MCHC: 32.6 g/dL (ref 30.0–36.0)
MCV: 95.6 fL (ref 78.0–100.0)
PLATELETS: 125 10*3/uL — AB (ref 150–400)
RBC: 4.97 MIL/uL (ref 4.22–5.81)
RDW: 14.6 % (ref 11.5–15.5)
WBC: 11.5 10*3/uL — AB (ref 4.0–10.5)

## 2014-06-09 LAB — BASIC METABOLIC PANEL
ANION GAP: 12 (ref 5–15)
BUN: 29 mg/dL — AB (ref 6–23)
CALCIUM: 8.9 mg/dL (ref 8.4–10.5)
CO2: 27 meq/L (ref 19–32)
CREATININE: 1.04 mg/dL (ref 0.50–1.35)
Chloride: 105 mEq/L (ref 96–112)
GFR calc Af Amer: 77 mL/min — ABNORMAL LOW (ref >90)
GFR calc non Af Amer: 67 mL/min — ABNORMAL LOW (ref >90)
Glucose, Bld: 89 mg/dL (ref 70–99)
Potassium: 4.3 mEq/L (ref 3.7–5.3)
Sodium: 144 mEq/L (ref 137–147)

## 2014-06-09 LAB — CREATININE, SERUM
CREATININE: 1.07 mg/dL (ref 0.50–1.35)
GFR calc Af Amer: 75 mL/min — ABNORMAL LOW (ref >90)
GFR, EST NON AFRICAN AMERICAN: 64 mL/min — AB (ref >90)

## 2014-06-09 MED ORDER — ACETAMINOPHEN 650 MG RE SUPP: 650.0000 mg | Freq: Four times a day (QID) | RECTAL | Status: DC | PRN

## 2014-06-09 MED ORDER — MORPHINE SULFATE 2 MG/ML IJ SOLN: 1.0000 mg | INTRAMUSCULAR | Status: DC | PRN

## 2014-06-09 MED ORDER — SODIUM CHLORIDE 0.9 % IJ SOLN
3.0000 mL | Freq: Two times a day (BID) | INTRAMUSCULAR | Status: DC
  Administered 2014-06-09 – 2014-06-12 (×5): 3 mL via INTRAVENOUS

## 2014-06-09 MED ORDER — ACETAMINOPHEN 325 MG PO TABS: 650.0000 mg | ORAL_TABLET | Freq: Four times a day (QID) | ORAL | Status: DC | PRN

## 2014-06-09 MED ORDER — METHYLPREDNISOLONE SODIUM SUCC 125 MG IJ SOLR
60.0000 mg | Freq: Three times a day (TID) | INTRAMUSCULAR | Status: DC
  Administered 2014-06-09 – 2014-06-11 (×6): 60 mg via INTRAVENOUS
  Filled 2014-06-09 (×6): qty 2

## 2014-06-09 MED ORDER — ALUM & MAG HYDROXIDE-SIMETH 200-200-20 MG/5ML PO SUSP: 30.0000 mL | Freq: Four times a day (QID) | ORAL | Status: DC | PRN

## 2014-06-09 MED ORDER — DILTIAZEM HCL 25 MG/5ML IV SOLN: 20.0000 mg | Freq: Once | INTRAVENOUS | Status: DC

## 2014-06-09 MED ORDER — CITALOPRAM HYDROBROMIDE 20 MG PO TABS
20.0000 mg | ORAL_TABLET | Freq: Every day | ORAL | Status: DC
  Administered 2014-06-09 – 2014-06-13 (×5): 20 mg via ORAL
  Filled 2014-06-09 (×5): qty 1

## 2014-06-09 MED ORDER — ALBUTEROL SULFATE (2.5 MG/3ML) 0.083% IN NEBU: 2.5000 mg | INHALATION_SOLUTION | RESPIRATORY_TRACT | Status: DC | PRN

## 2014-06-09 MED ORDER — HYDROCOD POLST-CHLORPHEN POLST 10-8 MG/5ML PO LQCR
5.0000 mL | Freq: Two times a day (BID) | ORAL | Status: DC | PRN
  Administered 2014-06-09 – 2014-06-13 (×8): 5 mL via ORAL
  Filled 2014-06-09 (×8): qty 5

## 2014-06-09 MED ORDER — BUDESONIDE 0.25 MG/2ML IN SUSP
0.2500 mg | Freq: Two times a day (BID) | RESPIRATORY_TRACT | Status: DC
  Administered 2014-06-09 – 2014-06-13 (×8): 0.25 mg via RESPIRATORY_TRACT
  Filled 2014-06-09 (×8): qty 2

## 2014-06-09 MED ORDER — SODIUM CHLORIDE 0.9 % IV SOLN
INTRAVENOUS | Status: AC
  Administered 2014-06-09: 1000 mL via INTRAVENOUS

## 2014-06-09 MED ORDER — ALPRAZOLAM 0.5 MG PO TABS
0.5000 mg | ORAL_TABLET | Freq: Every evening | ORAL | Status: DC | PRN
  Administered 2014-06-09 – 2014-06-12 (×4): 0.5 mg via ORAL
  Filled 2014-06-09 (×4): qty 1

## 2014-06-09 MED ORDER — IPRATROPIUM BROMIDE 0.02 % IN SOLN
1.0000 mg | Freq: Once | RESPIRATORY_TRACT | Status: AC
  Administered 2014-06-09: 1 mg via RESPIRATORY_TRACT
  Filled 2014-06-09 (×2): qty 5

## 2014-06-09 MED ORDER — GUAIFENESIN-DM 100-10 MG/5ML PO SYRP
5.0000 mL | ORAL_SOLUTION | ORAL | Status: DC | PRN
  Administered 2014-06-12: 5 mL via ORAL
  Filled 2014-06-09: qty 5

## 2014-06-09 MED ORDER — GUAIFENESIN ER 600 MG PO TB12
600.0000 mg | ORAL_TABLET | Freq: Two times a day (BID) | ORAL | Status: DC
  Administered 2014-06-09 – 2014-06-13 (×9): 600 mg via ORAL
  Filled 2014-06-09 (×9): qty 1

## 2014-06-09 MED ORDER — LEVALBUTEROL HCL 0.63 MG/3ML IN NEBU
0.6300 mg | INHALATION_SOLUTION | Freq: Four times a day (QID) | RESPIRATORY_TRACT | Status: DC
  Administered 2014-06-09 – 2014-06-10 (×3): 0.63 mg via RESPIRATORY_TRACT
  Filled 2014-06-09 (×4): qty 3

## 2014-06-09 MED ORDER — ONDANSETRON HCL 4 MG/2ML IJ SOLN: 4.0000 mg | Freq: Four times a day (QID) | INTRAMUSCULAR | Status: DC | PRN

## 2014-06-09 MED ORDER — OXYCODONE HCL 5 MG PO TABS
5.0000 mg | ORAL_TABLET | ORAL | Status: DC | PRN
  Administered 2014-06-09 – 2014-06-13 (×12): 5 mg via ORAL
  Filled 2014-06-09 (×12): qty 1

## 2014-06-09 MED ORDER — METHYLPREDNISOLONE SODIUM SUCC 125 MG IJ SOLR
125.0000 mg | Freq: Once | INTRAMUSCULAR | Status: AC
  Administered 2014-06-09: 125 mg via INTRAVENOUS
  Filled 2014-06-09: qty 2

## 2014-06-09 MED ORDER — ENOXAPARIN SODIUM 40 MG/0.4ML ~~LOC~~ SOLN
40.0000 mg | SUBCUTANEOUS | Status: DC
  Administered 2014-06-09 – 2014-06-12 (×4): 40 mg via SUBCUTANEOUS
  Filled 2014-06-09 (×4): qty 0.4

## 2014-06-09 MED ORDER — DILTIAZEM HCL 25 MG/5ML IV SOLN
10.0000 mg | INTRAVENOUS | Status: DC | PRN
  Administered 2014-06-09: 10 mg via INTRAVENOUS
  Filled 2014-06-09 (×3): qty 5

## 2014-06-09 MED ORDER — SENNA 8.6 MG PO TABS
1.0000 | ORAL_TABLET | Freq: Two times a day (BID) | ORAL | Status: DC
  Administered 2014-06-09 – 2014-06-13 (×9): 8.6 mg via ORAL
  Filled 2014-06-09 (×9): qty 1

## 2014-06-09 MED ORDER — ONDANSETRON HCL 4 MG PO TABS: 4.0000 mg | ORAL_TABLET | Freq: Four times a day (QID) | ORAL | Status: DC | PRN

## 2014-06-09 MED ORDER — ALBUTEROL SULFATE (2.5 MG/3ML) 0.083% IN NEBU: 2.5000 mg | INHALATION_SOLUTION | RESPIRATORY_TRACT | Status: DC

## 2014-06-09 MED ORDER — DILTIAZEM HCL 25 MG/5ML IV SOLN
10.0000 mg | Freq: Once | INTRAVENOUS | Status: AC
  Administered 2014-06-09: 10 mg via INTRAVENOUS
  Filled 2014-06-09: qty 5

## 2014-06-09 MED ORDER — LEVOFLOXACIN IN D5W 500 MG/100ML IV SOLN
500.0000 mg | INTRAVENOUS | Status: DC
  Administered 2014-06-09 – 2014-06-11 (×3): 500 mg via INTRAVENOUS
  Filled 2014-06-09 (×5): qty 100

## 2014-06-09 MED ORDER — ALBUTEROL (5 MG/ML) CONTINUOUS INHALATION SOLN
10.0000 mg/h | INHALATION_SOLUTION | Freq: Once | RESPIRATORY_TRACT | Status: AC
  Administered 2014-06-09: 10 mg/h via RESPIRATORY_TRACT
  Filled 2014-06-09: qty 40
  Filled 2014-06-09: qty 20

## 2014-06-09 MED ORDER — IPRATROPIUM BROMIDE 0.02 % IN SOLN
0.5000 mg | Freq: Four times a day (QID) | RESPIRATORY_TRACT | Status: DC
  Administered 2014-06-09 – 2014-06-10 (×3): 0.5 mg via RESPIRATORY_TRACT
  Filled 2014-06-09 (×4): qty 2.5

## 2014-06-09 MED ORDER — DILTIAZEM HCL 60 MG PO TABS
60.0000 mg | ORAL_TABLET | Freq: Three times a day (TID) | ORAL | Status: DC
  Administered 2014-06-09 – 2014-06-10 (×3): 60 mg via ORAL
  Filled 2014-06-09 (×3): qty 1

## 2014-06-09 NOTE — Progress Notes (Signed)
ANTIBIOTIC CONSULT NOTE - INITIAL  Pharmacy Consult for Levaquin Indication: COPD exacerbation   No Known Allergies  Patient Measurements: Height: 6\' 1"  (185.4 cm) Weight: 175 lb (79.379 kg) IBW/kg (Calculated) : 79.9 Adjusted Body Weight: n/a   Vital Signs: Temp: 98.4 F (36.9 C) (11/23 1254) Temp Source: Oral (11/23 1254) BP: 145/77 mmHg (11/23 1254) Pulse Rate: 118 (11/23 1224) Intake/Output from previous day:   Intake/Output from this shift: Total I/O In: -  Out: 250 [Urine:250]  Labs:  Recent Labs  06/09/14 0700  WBC 10.1  HGB 14.4  PLT 124*  CREATININE 1.04   Estimated Creatinine Clearance: 65.7 mL/min (by C-G formula based on Cr of 1.04). No results for input(s): VANCOTROUGH, VANCOPEAK, VANCORANDOM, GENTTROUGH, GENTPEAK, GENTRANDOM, TOBRATROUGH, TOBRAPEAK, TOBRARND, AMIKACINPEAK, AMIKACINTROU, AMIKACIN in the last 72 hours.   Microbiology: No results found for this or any previous visit (from the past 720 hour(s)).  Medical History: Past Medical History  Diagnosis Date  . Asthma   . COPD (chronic obstructive pulmonary disease)   . Emphysema of lung   . Hypertension   . On home O2     3L N/C continuous  . Multiple lung nodules     Medications:  Prescriptions prior to admission  Medication Sig Dispense Refill Last Dose  . ALPRAZolam (XANAX) 0.5 MG tablet Take 1 tablet (0.5 mg total) by mouth at bedtime as needed for anxiety. 30 tablet 0 unknown  . chlorpheniramine-HYDROcodone (TUSSIONEX PENNKINETIC ER) 10-8 MG/5ML LQCR Take 5 mLs by mouth every 12 (twelve) hours as needed for cough. 115 mL 0 unknown  . citalopram (CELEXA) 20 MG tablet Take 20 mg by mouth daily.   06/08/2014 at Unknown time  . doxycycline (VIBRAMYCIN) 100 MG capsule Take 1 capsule (100 mg total) by mouth 2 (two) times daily. 20 capsule 0   . predniSONE (DELTASONE) 20 MG tablet 3 tabs po daily x 3 days, then 2 tabs x 3 days, then 1.5 tabs x 3 days, then 1 tab x 3 days, then 0.5 tabs x 3  days 27 tablet 0    Assessment: 66 YOM with h/o COPD presented with SOB and persistent, productive cough. Pharmacy consulted to start Levaquin for COPD exacerbation. WBC is wnl. Pt is afebrile. CrCl ~ 65 mL/min   Goal of Therapy:  Resolution of infection   Plan:  -Start Levaquin 500 mg IV Q 24 hours -Monitor CBC, renal fx, and patient's clinical progress   Albertina Parr, PharmD.  Clinical Pharmacist Pager [phone]

## 2014-06-09 NOTE — ED Notes (Signed)
Pt arrived by EMS c/o SOB that started at 0530 this am after pt ambulated to restroom.

## 2014-06-09 NOTE — Progress Notes (Signed)
VASCULAR LAB PRELIMINARY  PRELIMINARY  PRELIMINARY  PRELIMINARY  Bilateral lower extremity venous Dopplers completed.    Preliminary report:  There is no DVT or SVT noted in the bilateral lower extremities.   Dakari Stabler, RVT 06/09/2014, 12:36 PM

## 2014-06-09 NOTE — ED Notes (Signed)
This RN and Erick Blinks at bedside with patient.  Patient heart rate at 155-169, appearing very anxious.  Patient maintaining O2 saturation of 92-95% on 3L nasal cannula.  Patient was attempting to use the urinal when he appeared to have a mild panic attack.  Patient encourage to get back in bed and not to sit on the side of the bed.  Patient compliant and encouraged to breath through his nostrils instead of his mouth.    Patient in the bed with a heart rate of 149 - 155.  Dr. Thurnell Garbe made aware.  Repeat EKG completed.

## 2014-06-09 NOTE — H&P (Signed)
[facility] History and Physical                                                                                    Patient Demographics  [name], is a 80 y.o. male  MRN: [id number]   DOB - 1934/03/17  Admit Date - 06/09/2014  Outpatient Primary MD for the patient is Kandice Hams, MD   With History of -  Past Medical History  Diagnosis Date  . Asthma   . COPD (chronic obstructive pulmonary disease)   . Emphysema of lung   . Hypertension   . On home O2     3L N/C continuous  . Multiple lung nodules       Past Surgical History  Procedure Laterality Date  . Lump removed from neck  2004  . Hernia repair  2008    in for   Chief Complaint  Patient presents with  . Shortness of Breath     HPI  [name]  is a 80 y.o. male, with PMH of asthma, COPD- emphysema on 3L O2 at home, hypertension, and pulmonary nodules presenting to the ED today with shortness of breath.  Pt reports onset of shortness of breath this morning after coughing.  Persistent productive cough over the past 52months which has remained the same.  He also notes feeling weak/fatigued over the last 3 days.  Denies any chest pain, fever, chills, sweats, nausea, vomiting, or diarrhea.  Uses Advair, Albuterol neb, Duoneb, and 3L O2 at home.  Recent ED visit on 10/26 for similar symptoms.  No admissions in the last 6 months.  Some improvement of SOB with hour long neb in the ED.  Asymptomatic MAT observed in ED.  Rate improved with administration of 10mg  IV injection Cardizem.    Review of Systems    In addition to the HPI above,  No Fever-chills, No Headache, No changes with Vision or hearing, No problems swallowing food or Liquids, No Chest pain,  +Cough and Shortness of breath No Abdominal pain, No Nausea or Vomiting, Bowel movements are regular, No new skin rashes or bruises,   A full 10 point Review of Systems was done, except as stated above, all other Review of Systems were  negative.   Social History History  Substance Use Topics  . Smoking status: Former Smoker    Quit date: 09/06/2002  . Smokeless tobacco: Never Used  . Alcohol Use: No     Family History History reviewed. No pertinent family history.   Prior to Admission medications   Medication Sig Start Date End Date Taking? Authorizing Provider  ALPRAZolam Duanne Moron) 0.5 MG tablet Take 1 tablet (0.5 mg total) by mouth at bedtime as needed for anxiety. 09/13/13  Yes Kandice Hams, MD  chlorpheniramine-HYDROcodone [facility] PENNKINETIC ER) 10-8 MG/5ML LQCR Take 5 mLs by mouth every 12 (twelve) hours as needed for cough. 05/12/14  Yes Orpah Greek, MD  citalopram (CELEXA) 20 MG tablet Take 20 mg by mouth daily.   Yes Historical Provider, MD  doxycycline (VIBRAMYCIN) 100 MG capsule Take 1 capsule (100 mg total) by mouth 2 (two) times daily. 05/12/14   Orpah Greek,  MD  predniSONE (DELTASONE) 20 MG tablet 3 tabs po daily x 3 days, then 2 tabs x 3 days, then 1.5 tabs x 3 days, then 1 tab x 3 days, then 0.5 tabs x 3 days 05/12/14   Orpah Greek, MD    No Known Allergies  Physical Exam  Vitals  Blood pressure 117/56, pulse 115, temperature 97.8 F (36.6 C), temperature source Oral, resp. rate 17, height 6\' 1"  (1.854 m), weight 79.379 kg (175 lb), SpO2 91 %.   General:  Thin appearing male lying in bed in mild distress, not using any accessory muscles, easily speaking in full sentences.  Psych:  Normal affect and insight, Awake Alert, Oriented X 3.  Neuro:   No F.N deficits, ALL C.Nerves Intact  ENT:  Moist Oral Mucosa.  Respiratory:  Decreased air entry bilaterally, air entry heard mostly in the upper lobes. No wheezing.  Cardiac:  Irregularly irregular tachycardic, No Gallops, Rubs or Murmurs, No Parasternal Heave.  Abdomen:  Positive Bowel Sounds, Abdomen Soft, Non tender, No organomegaly appriciated  Skin:  No Cyanosis, Normal Skin Turgor, No Skin Rash or  Bruise.  Extremities:  1+ pitting edema, Good muscle tone,  joints appear normal , no effusions, Normal ROM.   Data Review  CBC  Recent Labs Lab 06/09/14 0700  WBC 10.1  HGB 14.4  HCT 44.9  PLT 124*  MCV 94.1  MCH 30.2  MCHC 32.1  RDW 14.7  LYMPHSABS 1.2  MONOABS 0.9  EOSABS 0.0  BASOSABS 0.0   ------------------------------------------------------------------------------------------------------------------  Chemistries   Recent Labs Lab 06/09/14 0700  NA 144  K 4.3  CL 105  CO2 27  GLUCOSE 89  BUN 29*  CREATININE 1.04  CALCIUM 8.9   ------------------------------------------------------------------------------------------------------------------ estimated creatinine clearance is 65.7 mL/min (by C-G formula based on Cr of 1.04). ------------------------------------------------------------------------------------------------------------------  Imaging results:   Dg Chest 2 View  06/09/2014   CLINICAL DATA:  Weakness shortness of breath with history of oxygen dependent COPD  EXAM: CHEST  2 VIEW  COMPARISON:  PA and lateral chest dated May 12, 2014  FINDINGS: There is marked hyperinflation. The pulmonary interstitial markings are chronically increased but are slightly more conspicuous today. There is no alveolar infiltrate, pneumothorax, nor pleural effusion. The heart and pulmonary vascularity are within the limits of normal. There is chronic deformity of the right fifth rib.  IMPRESSION: Severe COPD. Slightly increased pulmonary interstitial markings may reflect subsegmental atelectasis or interstitial pneumonia.   Electronically Signed   By: David  Martinique   On: 06/09/2014 07:57   Dg Chest 2 View  05/12/2014   CLINICAL DATA:  Cough. Short of breath increasing over the last several days. Long time smoker. Hypertension, emphysema.  EXAM: CHEST  2 VIEW  COMPARISON:  Chest CT, 04/17/2014.  Chest radiograph, 09/12/2013.  FINDINGS: Cardiac silhouette is normal in  size and configuration. No mediastinal or hilar masses or evidence of adenopathy.  Lungs are hyperexpanded. There are stable areas of lung scarring. Deformity of the right fifth rib is stable. No pleural effusion or pneumothorax. Bony thorax is demineralized.  IMPRESSION: 1. Advanced COPD. 2. No acute cardiopulmonary disease.   Electronically Signed   By: Lajean Manes M.D.   On: 05/12/2014 16:36    My personal review of EKG: Rhythm MAT, Rate  144/min, QTc 484 , no Acute ST changes    Assessment & Plan Acute on chronic hypoxic respiratory failure: Likely due to acute exacerbation of COPD.Admit, tachycardic but given one hour long  neb, doubt PE, but will get a Doppler to make sure no DVT.Start steroids, nebs, other supportive care.            COPD with acute exacerbation: Some improvement since arrival in the ED with 1 hour neb.  CXR shows severe COPD.  Slightly increased pulmonary interstitial markings may reflect subsegmental atelectasis or interstitial pneumonia.  Will start Albuterol neb, Atrovent neb, IV Solu-medrol, and IV Levaquin.  Continue O2 and titrate to home regimen as tolerated.    Atrial tachycardia, multifocal: Likely due to COPD exacerbation.  Rate improved with IV injection 10 mg Cardizem.  Will continue Cardizem 60 mg PO q8h.Check Echo.  ESL:PNPY Losartan-will place on cardizem to control rate-should be for BP control as well  Abnormal chest x-ray with multiple lung nodules: Chronic problem.  Followed by [facility] as outpatient.    DVT Prophylaxis: Lovenox 40mg  daily  AM Labs Ordered, also please review Full Orders  Family Communication: Pt is awake and alert     Code Status: DNR-confirmed with patient at bedside  Likely DC to  home  Condition:  Guarded  Time spent in minutes : [place] PA-S on 06/09/2014 at 11:39 AM  Between 7am to 7pm   After 7pm go to [email or web address] - password [email or web address]  And look for the night coverage person covering me after  hours  [facility] Office  [phone]

## 2014-06-09 NOTE — ED Provider Notes (Signed)
CSN: [id number]     Arrival date & time 06/09/14  3976 History   First MD Initiated Contact with Patient 06/09/14 [phone]     Chief Complaint  Patient presents with  . Shortness of Breath      HPI Pt was seen at 0705.  Per pt, c/o gradual onset and worsening of persistent cough and SOB for the past 2 months, worse over the past few days.  Describes his symptoms as "it might be my COPD again."  Has been using home O2 N/C, MDI and nebs with transient relief.  Denies CP/palpitations, no back pain, no abd pain, no N/V/D, no fevers, no rash.     Past Medical History  Diagnosis Date  . Asthma   . COPD (chronic obstructive pulmonary disease)   . Emphysema of lung   . Hypertension   . On home O2     3L N/C continuous  . Multiple lung nodules    Past Surgical History  Procedure Laterality Date  . Lump removed from neck  2004  . Hernia repair  2008    History  Substance Use Topics  . Smoking status: Former Smoker    Quit date: 09/06/2002  . Smokeless tobacco: Never Used  . Alcohol Use: No    Review of Systems ROS: Statement: All systems negative except as marked or noted in the HPI; Constitutional: Negative for fever and chills. ; ; Eyes: Negative for eye pain, redness and discharge. ; ; ENMT: Negative for ear pain, hoarseness, nasal congestion, sinus pressure and sore throat. ; ; Cardiovascular: Negative for chest pain, palpitations, diaphoresis, and peripheral edema. ; ; Respiratory: +cough, SOB. Negative for wheezing and stridor. ; ; Gastrointestinal: Negative for nausea, vomiting, diarrhea, abdominal pain, blood in stool, hematemesis, jaundice and rectal bleeding. . ; ; Genitourinary: Negative for dysuria, flank pain and hematuria. ; ; Musculoskeletal: Negative for back pain and neck pain. Negative for swelling and trauma.; ; Skin: Negative for pruritus, rash, abrasions, blisters, bruising and skin lesion.; ; Neuro: Negative for headache, lightheadedness and neck stiffness. Negative  for weakness, altered level of consciousness , altered mental status, extremity weakness, paresthesias, involuntary movement, seizure and syncope.      Allergies  Review of patient's allergies indicates no known allergies.  Home Medications   Prior to Admission medications   Medication Sig Start Date End Date Taking? Authorizing Provider  albuterol (PROVENTIL HFA;VENTOLIN HFA) 108 (90 BASE) MCG/ACT inhaler Inhale 2 puffs into the lungs every 6 (six) hours as needed for wheezing or shortness of breath.   Yes Historical Provider, MD  albuterol (PROVENTIL) (2.5 MG/3ML) 0.083% nebulizer solution Take 2.5 mg by nebulization every 6 (six) hours as needed for wheezing.   Yes Historical Provider, MD  ALPRAZolam Duanne Moron) 0.5 MG tablet Take 1 tablet (0.5 mg total) by mouth at bedtime as needed for anxiety. 09/13/13  Yes Kandice Hams, MD  chlorpheniramine-HYDROcodone [facility] PENNKINETIC ER) 10-8 MG/5ML LQCR Take 5 mLs by mouth every 12 (twelve) hours as needed for cough. 05/12/14  Yes Orpah Greek, MD  citalopram (CELEXA) 20 MG tablet Take 20 mg by mouth daily.   Yes Historical Provider, MD  Fluticasone-Salmeterol (ADVAIR) 250-50 MCG/DOSE AEPB Inhale 1 puff into the lungs every 12 (twelve) hours.   Yes Historical Provider, MD  ipratropium-albuterol (DUONEB) 0.5-2.5 (3) MG/3ML SOLN Take 3 mLs by nebulization every 6 (six) hours as needed (for shortness of breath).   Yes Historical Provider, MD  losartan (COZAAR) 50 MG tablet Take  50 mg by mouth daily.   Yes Historical Provider, MD  ondansetron (ZOFRAN) 8 MG tablet Take 1 tablet (8 mg total) by mouth every 8 (eight) hours as needed for nausea. 05/03/13  Yes Orlie Dakin, MD  oxyCODONE-acetaminophen (PERCOCET/ROXICET) 5-325 MG per tablet Take 0.5-1 tablets by mouth every 6 (six) hours as needed for pain.    Yes Historical Provider, MD  predniSONE (DELTASONE) 10 MG tablet 4 bid x 2 days then 4 qd x 2 days then 3 qd x 2days then 2 qd x 2 days  then 1 qd x 2 days then stop 09/13/13  Yes Kandice Hams, MD  doxycycline (VIBRAMYCIN) 100 MG capsule Take 1 capsule (100 mg total) by mouth 2 (two) times daily. 05/12/14   Orpah Greek, MD  predniSONE (DELTASONE) 20 MG tablet 3 tabs po daily x 3 days, then 2 tabs x 3 days, then 1.5 tabs x 3 days, then 1 tab x 3 days, then 0.5 tabs x 3 days 05/12/14   Orpah Greek, MD   BP 125/70 mmHg  Pulse 102  Temp(Src) 97.8 F (36.6 C) (Oral)  Resp 20  Ht 6\' 1"  (1.854 m)  Wt 175 lb (79.379 kg)  BMI 23.09 kg/m2  SpO2 92% Physical Exam  0710: Physical examination:  Nursing notes reviewed; Vital signs and O2 SAT reviewed;  Constitutional: Well developed, Well nourished, Well hydrated, In no acute distress; Head:  Normocephalic, atraumatic; Eyes: EOMI, PERRL, No scleral icterus; ENMT: Mouth and pharynx normal, Mucous membranes moist; Neck: Supple, Full range of motion, No lymphadenopathy; Cardiovascular: Tachycardic rate and irregular rhythm, No gallop; Respiratory: Breath sounds diminished & equal bilaterally, faint scattered wheezes. No audible wheezing. +non-productive cough during exam. Tachypneic. Speaking full sentences, Normal respiratory effort/excursion; Chest: Nontender, Movement normal; Abdomen: Soft, Nontender, Nondistended, Normal bowel sounds; Genitourinary: No CVA tenderness; Extremities: Pulses normal, No tenderness, No edema, No calf edema or asymmetry.; Neuro: AA&Ox3, Major CN grossly intact.  Speech clear. No gross focal motor or sensory deficits in extremities.; Skin: Color normal, Warm, Dry.   ED Course  Procedures     EKG Interpretation   Date/Time:  Monday June 09 2014 06:46:27 EST Ventricular Rate:  122 PR Interval:  153 QRS Duration: 105 QT Interval:  344 QTC Calculation: 490 R Axis:   9 Text Interpretation:  Sinus tachycardia with irregular rate Nonspecific ST  and T wave abnormality Baseline wander When compared with ECG of  05/12/2014 Nonspecific  ST and T wave abnormality is now Present Confirmed  by [facility]  MD, Nunzio Cory [phone]) on 06/09/2014 9:34:47 AM      EKG Interpretation  Date/Time:  Monday June 09 2014 09:35:18 EST Ventricular Rate:  143 PR Interval:  128 QRS Duration: 100 QT Interval:  300 QTC Calculation: 463 R Axis:   4 Text Interpretation:  Poor data quality, interpretation may be adversely affected Sinus tachycardia with frequent Premature atrial complexes Artifact Baseline wander Since last tracing of earlier today No significant change was found Confirmed by [facility]  MD, Nunzio Cory [id number]) on 06/09/2014 9:51:43 AM         MDM  MDM Reviewed: previous chart, nursing note and vitals Reviewed previous: labs and ECG Interpretation: labs, ECG and x-ray Total time providing critical care: 30-74 minutes. This excludes time spent performing separately reportable procedures and services. Consults: admitting MD   CRITICAL CARE Performed by: Alfonzo Feller Total critical care time: 35 Critical care time was exclusive of separately billable procedures and treating other patients. Critical  care was necessary to treat or prevent imminent or life-threatening deterioration. Critical care was time spent personally by me on the following activities: development of treatment plan with patient and/or surrogate as well as nursing, discussions with consultants, evaluation of patient's response to treatment, examination of patient, obtaining history from patient or surrogate, ordering and performing treatments and interventions, ordering and review of laboratory studies, ordering and review of radiographic studies, pulse oximetry and re-evaluation of patient's condition.   Results for orders placed or performed during the hospital encounter of 06/09/14  CBC with Differential  Result Value Ref Range   WBC 10.1 4.0 - 10.5 K/uL   RBC 4.77 4.22 - 5.81 MIL/uL   Hemoglobin 14.4 13.0 - 17.0 g/dL   HCT 44.9 39.0 - 52.0 %    MCV 94.1 78.0 - 100.0 fL   MCH 30.2 26.0 - 34.0 pg   MCHC 32.1 30.0 - 36.0 g/dL   RDW 14.7 11.5 - 15.5 %   Platelets 124 (L) 150 - 400 K/uL   Neutrophils Relative % 79 (H) 43 - 77 %   Neutro Abs 8.0 (H) 1.7 - 7.7 K/uL   Lymphocytes Relative 12 12 - 46 %   Lymphs Abs 1.2 0.7 - 4.0 K/uL   Monocytes Relative 9 3 - 12 %   Monocytes Absolute 0.9 0.1 - 1.0 K/uL   Eosinophils Relative 0 0 - 5 %   Eosinophils Absolute 0.0 0.0 - 0.7 K/uL   Basophils Relative 0 0 - 1 %   Basophils Absolute 0.0 0.0 - 0.1 K/uL  Basic metabolic panel  Result Value Ref Range   Sodium 144 137 - 147 mEq/L   Potassium 4.3 3.7 - 5.3 mEq/L   Chloride 105 96 - 112 mEq/L   CO2 27 19 - 32 mEq/L   Glucose, Bld 89 70 - 99 mg/dL   BUN 29 (H) 6 - 23 mg/dL   Creatinine, Ser 1.04 0.50 - 1.35 mg/dL   Calcium 8.9 8.4 - 10.5 mg/dL   GFR calc non Af Amer 67 (L) >90 mL/min   GFR calc Af Amer 77 (L) >90 mL/min   Anion gap 12 5 - 15   Dg Chest 2 View 06/09/2014   CLINICAL DATA:  Weakness shortness of breath with history of oxygen dependent COPD  EXAM: CHEST  2 VIEW  COMPARISON:  PA and lateral chest dated May 12, 2014  FINDINGS: There is marked hyperinflation. The pulmonary interstitial markings are chronically increased but are slightly more conspicuous today. There is no alveolar infiltrate, pneumothorax, nor pleural effusion. The heart and pulmonary vascularity are within the limits of normal. There is chronic deformity of the right fifth rib.  IMPRESSION: Severe COPD. Slightly increased pulmonary interstitial markings may reflect subsegmental atelectasis or interstitial pneumonia.   Electronically Signed   By: David  Martinique   On: 06/09/2014 07:57    1005:  Pt wheezing and tachypneic: IV solumedrol and hour long neb started on pt arrival. After neb, monitor with sinus tach with frequent PAC's and artifact, possible MAT, no clear afib at this time. Pt sat up on edge of stretcher to get ready to walk and pt's O2 Sats dropped to  87% while on his usual O2 3L N/C with pt having increased RR and c/o increasing SOB. Pt placed back on stretcher by ED staff; will admit. T/C to [facility]. Sloan Leiter, case discussed, including:  HPI, pertinent PM/SHx, VS/PE, dx testing, ED course and treatment:  Agreeable to admit, requests to  consult Cards, write temporary orders, obtain tele bed to team [facility]. T/C to Cards, case discussed, including:  HPI, pertinent PM/SHx, VS/PE, dx testing, ED course and treatment:  Agreeable to consult.      Francine Graven, DO 06/12/14 1438

## 2014-06-09 NOTE — Care Management Note (Unsigned)
    Page 1 of 1   06/11/2014     1:09:58 PM CARE MANAGEMENT NOTE 06/11/2014  Patient:  [name], [name]   Account Number:  [id number]  Date Initiated:  06/09/2014  Documentation initiated by:  GRAVES-BIGELOW,Kathrynne Kulinski  Subjective/Objective Assessment:   Pt admitted for COPD exacerbation.  Pt is from home with wife and has DME 02 with [facility].     Action/Plan:   CM to monitor for disposition needs.   Anticipated DC Date:  06/11/2014   Anticipated DC Plan:  [place]  CM consult      Choice offered to / List presented to:             Status of service:  In process, will continue to follow Medicare Important Message given?  YES (If response is "NO", the following Medicare IM given date fields will be blank) Date Medicare IM given:  06/11/2014 Medicare IM given by:  GRAVES-BIGELOW,Sadi Arave Date Additional Medicare IM given:   Additional Medicare IM given by:    Discharge Disposition:    Per UR Regulation:  Reviewed for med. necessity/level of care/duration of stay  If discussed at [place] of Stay Meetings, dates discussed:    Comments:

## 2014-06-09 NOTE — Consult Note (Signed)
Reason for Consult: Multifocal atrial tachcardia  Requesting Physician: Ghimire  Cardiologist: None  HPI: This is a 80 y.o. male with a past medical history significant for severe oxygen dependent COPD and multiple lung nodules (possibly neoplastic) presents with acute exacerbation requiring high dose beta agonists and developed rapid irregular tachycardia. There are distinct P waves of varying morphology. As his breathing has improved throughout the day the tachycardia is much better. Still has frequent PACs, but no MAT. None of the tracings appear to be true atrial fibrillation. He is unaware of palpitations, but sometimes has "panic attacks" at home, that might represent bursts of arrhythmia.  PMHx:  Past Medical History  Diagnosis Date  . Asthma   . COPD (chronic obstructive pulmonary disease)   . Emphysema of lung   . Hypertension   . On home O2     3L N/C continuous  . Multiple lung nodules   . Shortness of breath dyspnea   . Anxiety   . Depression    Past Surgical History  Procedure Laterality Date  . Lump removed from neck  2004  . Hernia repair  2008    FAMHx: History reviewed. No pertinent family history.  SOCHx:  reports that he quit smoking about 11 years ago. He has never used smokeless tobacco. He reports that he does not drink alcohol or use illicit drugs.  ALLERGIES: No Known Allergies  ROS: Major complaints are severe cough and chronic dyspnea at rest The patient specifically denies any chest pain at rest or with exertion, orthopnea, paroxysmal nocturnal dyspnea, syncope, palpitations, focal neurological deficits, intermittent claudication, lower extremity edema, unexplained weight gain, hemoptysis or wheezing.  The patient also denies abdominal pain, nausea, vomiting, dysphagia, diarrhea, constipation, polyuria, polydipsia, dysuria, hematuria, frequency, urgency, abnormal bleeding or bruising, fever, chills, unexpected weight changes, mood  swings, change in skin or hair texture, change in voice quality, auditory or visual problems, allergic reactions or rashes, new musculoskeletal complaints other than usual "aches and pains".   HOME MEDICATIONS: Prescriptions prior to admission  Medication Sig Dispense Refill Last Dose  . ALPRAZolam (XANAX) 0.5 MG tablet Take 1 tablet (0.5 mg total) by mouth at bedtime as needed for anxiety. 30 tablet 0 unknown  . chlorpheniramine-HYDROcodone (TUSSIONEX PENNKINETIC ER) 10-8 MG/5ML LQCR Take 5 mLs by mouth every 12 (twelve) hours as needed for cough. 115 mL 0 unknown  . citalopram (CELEXA) 20 MG tablet Take 20 mg by mouth daily.   06/08/2014 at Unknown time  . doxycycline (VIBRAMYCIN) 100 MG capsule Take 1 capsule (100 mg total) by mouth 2 (two) times daily. 20 capsule 0   . predniSONE (DELTASONE) 20 MG tablet 3 tabs po daily x 3 days, then 2 tabs x 3 days, then 1.5 tabs x 3 days, then 1 tab x 3 days, then 0.5 tabs x 3 days 27 tablet 0     HOSPITAL MEDICATIONS: I have reviewed the patient's current medications. Scheduled: . sodium chloride   Intravenous STAT  . budesonide (PULMICORT) nebulizer solution  0.25 mg Nebulization BID  . citalopram  20 mg Oral Daily  . diltiazem  60 mg Oral 3 times per day  . enoxaparin (LOVENOX) injection  40 mg Subcutaneous Q24H  . guaiFENesin  600 mg Oral BID  . ipratropium  0.5 mg Nebulization Q6H  . levalbuterol  0.63 mg Nebulization Q6H  . levofloxacin (LEVAQUIN) IV  500 mg Intravenous Q24H  . methylPREDNISolone (SOLU-MEDROL) injection  60 mg Intravenous 3 times  per day  . senna  1 tablet Oral BID  . sodium chloride  3 mL Intravenous Q12H    VITALS: Blood pressure 145/77, pulse 122, temperature 98.4 F (36.9 C), temperature source Oral, resp. rate 20, height 6\' 1"  (1.854 m), weight 175 lb (79.379 kg), SpO2 95 %.  PHYSICAL EXAM:  General: Alert, oriented x3, no distress Head: no evidence of trauma, PERRL, EOMI, no exophtalmos or lid lag, no myxedema,  no xanthelasma; normal ears, nose and oropharynx Neck: normal jugular venous pulsations and no hepatojugular reflux; brisk carotid pulses without delay and no carotid bruits Chest: emphysematous, very weak breath sounds, no wheezes heard, hyperresonant, no signs of consolidation by percussion or palpation, normal fremitus, symmetrical and full respiratory excursions Cardiovascular: normal position and quality of the apical impulse, regular rhythm with frequent ectopy, very distant but otherwise normal first heart sound and normal second heart sound, no rubs or gallops, no murmur Abdomen: no tenderness or distention, no masses by palpation, no abnormal pulsatility or arterial bruits, normal bowel sounds, no hepatosplenomegaly Extremities: no clubbing, cyanosis;  bo edema; 2+ radial, ulnar and brachial pulses bilaterally; 2+ right femoral, posterior tibial and dorsalis pedis pulses; 2+ left femoral, posterior tibial and dorsalis pedis pulses; no subclavian or femoral bruits Neurological: grossly nonfocal   LABS  CBC  Recent Labs  06/09/14 0700 06/09/14 1512  WBC 10.1 11.5*  NEUTROABS 8.0*  --   HGB 14.4 15.5  HCT 44.9 47.5  MCV 94.1 95.6  PLT 124* 182*   Basic Metabolic Panel  Recent Labs  06/09/14 0700 06/09/14 1512  NA 144  --   K 4.3  --   CL 105  --   CO2 27  --   GLUCOSE 89  --   BUN 29*  --   CREATININE 1.04 1.07  CALCIUM 8.9  --       IMAGING: Dg Chest 2 View  06/09/2014   CLINICAL DATA:  Weakness shortness of breath with history of oxygen dependent COPD  EXAM: CHEST  2 VIEW  COMPARISON:  PA and lateral chest dated May 12, 2014  FINDINGS: There is marked hyperinflation. The pulmonary interstitial markings are chronically increased but are slightly more conspicuous today. There is no alveolar infiltrate, pneumothorax, nor pleural effusion. The heart and pulmonary vascularity are within the limits of normal. There is chronic deformity of the right fifth rib.   IMPRESSION: Severe COPD. Slightly increased pulmonary interstitial markings may reflect subsegmental atelectasis or interstitial pneumonia.   Electronically Signed   By: David  Martinique   On: 06/09/2014 07:57    ECG: MAT  TELEMETRY: NSR with frequent PACs  IMPRESSION: 1. MAT due to acute respiratory illness on background of chronic respiratory failure  RECOMMENDATION: 1. Aggressive treatment of hypoxemia and underlying acute lung disease 2. Diltiazem amy be of some benefit, but arrhythmia will improve in parallel with respiratory illness. 3. Echo  Time Spent Directly with Patient: 30 minutes  Sanda Klein, MD, [facility] [phone] office [phone] pager   06/09/2014, 4:58 PM

## 2014-06-10 ENCOUNTER — Inpatient Hospital Stay (HOSPITAL_COMMUNITY): Payer: Medicare HMO

## 2014-06-10 ENCOUNTER — Encounter (HOSPITAL_COMMUNITY): Payer: Self-pay

## 2014-06-10 DIAGNOSIS — I517 Cardiomegaly: Secondary | ICD-10-CM

## 2014-06-10 DIAGNOSIS — J9601 Acute respiratory failure with hypoxia: Secondary | ICD-10-CM

## 2014-06-10 DIAGNOSIS — R918 Other nonspecific abnormal finding of lung field: Secondary | ICD-10-CM

## 2014-06-10 LAB — CBC
HCT: 41.4 % (ref 39.0–52.0)
Hemoglobin: 13.4 g/dL (ref 13.0–17.0)
MCH: 30.8 pg (ref 26.0–34.0)
MCHC: 32.4 g/dL (ref 30.0–36.0)
MCV: 95.2 fL (ref 78.0–100.0)
PLATELETS: 123 10*3/uL — AB (ref 150–400)
RBC: 4.35 MIL/uL (ref 4.22–5.81)
RDW: 14.9 % (ref 11.5–15.5)
WBC: 10.4 10*3/uL (ref 4.0–10.5)

## 2014-06-10 LAB — BASIC METABOLIC PANEL
Anion gap: 12 (ref 5–15)
BUN: 32 mg/dL — AB (ref 6–23)
CO2: 28 mEq/L (ref 19–32)
Calcium: 8.7 mg/dL (ref 8.4–10.5)
Chloride: 104 mEq/L (ref 96–112)
Creatinine, Ser: 1.17 mg/dL (ref 0.50–1.35)
GFR calc non Af Amer: 58 mL/min — ABNORMAL LOW (ref >90)
GFR, EST AFRICAN AMERICAN: 67 mL/min — AB (ref >90)
Glucose, Bld: 137 mg/dL — ABNORMAL HIGH (ref 70–99)
Potassium: 4.8 mEq/L (ref 3.7–5.3)
SODIUM: 144 meq/L (ref 137–147)

## 2014-06-10 MED ORDER — LEVALBUTEROL HCL 0.63 MG/3ML IN NEBU
0.6300 mg | INHALATION_SOLUTION | Freq: Three times a day (TID) | RESPIRATORY_TRACT | Status: DC
  Administered 2014-06-10 – 2014-06-13 (×10): 0.63 mg via RESPIRATORY_TRACT
  Filled 2014-06-10 (×10): qty 3

## 2014-06-10 MED ORDER — IOHEXOL 350 MG/ML SOLN
100.0000 mL | Freq: Once | INTRAVENOUS | Status: AC | PRN
  Administered 2014-06-10: 100 mL via INTRAVENOUS

## 2014-06-10 MED ORDER — IPRATROPIUM BROMIDE 0.02 % IN SOLN
0.5000 mg | Freq: Three times a day (TID) | RESPIRATORY_TRACT | Status: DC
  Administered 2014-06-10 – 2014-06-13 (×10): 0.5 mg via RESPIRATORY_TRACT
  Filled 2014-06-10 (×10): qty 2.5

## 2014-06-10 MED ORDER — DILTIAZEM HCL 60 MG PO TABS
60.0000 mg | ORAL_TABLET | Freq: Four times a day (QID) | ORAL | Status: DC
Start: 2014-06-10 — End: 2014-06-12
  Administered 2014-06-10 – 2014-06-11 (×6): 60 mg via ORAL
  Filled 2014-06-10 (×7): qty 1

## 2014-06-10 NOTE — Progress Notes (Signed)
Patient Name: [name] Date of Encounter: 06/10/2014  Principal Problem:   Acute respiratory failure Active Problems:   Abnormal chest x-ray with multiple lung nodules   COPD exacerbation   COPD with acute exacerbation   Atrial tachycardia, multifocal   Primary Cardiologist: Dr. Sallyanne Kuster  Patient Profile: 80 yo male w/ severe O2 dep COPD, lung nodules was admitted 11/23 w/ COPD exacerbation and had MAT.   SUBJECTIVE: Feels the irreg HR, breathing a little better, not at baseline  OBJECTIVE Filed Vitals:   06/09/14 1558 06/09/14 1958 06/10/14 0500 06/10/14 0847  BP:  115/64 130/62   Pulse: 122 72 130   Temp:  98.3 F (36.8 C) 98 F (36.7 C)   TempSrc:   Oral   Resp:  20 20   Height:      Weight:   172 lb 8 oz (78.245 kg)   SpO2:  95% 95% 92%    Intake/Output Summary (Last 24 hours) at 06/10/14 0944 Last data filed at 06/10/14 0737  Gross per 24 hour  Intake     23 ml  Output    425 ml  Net   -402 ml   Filed Weights   06/09/14 0645 06/10/14 0500  Weight: 175 lb (79.379 kg) 172 lb 8 oz (78.245 kg)    PHYSICAL EXAM General: Well developed, well nourished, male in no acute distress. Head: Normocephalic, atraumatic.  Neck: Supple without bruits, JVD minimal elevation. Lungs:  Resp regular and unlabored, rales bilaterally, slight wheeze. Heart: slightly irreg, S1, S2, no S3, S4, or murmur; no rub. Abdomen: Soft, non-tender, non-distended, BS + x 4.  Extremities: No clubbing, cyanosis, no edema.  Neuro: Alert and oriented X 3. Moves all extremities spontaneously. Psych: Normal affect.  LABS: CBC: Recent Labs  06/09/14 0700 06/09/14 1512 06/10/14 0443  WBC 10.1 11.5* 10.4  NEUTROABS 8.0*  --   --   HGB 14.4 15.5 13.4  HCT 44.9 47.5 41.4  MCV 94.1 95.6 95.2  PLT 124* 125* 123*   INR:No results for input(s): INR in the last 72 hours. Basic Metabolic Panel: Recent Labs  06/09/14 0700 06/09/14 1512 06/10/14 0443  NA 144  --  144  K 4.3   --  4.8  CL 105  --  104  CO2 27  --  28  GLUCOSE 89  --  137*  BUN 29*  --  32*  CREATININE 1.04 1.07 1.17  CALCIUM 8.9  --  8.7   BNP: PRO B NATRIURETIC PEPTIDE (BNP)  Date/Time Value Ref Range Status  05/12/2014 03:25 PM 310.2 0 - 450 pg/mL Final  04/17/2010 04:27 PM 132.0* 0.0 - 100.0 pg/mL Final   TELE:   SR, plus MAT     Radiology/Studies: Dg Chest 2 View  06/09/2014   CLINICAL DATA:  Weakness shortness of breath with history of oxygen dependent COPD  EXAM: CHEST  2 VIEW  COMPARISON:  PA and lateral chest dated May 12, 2014  FINDINGS: There is marked hyperinflation. The pulmonary interstitial markings are chronically increased but are slightly more conspicuous today. There is no alveolar infiltrate, pneumothorax, nor pleural effusion. The heart and pulmonary vascularity are within the limits of normal. There is chronic deformity of the right fifth rib.  IMPRESSION: Severe COPD. Slightly increased pulmonary interstitial markings may reflect subsegmental atelectasis or interstitial pneumonia.   Electronically Signed   By: David  Martinique   On: 06/09/2014 07:57   Ct Angio Chest Pe W/cm &/or  Wo Cm  06/10/2014   CLINICAL DATA:  Shortness of breath.  EXAM: CT ANGIOGRAPHY CHEST WITH CONTRAST  TECHNIQUE: Multidetector CT imaging of the chest was performed using the standard protocol during bolus administration of intravenous contrast. Multiplanar CT image reconstructions and MIPs were obtained to evaluate the vascular anatomy.  CONTRAST:  151mL OMNIPAQUE IOHEXOL 350 MG/ML SOLN  COMPARISON:  04/17/2014  FINDINGS: The apical lungs are excluded from view. No visible pulmonary arteries are present at this level, and a pulmonary embolus should not be excluded do to this limitation. Pulmonary angiography is limited in the lower lobes, inferior middle lobe, and lingula secondary to extensive respiratory motion.  THORACIC INLET/BODY WALL:  No acute abnormality.  MEDIASTINUM:  Normal heart size. No  pericardial effusion. Lipomatous hypertrophy of the interatrial septum. No aortic dissection. No evidence of pulmonary embolism, as permitted by motion at the bases. No adenopathy.  LUNG WINDOWS:  Severe emphysema, diffuse and panlobular. There is diffuse bronchial wall thickening, especially in the lower lungs where there is also scattered mucoid impaction. No is superimposed edema or pneumonia identified. Scarring and atelectasis again noted in the right middle lobe. Trace layering right pleural effusion. No pneumothorax.  UPPER ABDOMEN:  No acute findings.  OSSEOUS:  Remote deformity of the right fifth rib.  Review of the MIP images confirms the above findings.  IMPRESSION: 1. No evidence of pulmonary embolism. Respiratory motion limits subsegmental evaluation at the bases. 2. Advanced emphysema. 3. Bronchitis with multi focal mucoid impaction and right middle lobe atelectasis. 4. Trace right pleural effusion.   Electronically Signed   By: Jorje Guild M.D.   On: 06/10/2014 01:29     Current Medications:  . budesonide (PULMICORT) nebulizer solution  0.25 mg Nebulization BID  . citalopram  20 mg Oral Daily  . diltiazem  60 mg Oral 3 times per day  . enoxaparin (LOVENOX) injection  40 mg Subcutaneous Q24H  . guaiFENesin  600 mg Oral BID  . ipratropium  0.5 mg Nebulization Q6H  . levalbuterol  0.63 mg Nebulization Q6H  . levofloxacin (LEVAQUIN) IV  500 mg Intravenous Q24H  . methylPREDNISolone (SOLU-MEDROL) injection  60 mg Intravenous 3 times per day  . senna  1 tablet Oral BID  . sodium chloride  3 mL Intravenous Q12H      ASSESSMENT AND PLAN: Principal Problem:   Acute respiratory failure - per IM  Active Problems:   Abnormal chest x-ray with multiple lung nodules- per IM    COPD exacerbation- per IM    COPD with acute exacerbation- per IM    Atrial tachycardia, multifocal - Still has, echo pending, dilt 60 mg tid started yesterday, HR is improved, continue to  follow.   Signed, Rosaria Ferries , PA-C 9:44 AM 06/10/2014    The patient was seen, examined and discussed with Rosaria Ferries, PA-C and I agree with the above.   80 year old male with acute hypoxic respiratory failure and MAT - commonly seen with respiratory failure and use of beta agonists. Treat underlying disease, continue short acting Diltiazem 60 mg PO QID. We will follow. Echo is pending.   Dorothy Spark 06/10/2014

## 2014-06-10 NOTE — Progress Notes (Signed)
  Echocardiogram 2D Echocardiogram has been performed.  Darlina Sicilian M 06/10/2014, 10:45 AM

## 2014-06-10 NOTE — Progress Notes (Signed)
PATIENT DETAILS Name: [name] Age: 80 y.o. Sex: male Date of Birth: [birth year] Admit Date: 06/09/2014 Admitting Physician Evalee Mutton Kristeen Mans, MD UYQ:[id number],VZDGLO D, MD  Subjective: Feels a bit better compared to yesterday.  Assessment/Plan: Principal Problem:   Acute on chronic hypoxic respiratory failure: Secondary to COPD exacerbation. Continue nebs, Levaquin and IV Solu-Medrol. Encourage incentive spirometry/flutter valve. Slowly titrate down oxygen to usual home regimen. CTA chest negative for pulmonary embolism. Await echocardiogram.  Active Problems:   COPD exacerbation: Somewhat improved compared to admission. Has decreased air entry at bilateral bases, but no significant wheezing. We'll continue with nebs/Levaquin/Solu-Medrol. Will need pulmonary follow-up as outpatient. Not yet back to baseline. Need a few more days of hospitalization.    Atrial tachycardia, multifocal: Heart rate much better, continue Cardizem. Awaiting echo. Appreciate cardiology eval.    Hypertension: Controlled with Cardizem. Follow BP and titrate medications.    Abnormal chest x-ray with multiple lung nodules: Chronic problem. Followed by [facility] as outpatient    Disposition: Remain inpatient  Antibiotics:  See below   Anti-infectives    Start     Dose/Rate Route Frequency Ordered Stop   06/09/14 1400  levofloxacin (LEVAQUIN) IVPB 500 mg     500 mg100 mL/hr over 60 Minutes Intravenous Every 24 hours 06/09/14 1316        DVT Prophylaxis: Prophylactic Lovenox   Code Status: DNR  Family Communication None at bedside  Procedures:  None  CONSULTS:  cardiology  Time spent 40 minutes-which includes 50% of the time with face-to-face with patient/ family and coordinating care related to the above assessment and plan.  MEDICATIONS: Scheduled Meds: . budesonide (PULMICORT) nebulizer solution  0.25 mg Nebulization BID  . citalopram  20 mg Oral Daily  . diltiazem  60 mg  Oral 3 times per day  . enoxaparin (LOVENOX) injection  40 mg Subcutaneous Q24H  . guaiFENesin  600 mg Oral BID  . ipratropium  0.5 mg Nebulization Q6H  . levalbuterol  0.63 mg Nebulization Q6H  . levofloxacin (LEVAQUIN) IV  500 mg Intravenous Q24H  . methylPREDNISolone (SOLU-MEDROL) injection  60 mg Intravenous 3 times per day  . senna  1 tablet Oral BID  . sodium chloride  3 mL Intravenous Q12H   Continuous Infusions:  PRN Meds:.acetaminophen **OR** acetaminophen, albuterol, ALPRAZolam, alum & mag hydroxide-simeth, chlorpheniramine-HYDROcodone, diltiazem, guaiFENesin-dextromethorphan, morphine injection, ondansetron **OR** ondansetron (ZOFRAN) IV, oxyCODONE    PHYSICAL EXAM: Vital signs in last 24 hours: Filed Vitals:   06/09/14 1558 06/09/14 1958 06/10/14 0500 06/10/14 0847  BP:  115/64 130/62   Pulse: 122 72 130   Temp:  98.3 F (36.8 C) 98 F (36.7 C)   TempSrc:   Oral   Resp:  20 20   Height:      Weight:   78.245 kg (172 lb 8 oz)   SpO2:  95% 95% 92%    Weight change: -1.134 kg (-2 lb 8 oz) Filed Weights   06/09/14 0645 06/10/14 0500  Weight: 79.379 kg (175 lb) 78.245 kg (172 lb 8 oz)   Body mass index is 22.76 kg/(m^2).   Gen Exam: Awake and alert with clear speech. Not in any distress. Neck: Supple, No JVD.   Chest: Continues to have decreased entry at bilateral bases, but no obvious wheezing. CVS: S1 S2 Regular, no murmurs.  Abdomen: soft, BS +, non tender, non distended.  Extremities: no edema, lower extremities warm to touch. Neurologic: Non Focal.  Skin: No Rash.   Wounds: N/A.   Intake/Output from previous day:  Intake/Output Summary (Last 24 hours) at 06/10/14 1012 Last data filed at 06/10/14 0837  Gross per 24 hour  Intake     23 ml  Output    425 ml  Net   -402 ml     LAB RESULTS: CBC  Recent Labs Lab 06/09/14 0700 06/09/14 1512 06/10/14 0443  WBC 10.1 11.5* 10.4  HGB 14.4 15.5 13.4  HCT 44.9 47.5 41.4  PLT 124* 125* 123*  MCV  94.1 95.6 95.2  MCH 30.2 31.2 30.8  MCHC 32.1 32.6 32.4  RDW 14.7 14.6 14.9  LYMPHSABS 1.2  --   --   MONOABS 0.9  --   --   EOSABS 0.0  --   --   BASOSABS 0.0  --   --     Chemistries   Recent Labs Lab 06/09/14 0700 06/09/14 1512 06/10/14 0443  NA 144  --  144  K 4.3  --  4.8  CL 105  --  104  CO2 27  --  28  GLUCOSE 89  --  137*  BUN 29*  --  32*  CREATININE 1.04 1.07 1.17  CALCIUM 8.9  --  8.7    CBG: No results for input(s): GLUCAP in the last 168 hours.  GFR Estimated Creatinine Clearance: 57.6 mL/min (by C-G formula based on Cr of 1.17).  Coagulation profile No results for input(s): INR, PROTIME in the last 168 hours.  Cardiac Enzymes No results for input(s): CKMB, TROPONINI, MYOGLOBIN in the last 168 hours.  Invalid input(s): CK  Invalid input(s): POCBNP No results for input(s): DDIMER in the last 72 hours. No results for input(s): HGBA1C in the last 72 hours. No results for input(s): CHOL, HDL, LDLCALC, TRIG, CHOLHDL, LDLDIRECT in the last 72 hours. No results for input(s): TSH, T4TOTAL, T3FREE, THYROIDAB in the last 72 hours.  Invalid input(s): FREET3 No results for input(s): VITAMINB12, FOLATE, FERRITIN, TIBC, IRON, RETICCTPCT in the last 72 hours. No results for input(s): LIPASE, AMYLASE in the last 72 hours.  Urine Studies No results for input(s): UHGB, CRYS in the last 72 hours.  Invalid input(s): UACOL, UAPR, USPG, UPH, UTP, UGL, UKET, UBIL, UNIT, UROB, ULEU, UEPI, UWBC, URBC, UBAC, CAST, UCOM, BILUA  MICROBIOLOGY: No results found for this or any previous visit (from the past 240 hour(s)).  RADIOLOGY STUDIES/RESULTS: Dg Chest 2 View  06/09/2014   CLINICAL DATA:  Weakness shortness of breath with history of oxygen dependent COPD  EXAM: CHEST  2 VIEW  COMPARISON:  PA and lateral chest dated May 12, 2014  FINDINGS: There is marked hyperinflation. The pulmonary interstitial markings are chronically increased but are slightly more  conspicuous today. There is no alveolar infiltrate, pneumothorax, nor pleural effusion. The heart and pulmonary vascularity are within the limits of normal. There is chronic deformity of the right fifth rib.  IMPRESSION: Severe COPD. Slightly increased pulmonary interstitial markings may reflect subsegmental atelectasis or interstitial pneumonia.   Electronically Signed   By: David  Martinique   On: 06/09/2014 07:57   Dg Chest 2 View  05/12/2014   CLINICAL DATA:  Cough. Short of breath increasing over the last several days. Long time smoker. Hypertension, emphysema.  EXAM: CHEST  2 VIEW  COMPARISON:  Chest CT, 04/17/2014.  Chest radiograph, 09/12/2013.  FINDINGS: Cardiac silhouette is normal in size and configuration. No mediastinal or hilar masses or evidence of adenopathy.  Lungs are hyperexpanded.  There are stable areas of lung scarring. Deformity of the right fifth rib is stable. No pleural effusion or pneumothorax. Bony thorax is demineralized.  IMPRESSION: 1. Advanced COPD. 2. No acute cardiopulmonary disease.   Electronically Signed   By: Lajean Manes M.D.   On: 05/12/2014 16:36   Ct Angio Chest Pe W/cm &/or Wo Cm  06/10/2014   CLINICAL DATA:  Shortness of breath.  EXAM: CT ANGIOGRAPHY CHEST WITH CONTRAST  TECHNIQUE: Multidetector CT imaging of the chest was performed using the standard protocol during bolus administration of intravenous contrast. Multiplanar CT image reconstructions and MIPs were obtained to evaluate the vascular anatomy.  CONTRAST:  185mL OMNIPAQUE IOHEXOL 350 MG/ML SOLN  COMPARISON:  04/17/2014  FINDINGS: The apical lungs are excluded from view. No visible pulmonary arteries are present at this level, and a pulmonary embolus should not be excluded do to this limitation. Pulmonary angiography is limited in the lower lobes, inferior middle lobe, and lingula secondary to extensive respiratory motion.  THORACIC INLET/BODY WALL:  No acute abnormality.  MEDIASTINUM:  Normal heart size. No  pericardial effusion. Lipomatous hypertrophy of the interatrial septum. No aortic dissection. No evidence of pulmonary embolism, as permitted by motion at the bases. No adenopathy.  LUNG WINDOWS:  Severe emphysema, diffuse and panlobular. There is diffuse bronchial wall thickening, especially in the lower lungs where there is also scattered mucoid impaction. No is superimposed edema or pneumonia identified. Scarring and atelectasis again noted in the right middle lobe. Trace layering right pleural effusion. No pneumothorax.  UPPER ABDOMEN:  No acute findings.  OSSEOUS:  Remote deformity of the right fifth rib.  Review of the MIP images confirms the above findings.  IMPRESSION: 1. No evidence of pulmonary embolism. Respiratory motion limits subsegmental evaluation at the bases. 2. Advanced emphysema. 3. Bronchitis with multi focal mucoid impaction and right middle lobe atelectasis. 4. Trace right pleural effusion.   Electronically Signed   By: Jorje Guild M.D.   On: 06/10/2014 01:29    Oren Binet, MD  [facility] Pager:[phone]  If 7PM-7AM, please contact night-coverage [email or web address] Password [email or web address] 06/10/2014, 10:12 AM   LOS: 1 day

## 2014-06-11 DIAGNOSIS — I491 Atrial premature depolarization: Secondary | ICD-10-CM

## 2014-06-11 MED ORDER — LISINOPRIL 5 MG PO TABS: 5.0000 mg | ORAL_TABLET | Freq: Every day | ORAL | Status: DC

## 2014-06-11 MED ORDER — PREDNISONE 20 MG PO TABS
50.0000 mg | ORAL_TABLET | Freq: Every day | ORAL | Status: DC
  Administered 2014-06-12 – 2014-06-13 (×2): 50 mg via ORAL
  Filled 2014-06-11: qty 2
  Filled 2014-06-11: qty 1
  Filled 2014-06-11: qty 2
  Filled 2014-06-11: qty 1

## 2014-06-11 MED ORDER — LOSARTAN POTASSIUM 50 MG PO TABS
50.0000 mg | ORAL_TABLET | Freq: Every day | ORAL | Status: DC
  Administered 2014-06-11 – 2014-06-13 (×3): 50 mg via ORAL
  Filled 2014-06-11 (×4): qty 1

## 2014-06-11 NOTE — Progress Notes (Signed)
INITIAL NUTRITION ASSESSMENT  DOCUMENTATION CODES Per approved criteria  -Not Applicable   INTERVENTION:  Ensure Complete PO BID, each supplement provides 350 kcal and 13 grams of protein  NUTRITION DIAGNOSIS: Inadequate oral intake related to acute poor appetite as evidenced by 10-50% meal completion.   Goal: Intake to meet >90% of estimated nutrition needs.  Monitor:  PO intake, labs, weight trend.  Reason for Assessment: MD Consult  80 y.o. male  Admitting Dx: Acute respiratory failure  ASSESSMENT: 80 y.o. male, with PMH of asthma, COPD- emphysema on 3L O2 at home, hypertension, and pulmonary nodules presented to the ED on 11/23 with shortness of breath.  Patient reports good intake PTA. Eating okay now, but less than at home. He ate a [place] sandwich and cup of pudding for lunch today. He was shocked that his weight is up to 189 lb. He weighed 220 lb a few years ago. Weight stable at ~177-178 lb since Feb 2014. Mild muscle depletion noted in clavicle and temple areas.  Nutrition Focused Physical Exam:  Subcutaneous Fat:  Orbital Region: WNL Upper Arm Region: WNL Thoracic and Lumbar Region: WNL  Muscle:  Temple Region: mild depletion Clavicle Bone Region: mild depletion Clavicle and Acromion Bone Region: mild depletion Scapular Bone Region: WNL Dorsal Hand: WNL Patellar Region: WNL Anterior Thigh Region: WNL Posterior Calf Region: WNL  Edema: none   Height: Ht Readings from Last 1 Encounters:  06/09/14 6\' 1"  (1.854 m)    Weight: Wt Readings from Last 1 Encounters:  06/11/14 189 lb (85.73 kg)    Ideal Body Weight: 83.6 kg  % Ideal Body Weight: 103%  Wt Readings from Last 10 Encounters:  06/11/14 189 lb (85.73 kg)  04/17/14 178 lb (80.74 kg)  09/26/13 178 lb (80.74 kg)  09/12/13 178 lb 5.6 oz (80.9 kg)  06/06/13 177 lb 3 oz (80.372 kg)  06/06/13 177 lb 4.8 oz (80.423 kg)  09/06/12 178 lb (80.74 kg)    Usual Body Weight: 177-178 lb  % Usual  Body Weight: 106%  BMI:  Body mass index is 24.94 kg/(m^2).  Estimated Nutritional Needs: Kcal: 1950-2150 Protein: 110-130 gm Fluid: >/= 2 L  Skin: intact  Diet Order: Diet Heart  EDUCATION NEEDS: -Education needs addressed, discussed heart healthy diet, but patient said he already has this information at home and needs no further education.   Intake/Output Summary (Last 24 hours) at 06/11/14 1420 Last data filed at 06/11/14 0545  Gross per 24 hour  Intake      3 ml  Output    500 ml  Net   -497 ml    Last BM: 11/23   Labs:   Recent Labs Lab 06/09/14 0700 06/09/14 1512 06/10/14 0443  NA 144  --  144  K 4.3  --  4.8  CL 105  --  104  CO2 27  --  28  BUN 29*  --  32*  CREATININE 1.04 1.07 1.17  CALCIUM 8.9  --  8.7  GLUCOSE 89  --  137*    CBG (last 3)  No results for input(s): GLUCAP in the last 72 hours.  Scheduled Meds: . budesonide (PULMICORT) nebulizer solution  0.25 mg Nebulization BID  . citalopram  20 mg Oral Daily  . diltiazem  60 mg Oral 4 times per day  . enoxaparin (LOVENOX) injection  40 mg Subcutaneous Q24H  . guaiFENesin  600 mg Oral BID  . ipratropium  0.5 mg Nebulization TID  . levalbuterol  0.63 mg Nebulization TID  . levofloxacin (LEVAQUIN) IV  500 mg Intravenous Q24H  . losartan  50 mg Oral Daily  . [START ON 06/12/2014] predniSONE  50 mg Oral Q breakfast  . senna  1 tablet Oral BID  . sodium chloride  3 mL Intravenous Q12H    Continuous Infusions:   Past Medical History  Diagnosis Date  . Asthma   . COPD (chronic obstructive pulmonary disease)   . Emphysema of lung   . Hypertension   . On home O2     3L N/C continuous  . Multiple lung nodules   . Shortness of breath dyspnea   . Anxiety   . Depression     Past Surgical History  Procedure Laterality Date  . Lump removed from neck  2004  . Hernia repair  2008    Molli Barrows, RD, LDN, [place] Pager [phone] After Hours Pager [phone]

## 2014-06-11 NOTE — Clinical Documentation Improvement (Signed)
  [name] was admitted on 06/09/14 with Acute on Chronic hypoxic respiratory failure likely due to acute exacerbation of COPD. "Mild systolic CHF" is documented in the progress note on 11/25. If possible could you please help by providing the ACUITY (acute, chronic, or acute on chronic) for the systolic heart failure in the next progress note.   Thank you for your time with this!   Almon Register, RN Clinical Documentation Improvement Specialist ([phone] / [phone]

## 2014-06-11 NOTE — Progress Notes (Signed)
[facility] PROGRESS NOTE  CHARAN PRIETO UMP:[id number] DOB: 01/16/1934 DOA: 06/09/2014 PCP: Kandice Hams, MD Brief narrative  Assessment/Plan: Acute on chronic hypoxic respiratory failure secondary to COPD exacerbation Continue nebs and Levaquin. Transition to be Solu-Medrol to oral prednisone. Continue incentive spirometry/flutter involved. Intending O2 sats on 3 L via nasal cannula (on chronic 3 L oxygen at home) -CT angiogram chest negative for PE -2-D echo shows mild systolic CHF with EF of 40-08% and grade 1 diastolic dysfunction. No wall motion abnormality. Will verify home medications from his pharmacy. Reports being on Advair and albuterol nebs. New Appointment established with pulmonary Dr. Melvyn Novas for next week.  Multifocal atrial tachycardia Started on Cardizem 4 times a day and heart rate is currently stable. 2-D echo shows mild systolic dysfunction. Appreciate cardiology follow-up. Possibly transition to long-acting Cardizem in a.m. if heart rate stable  Mild systolic CHF Appears euvolemic. Continue losartan.  Generalized weakness and protein calorie malnutrition PT and nutrition consult requested  DVT prophylaxis Subcutaneous Lovenox  Diet: Heart healthy   Code Status: Full code Family Communication: Son at bedside Disposition Plan: Home likely in next 24-48 hours   Consultants:  Cardiology  Procedures:  2D echo  Antibiotics:  Levaquin  HPI/Subjective:  reports his breathing to be slightly better but still feels weak stable on telemetry overnight  Objective: Filed Vitals:   06/11/14 0514  BP: 138/81  Pulse: 80  Temp: 97.5 F (36.4 C)  Resp: 18    Intake/Output Summary (Last 24 hours) at 06/11/14 1225 Last data filed at 06/11/14 0545  Gross per 24 hour  Intake    103 ml  Output    600 ml  Net   -497 ml   Filed Weights   06/09/14 0645 06/10/14 0500 06/11/14 0514  Weight: 79.379 kg (175 lb) 78.245 kg (172 lb 8 oz) 85.73 kg (189  lb)    Exam:   General:  Elderly thin built male in no acute distress  HEENT: No pallor, moist oral mucosa, temporal wasting  Chest: Equal air entry bilaterally, no rhonchi, wheeze or crackles  CVS: Normal S1 and S2, no murmurs or gallop  Abdomen: Soft, nondistended, nontender, bowel sounds present  Extremity: Warm, no edema  CNS: Alert and oriented, hard of hearing    Data Reviewed: Basic Metabolic Panel:  Recent Labs Lab 06/09/14 0700 06/09/14 1512 06/10/14 0443  NA 144  --  144  K 4.3  --  4.8  CL 105  --  104  CO2 27  --  28  GLUCOSE 89  --  137*  BUN 29*  --  32*  CREATININE 1.04 1.07 1.17  CALCIUM 8.9  --  8.7   Liver Function Tests: No results for input(s): AST, ALT, ALKPHOS, BILITOT, PROT, ALBUMIN in the last 168 hours. No results for input(s): LIPASE, AMYLASE in the last 168 hours. No results for input(s): AMMONIA in the last 168 hours. CBC:  Recent Labs Lab 06/09/14 0700 06/09/14 1512 06/10/14 0443  WBC 10.1 11.5* 10.4  NEUTROABS 8.0*  --   --   HGB 14.4 15.5 13.4  HCT 44.9 47.5 41.4  MCV 94.1 95.6 95.2  PLT 124* 125* 123*   Cardiac Enzymes: No results for input(s): CKTOTAL, CKMB, CKMBINDEX, TROPONINI in the last 168 hours. BNP (last 3 results)  Recent Labs  05/12/14 1525  PROBNP 310.2   CBG: No results for input(s): GLUCAP in the last 168 hours.  No results found for this or any previous visit (  from the past 240 hour(s)).   Studies: Ct Angio Chest Pe W/cm &/or Wo Cm  06/10/2014   CLINICAL DATA:  Shortness of breath.  EXAM: CT ANGIOGRAPHY CHEST WITH CONTRAST  TECHNIQUE: Multidetector CT imaging of the chest was performed using the standard protocol during bolus administration of intravenous contrast. Multiplanar CT image reconstructions and MIPs were obtained to evaluate the vascular anatomy.  CONTRAST:  158mL OMNIPAQUE IOHEXOL 350 MG/ML SOLN  COMPARISON:  04/17/2014  FINDINGS: The apical lungs are excluded from view. No visible  pulmonary arteries are present at this level, and a pulmonary embolus should not be excluded do to this limitation. Pulmonary angiography is limited in the lower lobes, inferior middle lobe, and lingula secondary to extensive respiratory motion.  THORACIC INLET/BODY WALL:  No acute abnormality.  MEDIASTINUM:  Normal heart size. No pericardial effusion. Lipomatous hypertrophy of the interatrial septum. No aortic dissection. No evidence of pulmonary embolism, as permitted by motion at the bases. No adenopathy.  LUNG WINDOWS:  Severe emphysema, diffuse and panlobular. There is diffuse bronchial wall thickening, especially in the lower lungs where there is also scattered mucoid impaction. No is superimposed edema or pneumonia identified. Scarring and atelectasis again noted in the right middle lobe. Trace layering right pleural effusion. No pneumothorax.  UPPER ABDOMEN:  No acute findings.  OSSEOUS:  Remote deformity of the right fifth rib.  Review of the MIP images confirms the above findings.  IMPRESSION: 1. No evidence of pulmonary embolism. Respiratory motion limits subsegmental evaluation at the bases. 2. Advanced emphysema. 3. Bronchitis with multi focal mucoid impaction and right middle lobe atelectasis. 4. Trace right pleural effusion.   Electronically Signed   By: Jorje Guild M.D.   On: 06/10/2014 01:29    Scheduled Meds: . budesonide (PULMICORT) nebulizer solution  0.25 mg Nebulization BID  . citalopram  20 mg Oral Daily  . diltiazem  60 mg Oral 4 times per day  . enoxaparin (LOVENOX) injection  40 mg Subcutaneous Q24H  . guaiFENesin  600 mg Oral BID  . ipratropium  0.5 mg Nebulization TID  . levalbuterol  0.63 mg Nebulization TID  . levofloxacin (LEVAQUIN) IV  500 mg Intravenous Q24H  . losartan  50 mg Oral Daily  . methylPREDNISolone (SOLU-MEDROL) injection  60 mg Intravenous 3 times per day  . senna  1 tablet Oral BID  . sodium chloride  3 mL Intravenous Q12H   Continuous Infusions:      Time spent: 25 minutes    Kassey Laforest, [place]  [facility] Pager [phone]. If 7PM-7AM, please contact night-coverage at [email or web address], password [facility] 06/11/2014, 12:25 PM  LOS: 2 days

## 2014-06-11 NOTE — Progress Notes (Signed)
Patient Name: [name] Date of Encounter: 06/11/2014  Principal Problem:   Acute respiratory failure Active Problems:   Abnormal chest x-ray with multiple lung nodules   COPD exacerbation   COPD with acute exacerbation   Atrial tachycardia, multifocal   Primary Cardiologist: 80 yo male w/ severe O2 dep COPD, lung nodules was admitted 11/23 w/ COPD exacerbation and had MAT.   Patient Profile: Dr. Sallyanne Kuster  SUBJECTIVE: Breathing better today, no chest pain or palpitations.  OBJECTIVE Filed Vitals:   06/10/14 1423 06/10/14 1935 06/10/14 2105 06/11/14 0514  BP:   114/67 138/81  Pulse:  68 61 80  Temp:   98.1 F (36.7 C) 97.5 F (36.4 C)  TempSrc:   Oral   Resp:  18 18 18   Height:      Weight:    189 lb (85.73 kg)  SpO2: 92% 92% 93% 94%    Intake/Output Summary (Last 24 hours) at 06/11/14 0904 Last data filed at 06/11/14 0545  Gross per 24 hour  Intake    103 ml  Output    600 ml  Net   -497 ml   Filed Weights   06/09/14 0645 06/10/14 0500 06/11/14 0514  Weight: 175 lb (79.379 kg) 172 lb 8 oz (78.245 kg) 189 lb (85.73 kg)    PHYSICAL EXAM General: Well developed, well nourished, male in no acute distress. Head: Normocephalic, atraumatic.  Neck: Supple without bruits, JVD not elevated. Lungs:  Resp regular and unlabored, decreased BS bases w/ some rales. Heart: slightly irreg, S1, S2, no S3, S4, or murmur; no rub. Abdomen: Soft, non-tender, non-distended, BS + x 4.  Extremities: No clubbing, cyanosis, no edema.  Neuro: Alert and oriented X 3. Moves all extremities spontaneously. Psych: Normal affect.  LABS: CBC: Recent Labs  06/09/14 0700 06/09/14 1512 06/10/14 0443  WBC 10.1 11.5* 10.4  NEUTROABS 8.0*  --   --   HGB 14.4 15.5 13.4  HCT 44.9 47.5 41.4  MCV 94.1 95.6 95.2  PLT 124* 125* 818*   Basic Metabolic Panel: Recent Labs  06/09/14 0700 06/09/14 1512 06/10/14 0443  NA 144  --  144  K 4.3  --  4.8  CL 105  --  104  CO2 27   --  28  GLUCOSE 89  --  137*  BUN 29*  --  32*  CREATININE 1.04 1.07 1.17  CALCIUM 8.9  --  8.7   TELE:  MAT, No afib seen  Echo: 06/10/2014 Conclusions - Left ventricle: Not well visualized. The cavity size was normal. There was mild focal basal hypertrophy of the septum. Systolic function was mildly reduced. The estimated ejection fraction was in the range of 45% to 50%. This is a gross estimation. Wall motion was normal; there were no regional wall motion abnormalities. Doppler parameters are consistent with abnormal left ventricular relaxation (grade 1 diastolic dysfunction).  Radiology/Studies: Ct Angio Chest Pe W/cm &/or Wo Cm 06/10/2014   CLINICAL DATA:  Shortness of breath.  EXAM: CT ANGIOGRAPHY CHEST WITH CONTRAST  TECHNIQUE: Multidetector CT imaging of the chest was performed using the standard protocol during bolus administration of intravenous contrast. Multiplanar CT image reconstructions and MIPs were obtained to evaluate the vascular anatomy.  CONTRAST:  111mL OMNIPAQUE IOHEXOL 350 MG/ML SOLN  COMPARISON:  04/17/2014  FINDINGS: The apical lungs are excluded from view. No visible pulmonary arteries are present at this level, and a pulmonary embolus should not be excluded do to this limitation.  Pulmonary angiography is limited in the lower lobes, inferior middle lobe, and lingula secondary to extensive respiratory motion.  THORACIC INLET/BODY WALL:  No acute abnormality.  MEDIASTINUM:  Normal heart size. No pericardial effusion. Lipomatous hypertrophy of the interatrial septum. No aortic dissection. No evidence of pulmonary embolism, as permitted by motion at the bases. No adenopathy.  LUNG WINDOWS:  Severe emphysema, diffuse and panlobular. There is diffuse bronchial wall thickening, especially in the lower lungs where there is also scattered mucoid impaction. No is superimposed edema or pneumonia identified. Scarring and atelectasis again noted in the right middle lobe.  Trace layering right pleural effusion. No pneumothorax.  UPPER ABDOMEN:  No acute findings.  OSSEOUS:  Remote deformity of the right fifth rib.  Review of the MIP images confirms the above findings.  IMPRESSION: 1. No evidence of pulmonary embolism. Respiratory motion limits subsegmental evaluation at the bases. 2. Advanced emphysema. 3. Bronchitis with multi focal mucoid impaction and right middle lobe atelectasis. 4. Trace right pleural effusion.   Electronically Signed   By: Jorje Guild M.D.   On: 06/10/2014 01:29     Current Medications:  . budesonide (PULMICORT) nebulizer solution  0.25 mg Nebulization BID  . citalopram  20 mg Oral Daily  . diltiazem  60 mg Oral 4 times per day  . enoxaparin (LOVENOX) injection  40 mg Subcutaneous Q24H  . guaiFENesin  600 mg Oral BID  . ipratropium  0.5 mg Nebulization TID  . levalbuterol  0.63 mg Nebulization TID  . levofloxacin (LEVAQUIN) IV  500 mg Intravenous Q24H  . losartan  50 mg Oral Daily  . methylPREDNISolone (SOLU-MEDROL) injection  60 mg Intravenous 3 times per day  . senna  1 tablet Oral BID  . sodium chloride  3 mL Intravenous Q12H      ASSESSMENT AND PLAN: Principal Problem:   Acute respiratory failure Active Problems:   Abnormal chest x-ray with multiple lung nodules   COPD exacerbation   COPD with acute exacerbation   Atrial tachycardia, multifocal  Principal Problem:  Acute respiratory failure - per IM  Active Problems:  Abnormal chest x-ray with multiple lung nodules- per IM   COPD exacerbation- per IM   COPD with acute exacerbation- per IM   Atrial tachycardia, multifocal - Still has, echo w/ mild LVD, no WMA, dilt 60 mg tid started 11/23, increased to qid 11/24, HR is improved, continue to follow. Change to CD once dose established.   Signed, Rosaria Ferries , PA-C 9:04 AM 06/11/2014   The patient was seen, examined and discussed with Rosaria Ferries, PA-C and I agree with the above.   The patient  continues to have fair amount of atrial ectopy, again improvement is expected with resolution of underlying lung prblem. Meanwhile continue short acting Diltiazem. We will follow.  Dorothy Spark 06/11/2014

## 2014-06-11 NOTE — Evaluation (Signed)
Physical Therapy Evaluation Patient Details Name: [name] MRN: [id number] DOB: 1934/05/18 Today's Date: 06/11/2014   History of Present Illness  Patient admitted with PMH of asthma, COPD- emphysema on 3L O2 at home, hypertension, and pulmonary nodules presented to the ED with shortness of breath. Work up revealed Acute on Chronic respiratory failure and COPD exacerbation.  Clinical Impression  Patient limited in ambulation due to decreased balance and limited cardiopulmonary status (decreased O2 sats).  Feel patient will benefit from PT to increase independence and mobility for return home.      Follow Up Recommendations Home health PT    Equipment Recommendations       Recommendations for Other Services       Precautions / Restrictions Precautions Precautions: Fall      Mobility  Bed Mobility Overal bed mobility: Modified Independent             General bed mobility comments: uses rail  Transfers Overall transfer level: Needs assistance Equipment used: None Transfers: Sit to/from Stand Sit to Stand: Min guard         General transfer comment: very unsteady during transfer and upon reaching upright, able to self regain  Ambulation/Gait Ambulation/Gait assistance: Min guard Ambulation Distance (Feet): 50 Feet Assistive device: None (did use hall rail on occasion) Gait Pattern/deviations: Step-to pattern;Steppage;Decreased weight shift to right;Decreased weight shift to left;Decreased stride length Gait velocity: severly decreased Gait velocity interpretation: <1.8 ft/sec, indicative of risk for recurrent falls General Gait Details: appeared unsteady but never required assistance to maintain balance.    Stairs            Wheelchair Mobility    Modified Rankin (Stroke Patients Only)       Balance Overall balance assessment: Needs assistance Sitting-balance support: No upper extremity supported;Feet supported Sitting balance-Leahy Scale:  Good     Standing balance support: No upper extremity supported Standing balance-Leahy Scale: Fair                               Pertinent Vitals/Pain Pain Assessment: No/denies pain    Home Living Family/patient expects to be discharged to:: Private residence Living Arrangements: Spouse/significant other Available Help at Discharge: Family (reports wife does not help much) Type of Home: House       Home Layout: Two level;Bed/bath upstairs Home Equipment: None      Prior Function Level of Independence: Independent               Hand Dominance        Extremity/Trunk Assessment   Upper Extremity Assessment: Generalized weakness           Lower Extremity Assessment: Generalized weakness      Cervical / Trunk Assessment: Normal  Communication   Communication: No difficulties  Cognition Arousal/Alertness: Awake/alert Behavior During Therapy: WFL for tasks assessed/performed Overall Cognitive Status: Within Functional Limits for tasks assessed                      General Comments      Exercises General Exercises - Lower Extremity Ankle Circles/Pumps: AROM;Both;5 reps;Seated      Assessment/Plan    PT Assessment Patient needs continued PT services  PT Diagnosis Generalized weakness   PT Problem List Decreased strength;Decreased activity tolerance;Decreased balance;Decreased mobility;Cardiopulmonary status limiting activity  PT Treatment Interventions Gait training;Functional mobility training;Therapeutic activities;Therapeutic exercise;Patient/family education;Balance training   PT Goals (Current goals can be  found in the Care Plan section) Acute Rehab PT Goals Patient Stated Goal: stay here until Christmas PT Goal Formulation: With patient Time For Goal Achievement: 06/21/14 Potential to Achieve Goals: Good    Frequency Min 3X/week   Barriers to discharge Decreased caregiver support      Co-evaluation                End of Session Equipment Utilized During Treatment:  (deferred use of gait belit) Activity Tolerance: Patient limited by fatigue Patient left: in bed;with call bell/phone within reach;with nursing/sitter in room (sitting EOB) Nurse Communication: Mobility status (o2 sats during gait)         Time: 1308-6578 PT Time Calculation (min) (ACUTE ONLY): 19 min   Charges:   PT Evaluation $Initial PT Evaluation Tier I: 1 Procedure PT Treatments $Gait Training: 8-22 mins   PT G CodesShanna Cisco, [place] 06/11/2014, 2:23 PM

## 2014-06-12 DIAGNOSIS — R001 Bradycardia, unspecified: Secondary | ICD-10-CM

## 2014-06-12 DIAGNOSIS — R0602 Shortness of breath: Secondary | ICD-10-CM

## 2014-06-12 MED ORDER — LEVOFLOXACIN 500 MG PO TABS
500.0000 mg | ORAL_TABLET | Freq: Every day | ORAL | Status: DC
  Administered 2014-06-12 – 2014-06-13 (×2): 500 mg via ORAL
  Filled 2014-06-12 (×2): qty 1

## 2014-06-12 MED ORDER — DILTIAZEM HCL ER COATED BEADS 180 MG PO CP24
180.0000 mg | ORAL_CAPSULE | Freq: Every day | ORAL | Status: DC
  Administered 2014-06-12 – 2014-06-13 (×2): 180 mg via ORAL
  Filled 2014-06-12 (×2): qty 1

## 2014-06-12 NOTE — Plan of Care (Signed)
Problem: Phase I Progression Outcomes Goal: O2 sats > or equal 90% or at baseline Outcome: Completed/Met Date Met:  06/12/14 ON 3L O2 PER N/C TOL. HOME DOSE Goal: Dyspnea controlled at rest Outcome: Completed/Met Date Met:  06/12/14 Goal: Hemodynamically stable Outcome: Completed/Met Date Met:  06/12/14 Goal: Pain controlled Outcome: Completed/Met Date Met:  06/12/14 ON PAIN MED REGIMEN AT HOME FOR CHRONIC  PAIN Goal: Discharge plan established Outcome: Completed/Met Date Met:  06/12/14 Goal: Patient Should Be Able To Teach Back Prior D/C (Patient should be able to teach back all education and instruction provided prior to D/C including: 1. Maintenance medication administration and usage. 2. Breakthrough medication usage. 3. Importance of home exercise. 4. COPD Action Plan. 5. Willingness to quit smoking annd steps to quit smoking as needed)  Outcome: Completed/Met Date Met:  06/12/14

## 2014-06-12 NOTE — Progress Notes (Signed)
Pt HR SB in 50's with short runs in the upper 30's non sustained. No pauses noted, will hold 0400 Diltiazem and continue to monitor pt. All other vitals WNL.   Prescilla Sours, Therapist, sports

## 2014-06-12 NOTE — Progress Notes (Signed)
Patient Name: [name] Date of Encounter: 06/12/2014  Principal Problem:   Acute respiratory failure Active Problems:   Abnormal chest x-ray with multiple lung nodules   COPD exacerbation   COPD with acute exacerbation   Atrial tachycardia, multifocal   Primary Cardiologist: 80 yo male w/ severe O2 dep COPD, lung nodules was admitted 11/23 w/ COPD exacerbation and had MAT.  Some bradycardia to the mid 30s during sleep.  Patient Profile: Dr. Sallyanne Kuster  SUBJECTIVE: Breathing better today, no chest pain or palpitations.  Slept well.  OBJECTIVE Filed Vitals:   06/11/14 1426 06/11/14 2048 06/12/14 0500 06/12/14 0843  BP: 112/62 120/66 130/86   Pulse: 100 96 92   Temp: 98 F (36.7 C) 98.4 F (36.9 C) 98.1 F (36.7 C)   TempSrc: Oral Oral Oral   Resp: 18 18 18    Height:      Weight:   175 lb 8 oz (79.606 kg)   SpO2: 95% 95% 92% 94%    Intake/Output Summary (Last 24 hours) at 06/12/14 1020 Last data filed at 06/12/14 0952  Gross per 24 hour  Intake      3 ml  Output    200 ml  Net   -197 ml   Filed Weights   06/10/14 0500 06/11/14 0514 06/12/14 0500  Weight: 172 lb 8 oz (78.245 kg) 189 lb (85.73 kg) 175 lb 8 oz (79.606 kg)    PHYSICAL EXAM General: Well developed, well nourished, male in no acute distress. Head: Normocephalic, atraumatic.  Neck: Supple without bruits, JVD not elevated. Lungs:  Resp regular and unlabored, decreased BS bases w/ some rales. Heart: slightly irreg, S1, S2, no S3, S4, or murmur; no rub. Abdomen: Soft, non-tender, non-distended, BS + x 4.  Extremities: No clubbing, cyanosis, no edema.  Neuro: Alert and oriented X 3. Moves all extremities spontaneously. Psych: Normal affect.  LABS: CBC:  Recent Labs  06/09/14 1512 06/10/14 0443  WBC 11.5* 10.4  HGB 15.5 13.4  HCT 47.5 41.4  MCV 95.6 95.2  PLT 125* 242*   Basic Metabolic Panel:  Recent Labs  06/09/14 1512 06/10/14 0443  NA  --  144  K  --  4.8  CL  --  104    CO2  --  28  GLUCOSE  --  137*  BUN  --  32*  CREATININE 1.07 1.17  CALCIUM  --  8.7   TELE:  NSR,  No afib seen  Echo: 06/10/2014 Conclusions - Left ventricle: Not well visualized. The cavity size was normal. There was mild focal basal hypertrophy of the septum. Systolic function was mildly reduced. The estimated ejection fraction was in the range of 45% to 50%. This is a gross estimation. Wall motion was normal; there were no regional wall motion abnormalities. Doppler parameters are consistent with abnormal left ventricular relaxation (grade 1 diastolic dysfunction).  Radiology/Studies: Ct Angio Chest Pe W/cm &/or Wo Cm 06/10/2014   CLINICAL DATA:  Shortness of breath.  EXAM: CT ANGIOGRAPHY CHEST WITH CONTRAST  TECHNIQUE: Multidetector CT imaging of the chest was performed using the standard protocol during bolus administration of intravenous contrast. Multiplanar CT image reconstructions and MIPs were obtained to evaluate the vascular anatomy.  CONTRAST:  12mL OMNIPAQUE IOHEXOL 350 MG/ML SOLN  COMPARISON:  04/17/2014  FINDINGS: The apical lungs are excluded from view. No visible pulmonary arteries are present at this level, and a pulmonary embolus should not be excluded do to this limitation. Pulmonary angiography  is limited in the lower lobes, inferior middle lobe, and lingula secondary to extensive respiratory motion.  THORACIC INLET/BODY WALL:  No acute abnormality.  MEDIASTINUM:  Normal heart size. No pericardial effusion. Lipomatous hypertrophy of the interatrial septum. No aortic dissection. No evidence of pulmonary embolism, as permitted by motion at the bases. No adenopathy.  LUNG WINDOWS:  Severe emphysema, diffuse and panlobular. There is diffuse bronchial wall thickening, especially in the lower lungs where there is also scattered mucoid impaction. No is superimposed edema or pneumonia identified. Scarring and atelectasis again noted in the right middle lobe. Trace  layering right pleural effusion. No pneumothorax.  UPPER ABDOMEN:  No acute findings.  OSSEOUS:  Remote deformity of the right fifth rib.  Review of the MIP images confirms the above findings.  IMPRESSION: 1. No evidence of pulmonary embolism. Respiratory motion limits subsegmental evaluation at the bases. 2. Advanced emphysema. 3. Bronchitis with multi focal mucoid impaction and right middle lobe atelectasis. 4. Trace right pleural effusion.   Electronically Signed   By: Jorje Guild M.D.   On: 06/10/2014 01:29     Current Medications:  . budesonide (PULMICORT) nebulizer solution  0.25 mg Nebulization BID  . citalopram  20 mg Oral Daily  . diltiazem  180 mg Oral Daily  . enoxaparin (LOVENOX) injection  40 mg Subcutaneous Q24H  . guaiFENesin  600 mg Oral BID  . ipratropium  0.5 mg Nebulization TID  . levalbuterol  0.63 mg Nebulization TID  . levofloxacin  500 mg Oral Daily  . losartan  50 mg Oral Daily  . predniSONE  50 mg Oral Q breakfast  . senna  1 tablet Oral BID  . sodium chloride  3 mL Intravenous Q12H      ASSESSMENT AND PLAN: Principal Problem:   Acute respiratory failure Active Problems:   Abnormal chest x-ray with multiple lung nodules   COPD exacerbation   COPD with acute exacerbation   Atrial tachycardia, multifocal  Principal Problem:  Acute respiratory failure - per IM  Active Problems:  Abnormal chest x-ray with multiple lung nodules- per IM   COPD exacerbation- per IM   COPD with acute exacerbation- per IM   Atrial tachycardia, multifocal - Still has, echo w/ mild LVD, no WMA, dilt 60 mg tid started 11/23, increased to qid 11/24, HR is improved, continue to follow. Change to CD once dose established. Would normally switch to Cardizem 240 in this situation.  There was concern for bradycardia from internal medicine so we will switch to Cardizem CD 180 mg daily .  Increase to 240 mg daily if arrhythmia returns.   Will follow along.  VARANASI,JAYADEEP  S. 06/12/2014

## 2014-06-12 NOTE — Progress Notes (Signed)
[facility] PROGRESS NOTE  SHEA KAPUR EXB:[id number] DOB: Oct 11, 1933 DOA: 06/09/2014 PCP: Kandice Hams, MD   Brief narrative 80 year old male with history of asthma, COPD on 3 L O2 via nasal cannula at home, hypertension, pulmonary nodules (following with surgery as outpatient) presented with progressive shortness of breath for past 2 months. Also had progressive weakness for the past 3 days. Patient used his nebulizers and inhalers at home without much relief. Patient admitted for COPD exacerbation. Patient was also found to have asymptomatic multifocal atrial tachycardia in the ED. Admitted to telemetry.  Assessment/Plan: Acute on chronic hypoxic respiratory failure secondary to COPD exacerbation Continue oral prednisone, nebulizers and Levaquin. Continue incentive spirometry/flutter involved. Continue O2 via nasal cannula. Maintaining O2 sat -CT angiogram chest negative for PE -2-D echo shows mild systolic CHF with EF of 10-27% and grade 1 diastolic dysfunction. No wall motion abnormality. -Patient is on Advair 250-50 twice daily and DuoNeb at home. New Appointment established with pulmonary Dr. Melvyn Novas for next week.  Multifocal atrial tachycardia Started on Cardizem 60 mg 4 times a day. Heart rate dropped to the 40s while asleep overnight. Switched to long-acting Cardizem 180 mg daily and monitor. 2-D echo shows mild systolic dysfunction. Appreciate cardiology follow-up.   Mild systolic CHF Appears euvolemic. Continue losartan.  Generalized weakness and protein calorie malnutrition Seen by physical therapy and recommended home health PT. Seen by dietitian and added ensure supplements   DVT prophylaxis Subcutaneous Lovenox  Diet: Heart healthy   Code Status: Full code Family Communication: None at bedside Disposition Plan: Home likely tomorrow   Consultants:  Cardiology  Procedures:  2D echo  Antibiotics:  Levaquin  HPI/Subjective: Patient seen and  examined. Reports his breathing to be much better. Noted for drop in his heart rate to the 40s on the monitor overnight while he was asleep  Objective: Filed Vitals:   06/12/14 1400  BP: 103/55  Pulse: 43  Temp: 98.4 F (36.9 C)  Resp: 18    Intake/Output Summary (Last 24 hours) at 06/12/14 1444 Last data filed at 06/12/14 0952  Gross per 24 hour  Intake      3 ml  Output    200 ml  Net   -197 ml   Filed Weights   06/10/14 0500 06/11/14 0514 06/12/14 0500  Weight: 78.245 kg (172 lb 8 oz) 85.73 kg (189 lb) 79.606 kg (175 lb 8 oz)    Exam:   General: Elderly thin built male in no acute distress  HEENT: No pallor, moist oral mucosa  Chest: Equal air entry bilaterally, no rhonchi, wheeze or crackles  CVS: Normal S1 and S2, no murmurs   Abdomen: Soft, nondistended, nontender,   Extremity: Warm, no edema  CNS: Alert and oriented  Data Reviewed: Basic Metabolic Panel:  Recent Labs Lab 06/09/14 0700 06/09/14 1512 06/10/14 0443  NA 144  --  144  K 4.3  --  4.8  CL 105  --  104  CO2 27  --  28  GLUCOSE 89  --  137*  BUN 29*  --  32*  CREATININE 1.04 1.07 1.17  CALCIUM 8.9  --  8.7   Liver Function Tests: No results for input(s): AST, ALT, ALKPHOS, BILITOT, PROT, ALBUMIN in the last 168 hours. No results for input(s): LIPASE, AMYLASE in the last 168 hours. No results for input(s): AMMONIA in the last 168 hours. CBC:  Recent Labs Lab 06/09/14 0700 06/09/14 1512 06/10/14 0443  WBC 10.1 11.5* 10.4  NEUTROABS  8.0*  --   --   HGB 14.4 15.5 13.4  HCT 44.9 47.5 41.4  MCV 94.1 95.6 95.2  PLT 124* 125* 123*   Cardiac Enzymes: No results for input(s): CKTOTAL, CKMB, CKMBINDEX, TROPONINI in the last 168 hours. BNP (last 3 results)  Recent Labs  05/12/14 1525  PROBNP 310.2   CBG: No results for input(s): GLUCAP in the last 168 hours.  No results found for this or any previous visit (from the past 240 hour(s)).   Studies: No results  found.  Scheduled Meds: . budesonide (PULMICORT) nebulizer solution  0.25 mg Nebulization BID  . citalopram  20 mg Oral Daily  . diltiazem  180 mg Oral Daily  . enoxaparin (LOVENOX) injection  40 mg Subcutaneous Q24H  . guaiFENesin  600 mg Oral BID  . ipratropium  0.5 mg Nebulization TID  . levalbuterol  0.63 mg Nebulization TID  . levofloxacin  500 mg Oral Daily  . losartan  50 mg Oral Daily  . predniSONE  50 mg Oral Q breakfast  . senna  1 tablet Oral BID  . sodium chloride  3 mL Intravenous Q12H   Continuous Infusions:     Time spent: 25 minutes    Ravin Denardo  [facility] Pager [phone] If 7PM-7AM, please contact night-coverage at [email or web address], password [facility] 06/12/2014, 2:44 PM  LOS: 3 days

## 2014-06-13 DIAGNOSIS — J9611 Chronic respiratory failure with hypoxia: Secondary | ICD-10-CM | POA: Diagnosis present

## 2014-06-13 DIAGNOSIS — E46 Unspecified protein-calorie malnutrition: Secondary | ICD-10-CM

## 2014-06-13 DIAGNOSIS — I471 Supraventricular tachycardia: Secondary | ICD-10-CM | POA: Diagnosis present

## 2014-06-13 DIAGNOSIS — I5020 Unspecified systolic (congestive) heart failure: Secondary | ICD-10-CM | POA: Diagnosis present

## 2014-06-13 DIAGNOSIS — J9621 Acute and chronic respiratory failure with hypoxia: Secondary | ICD-10-CM

## 2014-06-13 MED ORDER — FLUTICASONE-SALMETEROL 250-50 MCG/DOSE IN AEPB: 1.0000 | INHALATION_SPRAY | Freq: Two times a day (BID) | RESPIRATORY_TRACT | Status: DC

## 2014-06-13 MED ORDER — PREDNISONE 20 MG PO TABS: ORAL_TABLET | ORAL | Status: AC

## 2014-06-13 MED ORDER — GUAIFENESIN ER 600 MG PO TB12: 600.0000 mg | ORAL_TABLET | Freq: Two times a day (BID) | ORAL | Status: DC

## 2014-06-13 MED ORDER — ALBUTEROL SULFATE HFA 108 (90 BASE) MCG/ACT IN AERS: 2.0000 | INHALATION_SPRAY | Freq: Four times a day (QID) | RESPIRATORY_TRACT | Status: DC | PRN

## 2014-06-13 MED ORDER — LOSARTAN POTASSIUM 50 MG PO TABS: 50.0000 mg | ORAL_TABLET | Freq: Every day | ORAL | Status: DC

## 2014-06-13 MED ORDER — TIOTROPIUM BROMIDE MONOHYDRATE 18 MCG IN CAPS: 18.0000 ug | ORAL_CAPSULE | Freq: Every day | RESPIRATORY_TRACT | Status: DC

## 2014-06-13 MED ORDER — DILTIAZEM HCL ER COATED BEADS 180 MG PO CP24: 180.0000 mg | ORAL_CAPSULE | Freq: Every day | ORAL | Status: DC

## 2014-06-13 MED ORDER — IPRATROPIUM-ALBUTEROL 0.5-2.5 (3) MG/3ML IN SOLN: 3.0000 mL | Freq: Four times a day (QID) | RESPIRATORY_TRACT | Status: DC | PRN

## 2014-06-13 NOTE — Progress Notes (Signed)
Talked to patient about [place] choices, patient requested [facility] for [facility] needs; Butch Penny with [facility] called for arrangements; Aneta Mins [phone]

## 2014-06-13 NOTE — Progress Notes (Signed)
PT Cancellation Note  Patient Details Name: [name] MRN: [id number] DOB: 1933-11-22   Cancelled Treatment:    Reason Eval/Treat Not Completed: Patient declined, no reason specified. Patient declined therapy at this time. Patient is planning to DC home today.    Jacqualyn Posey 06/13/2014, 12:07 PM

## 2014-06-13 NOTE — Discharge Instructions (Signed)
Chronic Obstructive Pulmonary Disease Chronic obstructive pulmonary disease (COPD) is a common lung problem. In COPD, the flow of air from the lungs is limited. The way your lungs work will probably never return to normal, but there are things you can do to improve your lungs and make yourself feel better. HOME CARE  Take all medicines as told by your doctor.  Avoid medicines or cough syrups that dry up your airway (such as antihistamines) and do not allow you to get rid of thick spit. You do not need to avoid them if told differently by your doctor.  If you smoke, stop. Smoking makes the problem worse.  Avoid being around things that make your breathing worse (like smoke, chemicals, and fumes).  Use oxygen therapy and therapy to help improve your lungs (pulmonary rehabilitation) if told by your doctor. If you need home oxygen therapy, ask your doctor if you should buy a tool to measure your oxygen level (oximeter).  Avoid people who have a sickness you can catch (contagious).  Avoid going outside when it is very hot, cold, or humid.  Eat healthy foods. Eat smaller meals more often. Rest before meals.  Stay active, but remember to also rest.  Make sure to get all the shots (vaccines) your doctor recommends. Ask your doctor if you need a pneumonia shot.  Learn and use tips on how to relax.  Learn and use tips on how to control your breathing as told by your doctor. Try:  Breathing in (inhaling) through your nose for 1 second. Then, pucker your lips and breath out (exhale) through your lips for 2 seconds.  Putting one hand on your belly (abdomen). Breathe in slowly through your nose for 1 second. Your hand on your belly should move out. Pucker your lips and breathe out slowly through your lips. Your hand on your belly should move in as you breathe out.  Learn and use controlled coughing to clear thick spit from your lungs. The steps are: 1. Lean your head a little forward. 2. Breathe  in deeply. 3. Try to hold your breath for 3 seconds. 4. Keep your mouth slightly open while coughing 2 times. 5. Spit any thick spit out into a tissue. 6. Rest and do the steps again 1 or 2 times as needed. GET HELP IF:  You cough up more thick spit than usual.  There is a change in the color or thickness of the spit.  It is harder to breathe than usual.  Your breathing is faster than usual. GET HELP RIGHT AWAY IF:   You have shortness of breath while resting.  You have shortness of breath that stops you from:  Being able to talk.  Doing normal activities.  You chest hurts for longer than 5 minutes.  Your skin color is more blue than usual.  Your pulse oximeter shows that you have low oxygen for longer than 5 minutes. MAKE SURE YOU:   Understand these instructions.  Will watch your condition.  Will get help right away if you are not doing well or get worse. Document Released: 12/21/2007 Document Revised: 11/18/2013 Document Reviewed: 02/28/2013 Exit[employer] Patient Information 2015 [employer]. This information is not intended to replace advice given to you by your health care provider. Make sure you discuss any questions you have with your health care provider.  

## 2014-06-13 NOTE — Plan of Care (Signed)
Problem: Discharge Progression Outcomes Goal: Dyspnea controlled Outcome: Completed/Met Date Met:  06/13/14 Goal: O2 sats > or equal 90% or at baseline Outcome: Completed/Met Date Met:  06/13/14 Goal: Home O2 if indicated Outcome: Completed/Met Date Met:  06/13/14 Goal: Able to self administer respiratory meds Outcome: Completed/Met Date Met:  06/13/14 Goal: Flu vaccine received if indicated Outcome: Completed/Met Date Met:  06/13/14 Goal: Pneumonia vaccine received if indicated Outcome: Completed/Met Date Met:  06/13/14 Goal: Independent ADLs or Home Health Care Outcome: Completed/Met Date Met:  06/13/14 Goal: Barriers To Progression Addressed/Resolved Outcome: Completed/Met Date Met:  06/13/14 Goal: Discharge plan in place and appropriate Outcome: Completed/Met Date Met:  06/13/14 Goal: Pain controlled with appropriate interventions Outcome: Completed/Met Date Met:  06/13/14 Goal: Tolerating diet Outcome: Completed/Met Date Met:  06/13/14 Goal: Activity appropriate for discharge plan Outcome: Completed/Met Date Met:  06/13/14 Goal: Hemodynamically stable Outcome: Completed/Met Date Met:  73/40/37 Goal: Complications resolved/controlled Outcome: Completed/Met Date Met:  06/13/14 Goal: Other Discharge Outcomes/Goals Outcome: Completed/Met Date Met:  06/13/14

## 2014-06-13 NOTE — Discharge Summary (Addendum)
Physician Discharge Summary  [name] QVZ:[id number] DOB: 1934-06-02 DOA: 06/09/2014  PCP: Kandice Hams, MD  Admit date: 06/09/2014 Discharge date: 06/13/2014  Time spent: 35 minutes  Recommendations for Outpatient Follow-up:  1. Discharge home with HHPT    Discharge Diagnoses:  Principal Problem:   Acute on chronic respiratory failure with hypoxia   Active Problems:   COPD with acute exacerbation    Multifocal atrial tachycardia   Abnormal chest x-ray with multiple lung nodules   Advanced COPD   Protein-calorie malnutrition   Systolic CHF with reduced left ventricular function, NYHA class 2  Multiple left lung nodules   Discharge Condition: FAIR  Diet recommendation: heart healthy     Code Status: Full code  Family Communication: None at bedside    Filed Weights   06/11/14 0514 06/12/14 0500 06/13/14 0646  Weight: 85.73 kg (189 lb) 79.606 kg (175 lb 8 oz) 80.967 kg (178 lb 8 oz)    History of present illness:  80 year old male with history of asthma, COPD on 3 L O2 via nasal cannula at home, hypertension, pulmonary nodules (following with surgery as outpatient) presented with progressive shortness of breath for past 2 months. Also had progressive weakness for the past 3 days. Patient used his nebulizers and inhalers at home without much relief. Patient admitted for COPD exacerbation. Patient was also found to have asymptomatic multifocal atrial tachycardia in the ED. Admitted to telemetry.  Hospital Course:  Acute on chronic hypoxic respiratory failure secondary to COPD exacerbation Placed on IV solumedrol, transition to oral prednisone, scheduled  nebulizers and empiric Levaquin. Continued incentive spirometry/flutter involved. Maintaining o2 sat on 3L ( home dose) -CT angiogram chest negative for PE on admission -2-D echo shows mild systolic CHF with EF of 32-95% and grade 1 diastolic dysfunction. No wall motion abnormality. -Patient is on Advair  250-50 twice daily and DuoNeb at home. New Appointment established with pulmonary Dr. Melvyn Novas for next week. patient clinically improved. Will discharge on oral prednisone taper over next 12 days. Will prescribe albuterol prn inhaler and  Daily  Spiriva. Continue prn duoneb and bid advair. Encouraged to use oxygen continuously.   Multifocal atrial tachycardia Started on Cardizem 60 mg 4 times a day.  Switched to long-acting Cardizem 180 mg daily. HR stable on this dose and will be discharged on it. 2-D echo shows mild systolic dysfunction. Appreciate cardiology recommendation.  Mild systolic CHF See on 2D echo with EF of 45%. Appears euvolemic. Continue losartan.  Generalized weakness and protein calorie malnutrition Seen by physical therapy and recommended home health PT. Seen by dietitian and added ensure supplements  Multiple left lung nodules.  follows with Dr Servando Snare. Given severe COPD, recommendations are for monitoring with follow up CT chest.     Consultants:  Cardiology  Procedures:  2D echo  Antibiotics:  Levaquin completed 5 day course on 11/27   Discharge Exam: Filed Vitals:   06/13/14 0646  BP: 118/67  Pulse: 55  Temp: 97.8 F (36.6 C)  Resp:     General: Elderly thin built male in no acute distress  HEENT:  moist oral mucosa  Chest: Equal air entry bilaterally, no rhonchi, wheeze or crackles  CVS: Normal S1 and S2, no murmurs   Abdomen: Soft, nondistended, nontender, BS+  Extremity: Warm, no edema  CNS: Alert and oriented   Discharge Instructions You were cared for by a hospitalist during your hospital stay. If you have any questions about your discharge medications or the care you  received while you were in the hospital after you are discharged, you can call the unit and asked to speak with the hospitalist on call if the hospitalist that took care of you is not available. Once you are discharged, your primary care physician will handle any  further medical issues. Please note that NO REFILLS for any discharge medications will be authorized once you are discharged, as it is imperative that you return to your primary care physician (or establish a relationship with a primary care physician if you do not have one) for your aftercare needs so that they can reassess your need for medications and monitor your lab values.   Current Discharge Medication List    START taking these medications   Details  albuterol (PROVENTIL HFA;VENTOLIN HFA) 108 (90 BASE) MCG/ACT inhaler Inhale 2 puffs into the lungs every 6 (six) hours as needed for wheezing or shortness of breath. Qty: 1 Inhaler, Refills: 5    diltiazem (CARDIZEM CD) 180 MG 24 hr capsule Take 1 capsule (180 mg total) by mouth daily. Qty: 60 capsule, Refills: 0    Fluticasone-Salmeterol (ADVAIR DISKUS) 250-50 MCG/DOSE AEPB Inhale 1 puff into the lungs 2 (two) times daily. Qty: 5 each, Refills: 5    guaiFENesin (MUCINEX) 600 MG 12 hr tablet Take 1 tablet (600 mg total) by mouth 2 (two) times daily. Qty: 10 tablet, Refills: 0    ipratropium-albuterol (DUONEB) 0.5-2.5 (3) MG/3ML SOLN Take 3 mLs by nebulization every 6 (six) hours as needed (increased / severe wheezing , perisstent shortness of breath). Qty: 360 mL, Refills: 0    tiotropium (SPIRIVA HANDIHALER) 18 MCG inhalation capsule Place 1 capsule (18 mcg total) into inhaler and inhale daily. Qty: 3 capsule, Refills: 5      CONTINUE these medications which have CHANGED   Details  predniSONE (DELTASONE) 20 MG tablet 2 tabs po daily x 3 days, then 1.5 tabs x 3 days, then 1 tabs x 3 days, then 0.5 tab x 3 Qty: 15 tablet, Refills: 0      CONTINUE these medications which have NOT CHANGED   Details  ALPRAZolam (XANAX) 0.5 MG tablet Take 1 tablet (0.5 mg total) by mouth at bedtime as needed for anxiety. Qty: 30 tablet, Refills: 0    chlorpheniramine-HYDROcodone (TUSSIONEX PENNKINETIC ER) 10-8 MG/5ML LQCR Take 5 mLs by mouth every  12 (twelve) hours as needed for cough. Qty: 115 mL, Refills: 0    citalopram (CELEXA) 20 MG tablet Take 20 mg by mouth daily.   Losartan 50 mg tablet                               Take 1 tablet po daily    STOP taking these medications     doxycycline (VIBRAMYCIN) 100 MG capsule        No Known Allergies Follow-up Information    Follow up with Christinia Gully, MD On 06/20/2014.   Specialty:  Pulmonary Disease   Why:  appt at 9:15 am   Contact information:   [address] [place] [address] [phone]       Follow up with Kandice Hams, MD In 1 week.   Specialty:  Internal Medicine   Contact information:   [address]  [place] [address] [phone]        The results of significant diagnostics from this hospitalization (including imaging, microbiology, ancillary and laboratory) are listed below for reference.  Significant Diagnostic Studies: Dg Chest 2 View  06/09/2014   CLINICAL DATA:  Weakness shortness of breath with history of oxygen dependent COPD  EXAM: CHEST  2 VIEW  COMPARISON:  PA and lateral chest dated May 12, 2014  FINDINGS: There is marked hyperinflation. The pulmonary interstitial markings are chronically increased but are slightly more conspicuous today. There is no alveolar infiltrate, pneumothorax, nor pleural effusion. The heart and pulmonary vascularity are within the limits of normal. There is chronic deformity of the right fifth rib.  IMPRESSION: Severe COPD. Slightly increased pulmonary interstitial markings may reflect subsegmental atelectasis or interstitial pneumonia.   Electronically Signed   By: David  Martinique   On: 06/09/2014 07:57   Ct Angio Chest Pe W/cm &/or Wo Cm  06/10/2014   CLINICAL DATA:  Shortness of breath.  EXAM: CT ANGIOGRAPHY CHEST WITH CONTRAST  TECHNIQUE: Multidetector CT imaging of the chest was performed using the standard protocol during bolus administration of intravenous contrast. Multiplanar  CT image reconstructions and MIPs were obtained to evaluate the vascular anatomy.  CONTRAST:  158mL OMNIPAQUE IOHEXOL 350 MG/ML SOLN  COMPARISON:  04/17/2014  FINDINGS: The apical lungs are excluded from view. No visible pulmonary arteries are present at this level, and a pulmonary embolus should not be excluded do to this limitation. Pulmonary angiography is limited in the lower lobes, inferior middle lobe, and lingula secondary to extensive respiratory motion.  THORACIC INLET/BODY WALL:  No acute abnormality.  MEDIASTINUM:  Normal heart size. No pericardial effusion. Lipomatous hypertrophy of the interatrial septum. No aortic dissection. No evidence of pulmonary embolism, as permitted by motion at the bases. No adenopathy.  LUNG WINDOWS:  Severe emphysema, diffuse and panlobular. There is diffuse bronchial wall thickening, especially in the lower lungs where there is also scattered mucoid impaction. No is superimposed edema or pneumonia identified. Scarring and atelectasis again noted in the right middle lobe. Trace layering right pleural effusion. No pneumothorax.  UPPER ABDOMEN:  No acute findings.  OSSEOUS:  Remote deformity of the right fifth rib.  Review of the MIP images confirms the above findings.  IMPRESSION: 1. No evidence of pulmonary embolism. Respiratory motion limits subsegmental evaluation at the bases. 2. Advanced emphysema. 3. Bronchitis with multi focal mucoid impaction and right middle lobe atelectasis. 4. Trace right pleural effusion.   Electronically Signed   By: Jorje Guild M.D.   On: 06/10/2014 01:29    Microbiology: No results found for this or any previous visit (from the past 240 hour(s)).   Labs: Basic Metabolic Panel:  Recent Labs Lab 06/09/14 0700 06/09/14 1512 06/10/14 0443  NA 144  --  144  K 4.3  --  4.8  CL 105  --  104  CO2 27  --  28  GLUCOSE 89  --  137*  BUN 29*  --  32*  CREATININE 1.04 1.07 1.17  CALCIUM 8.9  --  8.7   Liver Function Tests: No  results for input(s): AST, ALT, ALKPHOS, BILITOT, PROT, ALBUMIN in the last 168 hours. No results for input(s): LIPASE, AMYLASE in the last 168 hours. No results for input(s): AMMONIA in the last 168 hours. CBC:  Recent Labs Lab 06/09/14 0700 06/09/14 1512 06/10/14 0443  WBC 10.1 11.5* 10.4  NEUTROABS 8.0*  --   --   HGB 14.4 15.5 13.4  HCT 44.9 47.5 41.4  MCV 94.1 95.6 95.2  PLT 124* 125* 123*   Cardiac Enzymes: No results for input(s): CKTOTAL, CKMB, CKMBINDEX, TROPONINI in  the last 168 hours. BNP: BNP (last 3 results)  Recent Labs  05/12/14 1525  PROBNP 310.2   CBG: No results for input(s): GLUCAP in the last 168 hours.     SignedLouellen Molder  [facility] 06/13/2014, 9:00 AM

## 2014-06-13 NOTE — Progress Notes (Signed)
Patient has private insurance with [employer] Medicare Member:     [name], [name] [[id number]]     [name] [[id number]]    Component Group Net Aut Ben Ref Clsfr Level MOOP/Ded Patient Portion Limit Left Benefit Bucket   Non Covered (vt) N/A N/A N/A   Not Covered      CHL SELF-PAY PROCEDURES N/A N/A N/A   Not Covered      ED  N/A N/A N/A   0.00 copay      URGENT CARE (SPEC/VT) N/A N/A N/A   0.00 copay      COUMADIN COPAY (VT) N/A N/A N/A   0.00 copay      Primary Care Visits N/A N/A N/A   0.00 copay      Specialty Visits (Spec/vt N/A N/A N/A   0.00 copay      All Procedures N/A N/A N/A   No Payment      NO COPAY VISIT TYPES N/A N/A N/A   No Payment

## 2014-06-16 ENCOUNTER — Telehealth: Payer: Self-pay | Admitting: Cardiovascular Disease

## 2014-06-18 NOTE — Telephone Encounter (Signed)
Closed encounter °

## 2014-06-20 ENCOUNTER — Institutional Professional Consult (permissible substitution): Payer: Commercial Managed Care - HMO | Admitting: Internal Medicine

## 2014-06-25 ENCOUNTER — Encounter: Payer: Self-pay | Admitting: Internal Medicine

## 2014-06-25 ENCOUNTER — Ambulatory Visit: Payer: Commercial Managed Care - HMO | Admitting: Cardiovascular Disease

## 2014-06-25 ENCOUNTER — Ambulatory Visit (INDEPENDENT_AMBULATORY_CARE_PROVIDER_SITE_OTHER): Payer: Commercial Managed Care - HMO | Admitting: Internal Medicine

## 2014-06-25 VITALS — BP 114/70 | HR 91 | Ht 71.0 in | Wt 177.8 lb

## 2014-06-25 DIAGNOSIS — J449 Chronic obstructive pulmonary disease, unspecified: Secondary | ICD-10-CM

## 2014-06-25 DIAGNOSIS — J9611 Chronic respiratory failure with hypoxia: Secondary | ICD-10-CM

## 2014-06-25 DIAGNOSIS — J9621 Acute and chronic respiratory failure with hypoxia: Secondary | ICD-10-CM

## 2014-06-25 NOTE — Progress Notes (Signed)
Subjective:     Patient ID: [name], male   DOB: September 27, 1933   MRN: [id number]  HPI 29 yowf quit smoking 2004 baseline 02 x 2007 with baseline 50 ft @ 3lpm s/p admit  Admit date: 06/09/2014 Discharge date: 06/13/2014 Recommendations for Outpatient Follow-up:  1. Discharge home with HHPT Discharge Diagnoses:  Principal Problem:  Acute on chronic respiratory failure with hypoxia Active Problems:  COPD with acute exacerbation  Multifocal atrial tachycardia  Abnormal chest x-ray with multiple lung nodules  Advanced COPD  Protein-calorie malnutrition  Systolic CHF with reduced left ventricular function, NYHA class 2 Multiple left lung nodules   Filed Weights   06/11/14 0514 06/12/14 0500 06/13/14 0646  Weight: 85.73 kg (189 lb) 79.606 kg (175 lb 8 oz) 80.967 kg (178 lb 8 oz)    History of present illness:  80 year old male with history of asthma, COPD on 3 L O2 via nasal cannula at home, hypertension, pulmonary nodules (following with surgery as outpatient) presented with progressive shortness of breath for past 2 months. Also had progressive weakness for the past 3 days. Patient used his nebulizers and inhalers at home without much relief. Patient admitted for COPD exacerbation. Patient was also found to have asymptomatic multifocal atrial tachycardia in the ED. Admitted to telemetry.  Hospital Course:  Acute on chronic hypoxic respiratory failure secondary to COPD exacerbation Placed on IV solumedrol, transition to oral prednisone, scheduled nebulizers and empiric Levaquin completed 5 day course on 11/27 Continued incentive spirometry/flutter involved. Maintaining o2 sat on 3L ( home dose) -CT angiogram chest negative for PE on admission -2-D echo shows mild systolic CHF with EF of 70-26% and grade 1 diastolic dysfunction. No wall motion abnormality. -Patient is on Advair 250-50 twice daily and DuoNeb at home. New Appointment established with pulmonary  Dr. Melvyn Novas for next week. patient clinically improved. Will discharge on oral prednisone taper over next 12 days. Will prescribe albuterol prn inhaler and Daily Spiriva. Continue prn duoneb and bid advair. Encouraged to use oxygen continuously.   Multifocal atrial tachycardia Started on Cardizem 60 mg 4 times a day. Switched to long-acting Cardizem 180 mg daily. HR stable on this dose and will be discharged on it. 2-D echo shows mild systolic dysfunction. Appreciate cardiology recommendation.  Mild systolic CHF See on 2D echo with EF of 45%. Appears euvolemic. Continue losartan.  Generalized weakness and protein calorie malnutrition Seen by physical therapy and recommended home health PT. Seen by dietitian and added ensure supplements  Multiple left lung nodules. follows with Dr Servando Snare. Given severe COPD, recommendations are for monitoring with follow up CT chest.     06/25/2014 Initial Constultation/Magdelena Kinsella re: severe copd/ chronic 02 dep Chief Complaint  Patient presents with  . HFU    Pt states that his breathing has improved back to his normal baseline since hospital d/c.    3lpm at home / 5lpm walking 50 ft at most s cough/ congestion  No obvious day to day or daytime variabilty or assoc excess or purulent sputum  or cp or chest tightness, subjective wheeze overt sinus or hb symptoms. No unusual exp hx or h/o childhood pna/ asthma or knowledge of premature birth.  Sleeping ok without nocturnal  or early am exacerbation  of respiratory  c/o's or need for noct saba. Also denies any obvious fluctuation of symptoms with weather or environmental changes or other aggravating or alleviating factors except as outlined above   Current Medications, Allergies, Complete Past Medical History, Past Surgical History,  Family History, and Social History were reviewed in [employer] record.  ROS  The following are not active complaints unless bolded sore throat, dysphagia,  dental problems, itching, sneezing,  nasal congestion or excess/ purulent secretions, ear ache,   fever, chills, sweats, unintended wt loss, pleuritic or exertional cp, hemoptysis,  orthopnea pnd or leg swelling, presyncope, palpitations, heartburn, abdominal pain, anorexia, nausea, vomiting, diarrhea  or change in bowel or urinary habits, change in stools or urine, dysuria,hematuria,  rash, arthralgias, visual complaints, headache, numbness weakness or ataxia or problems with walking or coordination,  change in mood/affect or memory.       Review of Systems     Objective:   Physical Exam   W/c disshevled wm nad/ w/c bound   Wt Readings from Last 3 Encounters:  06/25/14 177 lb 12.8 oz (80.65 kg)  06/13/14 178 lb 8 oz (80.967 kg)  04/17/14 178 lb (80.74 kg)    Vital signs reviewed   HEENT mild turbinate edema.  Full dentures Oropharynx no thrush or excess pnd or cobblestoning.  No JVD or cervical adenopathy. Mild accessory muscle hypertrophy. Trachea midline, nl thryroid. Chest was hyperinflated by percussion with diminished breath sounds and moderate increased exp time without wheeze. Hoover sign positive at mid inspiration. Regular rate and rhythm without murmur gallop or rub or increase P2 or edema.  Abd: no hsm, nl excursion. Ext warm without cyanosis or clubbing.   05/21/14 CTa chest 1. No evidence of pulmonary embolism. Respiratory motion limits subsegmental evaluation at the bases. 2. Advanced emphysema. 3. Bronchitis with multi focal mucoid impaction and right middle lobe atelectasis. 4. Trace right pleural effusion    Assessment:

## 2014-06-25 NOTE — Patient Instructions (Addendum)
Ok to leave off spiriva for now   Plan A = automatic = advair 250 /50 one twice daily   Plan B= backup  Only use your albuterol (proair) as a rescue medication to be used if you can't catch your breath by resting or doing a relaxed purse lip breathing pattern.  - The less you use it, the better it will work when you need it. - Ok to use up to 2 puffs  every 4 hours if you must but call for immediate appointment if use goes up over your usual need - Don't leave home without it !!  (think of it like the spare tire for your car)   Plan C = nebulizer, only use if plan B fails, ok to use nebulizer up to every 4 hours if needed   Plan D = Doctor, call me  Plan E = ER, go there if all else fails  Please schedule a follow up office visit in 4 weeks, sooner if needed

## 2014-06-26 ENCOUNTER — Telehealth: Payer: Self-pay | Admitting: Internal Medicine

## 2014-06-26 DIAGNOSIS — J9611 Chronic respiratory failure with hypoxia: Secondary | ICD-10-CM

## 2014-06-26 DIAGNOSIS — J449 Chronic obstructive pulmonary disease, unspecified: Secondary | ICD-10-CM

## 2014-06-26 NOTE — Telephone Encounter (Signed)
Called spoke with [facility]. Reports pt neb machine is not working properly and wants order sent to [facility]. Order placed. Nothing further needed

## 2014-06-27 ENCOUNTER — Telehealth: Payer: Self-pay | Admitting: Internal Medicine

## 2014-06-27 ENCOUNTER — Telehealth: Payer: Self-pay | Admitting: *Deleted

## 2014-06-27 DIAGNOSIS — J449 Chronic obstructive pulmonary disease, unspecified: Secondary | ICD-10-CM

## 2014-06-27 NOTE — Telephone Encounter (Signed)
Per MW- pt has already had PFT's so does not need to have PFT done on 07/29/13 at 1 pm  He can just keep his 2 pm appt that day  LMTCB for the pt

## 2014-06-27 NOTE — Telephone Encounter (Signed)
-----   Message from Tanda Rockers, MD sent at 06/27/2014  5:41 AM EST ----- Cancel f/u pfts

## 2014-06-27 NOTE — Assessment & Plan Note (Signed)
-    06/25/2014   Walked 5lpm  x  50 ft slow / unsteady gait, stopped due to do to sob and sats 83%  Will need to have PT continue to monitor him at home over the next several weeks so we don't push him too low or provide him with higher flow/ oximyzer if needed   Will ask DME to do titration and provide him with whatever he needs to maintain closer to 90% with activity

## 2014-06-27 NOTE — Telephone Encounter (Signed)
Will forward to [facility] for order to be faxed to another DME who will accept ins plan  Thanks!

## 2014-06-27 NOTE — Assessment & Plan Note (Addendum)
PFTs 03/18/14  FEV1  0.77 (23%) ratio 38 and DLCO 16%  DDX of  difficult airways management all start with A and  include Adherence, Ace Inhibitors, Acid Reflux, Active Sinus Disease, Alpha 1 Antitripsin deficiency, Anxiety masquerading as Airways dz,  ABPA,  allergy(esp in young), Aspiration (esp in elderly), Adverse effects of DPI,  Active smokers, plus two Bs  = Bronchiectasis and Beta blocker use..and one C= CHF  Adherence is always the initial "prime suspect" and is a multilayered concern that requires a "trust but verify" approach in every patient - starting with knowing how to use medications, especially inhalers, correctly, keeping up with refills and understanding the fundamental difference between maintenance and prns vs those medications only taken for a very short course and then stopped and not refilled.  The proper method of use, as well as anticipated side effects, of a metered-dose inhaler are discussed and demonstrated to the patient. Improved effectiveness after extensive coaching during this visit to a level of approximately  75%   ? Active smoking > denies  ? Adverse effect of dpi's > he's convinced the spiriva isn't helping so ? Whether better on respimat but prefers for now to leave it off altogether and that's fine with me as long as he understands there is a delay (like pumping or not pumping high octance into your tank is what I told him)  ? chf  Lab Results  Component Value Date   PROBNP 310.2 05/12/2014   being followed by cards   See instructions for specific recommendations which were reviewed directly with the patient who was given a copy with highlighter outlining the key components.

## 2014-06-30 NOTE — Telephone Encounter (Signed)
Order faxed to in network DME [facility]). Called and left message on pt's phone that order has been faxed to [facility] to contact him to arrange amb titration and adjust pt to > 90%. Advised patient that his DME is now [place] and if he has any questions, to contact me on [phone]. Rhonda J Cobb

## 2014-06-30 NOTE — Telephone Encounter (Signed)
Spoke with the pt and cancelled pft  Nothing further needed

## 2014-07-01 ENCOUNTER — Other Ambulatory Visit: Payer: Self-pay | Admitting: Internal Medicine

## 2014-07-01 DIAGNOSIS — J449 Chronic obstructive pulmonary disease, unspecified: Secondary | ICD-10-CM

## 2014-07-01 NOTE — Telephone Encounter (Signed)
I need an order for [facility] to provide o2 at the liter flow via [place] indicate gas or liquid o2 and an order for [facility] to D/C pt's home o2, once [facility] gets their oxygen set up in pt's home. Rhonda J Cobb

## 2014-07-01 NOTE — Telephone Encounter (Signed)
Spoke with [place] at [place] and she states they cannot do the titration because they do not have the order for pt's oxygen.  Apparently pt still has [facility] equipment.  Rhonda, please advise what needs to be done

## 2014-07-01 NOTE — Telephone Encounter (Signed)
[facility] calling back about the pt's oxygen but they said his oxygen is with advanced but his ins is with [employer] [phone]

## 2014-07-01 NOTE — Telephone Encounter (Signed)
Order has been placed.

## 2014-07-02 ENCOUNTER — Telehealth: Payer: Self-pay | Admitting: Internal Medicine

## 2014-07-02 NOTE — Telephone Encounter (Signed)
Katleynn from Dr. Lina Sar office called back stating that Dr. Delfina Redwood does not have her take O2 sats on patient due to him coming to see [place]. Dr. Delfina Redwood is out of the office today as well.  If anything else is needed please call Katelynn back at [phone]

## 2014-07-02 NOTE — Telephone Encounter (Signed)
Pt aware that we will need him to come by the office to get qualifying saturations for [facility] in order to get O2 coverage. Pt was seen 06/25/14 and O2 qualifying sats were not recorded. Pt was walked during office visit with MW but we do not have resting O2 RA and do not have recovery sats. Pt was noted to have dropped during ambulatory ox to 83% but there was no documentation of a recovery saturation stating if patient recovered on 5L/min or required 6L or more.  Chronic respiratory failure with hypoxia - Tanda Rockers, MD at 06/27/2014 5:51 AM     Status: Written Related Problem: Chronic respiratory failure with hypoxia   Expand All Collapse All   - 06/25/2014 Walked 5lpm x 50 ft slow / unsteady gait, stopped due to do to sob and sats 83%     Tech Comments:  slow pace/unstable gait/pt walked approx 50 ft and o2 sat decreased to 83% 5lpm pulsed, pulse was 120/pt was SOB//lmr   Pt aware this is our mistake and we apologize for any inconvenience. Pt aware that this visit is no charge and will only last about 10-33mins. Once qualifying sats are obtained, information will be sent to [facility] to file with insurance for continued coverage.  Pt states that he will call back at the beginning of next week and schedule an appt to come in to have this done, pt has to set up transportation. Will await patient call back.   Called and spoke with [place] at [place], note has been added to that patient's chart stating that our office is in the process of working this the patient on getting his qualifying O2 saturations. This is to stay on patient chart until they receive O2 sats from our office to ensure there is no lapse in coverage.

## 2014-07-02 NOTE — Telephone Encounter (Signed)
Pt is switching DME companies from [facility] to [place]. Called and spoke to [place] at [place]. Huey Romans is requesting qualifying sats and liter flow amount for order. Called and spoke to pt. Pt stated he has been on O2 for 8-9 years and believe his O2 was started at Dr. Lina Sar office- [facility]. Called and left vm on Dr. Lina Sar nurse line to return our call regarding the pt's O2 and if they have recent sats.  Suanne Marker spoke to [place] with [facility] to see if Lenna Sciara has info.

## 2014-07-04 ENCOUNTER — Telehealth: Payer: Self-pay | Admitting: Internal Medicine

## 2014-07-04 NOTE — Telephone Encounter (Signed)
The call back number for Mendel Ryder has been added message. Thanks

## 2014-07-04 NOTE — Telephone Encounter (Signed)
Noted pt will be using [facility] Joellen Jersey

## 2014-07-04 NOTE — Telephone Encounter (Signed)
Spoke w/ [name]. She reports anytime they rent out equipment they have a policy that you have to have checking/bank card on file. Pt did not want to do this and wanted to pay cash only. Pt then wanted his info sent to [facility]. [name] did so. Will also make our PCC's aware.

## 2014-07-04 NOTE — Telephone Encounter (Signed)
No phone number listed. Please advise Claiborne Billings thanks

## 2014-07-04 NOTE — Telephone Encounter (Signed)
Called spoke with [place]. She scheduled pt to come in next week to do his qualifying sat walk test. Nothing further needed

## 2014-07-09 ENCOUNTER — Telehealth: Payer: Self-pay | Admitting: *Deleted

## 2014-07-09 ENCOUNTER — Ambulatory Visit (INDEPENDENT_AMBULATORY_CARE_PROVIDER_SITE_OTHER): Payer: Commercial Managed Care - HMO

## 2014-07-09 DIAGNOSIS — J9611 Chronic respiratory failure with hypoxia: Secondary | ICD-10-CM

## 2014-07-09 DIAGNOSIS — J449 Chronic obstructive pulmonary disease, unspecified: Secondary | ICD-10-CM

## 2014-07-09 NOTE — Telephone Encounter (Signed)
Pt came in for re-qualification of O2 this afternoon and was 84% on RA.  Pt was placed back on 5 Liters Pulsed O2 to try and get a recovery sat and patient hovered at 86% Increased pulse dose to 6 Liters pulsed O2 and patient was 88% Placed patient on 3 Liters of Continuous O2 and the patient maintained a steady O2 at 93%. Pt was placed on 3 Liters because he is on 3 Liters with concentrator at home. I tested him on 2 Liters and he would not maintain above 90%  Pt is requesting a new POC that does continuous flow. Pt also requests to receive a light weight POC that creates its own O2.  Pt denied any increased SOB or breathing issues, states that he has been about the same since last seen by Dr Melvyn Novas 06/25/14 but that his O2 levels have been lower when using his portable oxygen. O2 levels are fine on concentrator.  Please advise if okay to place order for O2 change and POC. Thanks.

## 2014-07-09 NOTE — Telephone Encounter (Signed)
Fine with me if he qualifies for poc but I don't know that the equipment available will deliver 3lpm continuous> ok to ask home [employer] to do titration with the available machine to determine if can maintain > 90% on it.

## 2014-07-09 NOTE — Telephone Encounter (Signed)
New order for poc placed.  Nothing further needed.

## 2014-07-14 ENCOUNTER — Telehealth: Payer: Self-pay | Admitting: Internal Medicine

## 2014-07-14 NOTE — Telephone Encounter (Signed)
Spoke with the [name] at [facility]  She states that she visited the pt today and sats were between 87% and 92% 4lpm with exertion  She is asking if MW wants to change anything prior to his next visit 07/29/14  I advised that per MW- they need to titrate o2 to reach sats of 90% with exertion  She verbalized understanding  Nothing further needed

## 2014-07-15 ENCOUNTER — Telehealth: Payer: Self-pay | Admitting: Internal Medicine

## 2014-07-15 NOTE — Telephone Encounter (Addendum)
lmtcb for [employer].

## 2014-07-15 NOTE — Telephone Encounter (Signed)
[name] from [place] returned call - [phone]

## 2014-07-15 NOTE — Telephone Encounter (Signed)
THIS ORDER WAS FAXED TO [facility] 07/15/14 Joellen Jersey

## 2014-07-15 NOTE — Telephone Encounter (Signed)
Spoke with Megan with [facility]. Aware that order had to be sent to [facility] for O2 titration d/t insurance issues. Spoke with Huey Romans and [name] states that the patient is being sent to another DME and [facility] is handling this. Will send to [facility] to follow up on.

## 2014-07-22 DIAGNOSIS — R918 Other nonspecific abnormal finding of lung field: Secondary | ICD-10-CM | POA: Diagnosis not present

## 2014-07-22 DIAGNOSIS — J441 Chronic obstructive pulmonary disease with (acute) exacerbation: Secondary | ICD-10-CM | POA: Diagnosis not present

## 2014-07-22 DIAGNOSIS — Z9981 Dependence on supplemental oxygen: Secondary | ICD-10-CM | POA: Diagnosis not present

## 2014-07-22 DIAGNOSIS — J9621 Acute and chronic respiratory failure with hypoxia: Secondary | ICD-10-CM | POA: Diagnosis not present

## 2014-07-22 DIAGNOSIS — Z720 Tobacco use: Secondary | ICD-10-CM | POA: Diagnosis not present

## 2014-07-24 DIAGNOSIS — R6889 Other general symptoms and signs: Secondary | ICD-10-CM | POA: Diagnosis not present

## 2014-07-29 ENCOUNTER — Encounter: Payer: Self-pay | Admitting: Internal Medicine

## 2014-07-29 ENCOUNTER — Ambulatory Visit (INDEPENDENT_AMBULATORY_CARE_PROVIDER_SITE_OTHER): Payer: Commercial Managed Care - HMO | Admitting: Internal Medicine

## 2014-07-29 VITALS — BP 100/58 | HR 86 | Ht 72.0 in | Wt 176.0 lb

## 2014-07-29 DIAGNOSIS — R6889 Other general symptoms and signs: Secondary | ICD-10-CM | POA: Diagnosis not present

## 2014-07-29 DIAGNOSIS — J9611 Chronic respiratory failure with hypoxia: Secondary | ICD-10-CM

## 2014-07-29 DIAGNOSIS — J449 Chronic obstructive pulmonary disease, unspecified: Secondary | ICD-10-CM | POA: Diagnosis not present

## 2014-07-29 MED ORDER — BUDESONIDE-FORMOTEROL FUMARATE 160-4.5 MCG/ACT IN AERO: INHALATION_SPRAY | RESPIRATORY_TRACT | Status: DC

## 2014-07-29 NOTE — Progress Notes (Addendum)
Subjective:    Patient ID: [name], male   DOB: 1934/03/10   MRN: [id number]   Brief patient profile:  51 yowf quit smoking 2004 baseline 02 x 2007 with baseline 50 ft @ 3lpm s/p admit  Admit date: 06/09/2014 Discharge date: 06/13/2014 Recommendations for Outpatient Follow-up:  1. Discharge home with HHPT Discharge Diagnoses:  Principal Problem:  Acute on chronic respiratory failure with hypoxia Active Problems:  COPD with acute exacerbation  Multifocal atrial tachycardia  Abnormal chest x-ray with multiple lung nodules  Advanced COPD  Protein-calorie malnutrition  Systolic CHF with reduced left ventricular function, NYHA class 2 Multiple left lung nodules     History of present illness:  81 year old male with history of asthma, COPD on 3 L O2 via nasal cannula at home, hypertension, pulmonary nodules (following with surgery as outpatient) presented with progressive shortness of breath for past 2 months. Also had progressive weakness for the past 3 days. Patient used his nebulizers and inhalers at home without much relief. Patient admitted for COPD exacerbation. Patient was also found to have asymptomatic multifocal atrial tachycardia in the ED. Admitted to telemetry.  Hospital Course:  Acute on chronic hypoxic respiratory failure secondary to COPD exacerbation Placed on IV solumedrol, transition to oral prednisone, scheduled nebulizers and empiric Levaquin completed 5 day course on 11/27 Continued incentive spirometry/flutter involved. Maintaining o2 sat on 3L ( home dose) -CT angiogram chest negative for PE on admission -2-D echo shows mild systolic CHF with EF of 97-41% and grade 1 diastolic dysfunction. No wall motion abnormality. -Patient is on Advair 250-50 twice daily and DuoNeb at home. New Appointment established with pulmonary Dr. Melvyn Novas for next week. patient clinically improved. Will discharge on oral prednisone taper over next 12 days. Will prescribe  albuterol prn inhaler and Daily Spiriva. Continue prn duoneb and bid advair. Encouraged to use oxygen continuously.   Multifocal atrial tachycardia Started on Cardizem 60 mg 4 times a day. Switched to long-acting Cardizem 180 mg daily. HR stable on this dose and will be discharged on it. 2-D echo shows mild systolic dysfunction. Appreciate cardiology recommendation.  Mild systolic CHF See on 2D echo with EF of 45%. Appears euvolemic. Continue losartan.  Generalized weakness and protein calorie malnutrition Seen by physical therapy and recommended home health PT. Seen by dietitian and added ensure supplements  Multiple left lung nodules. follows with Dr Servando Snare. Given severe COPD, recommendations are for monitoring with follow up CT chest.    History of Present Illness  06/25/2014 Initial Constultation/Verdon Ferrante re: severe copd/ chronic 02 dep Chief Complaint  Patient presents with  . HFU    Pt states that his breathing has improved back to his normal baseline since hospital d/c.    3lpm at home / 5lpm walking 50 ft at most s cough/ congestion rec Ok to leave off spiriva for now Plan A = automatic = advair 250 /50 one twice daily  Plan B= backup  Only use your albuterol (proair) as a rescue medication to be used if you can't catch your breath by resting or doing a relaxed purse lip breathing pattern.  - The less you use it, the better it will work when you need it. - Ok to use up to 2 puffs  every 4 hours if you must but call for immediate appointment if use goes up over your usual need - Don't leave home without it !!  (think of it like the spare tire for your car)  Plan C =  nebulizer, only use if plan B fails, ok to use nebulizer up to every 4 hours if needed  Plan D = Doctor, call me Plan E = ER, go there if all else fails    07/29/2014 f/u ov/Koni Kannan re: GOLD IV/ 02 dep COPD maint on advair 250 bid but needs saba first this in am and neb saba  at least once a day  Chief Complaint   Patient presents with  . Follow-up    Pt states that his breathing is doing well today. He denies any new co's. He is using proair on average 2 x per day and neb once per day. o2 sats 80%5lpm pulsed at rest---increased to 90%3lpm cont.   extremely sedentary and able to function mostly in w/c without getting out of breath  No obvious day to day or daytime variabilty or assoc excess or purulent sputum  or cp or chest tightness, subjective wheeze overt sinus or hb symptoms. No unusual exp hx or h/o childhood pna/ asthma or knowledge of premature birth.  Sleeping ok without nocturnal  or early am exacerbation  of respiratory  c/o's or need for noct saba. Also denies any obvious fluctuation of symptoms with weather or environmental changes or other aggravating or alleviating factors except as outlined above   Current Medications, Allergies, Complete Past Medical History, Past Surgical History, Family History, and Social History were reviewed in [employer] record.  ROS  The following are not active complaints unless bolded sore throat, dysphagia, dental problems, itching, sneezing,  nasal congestion or excess/ purulent secretions, ear ache,   fever, chills, sweats, unintended wt loss, pleuritic or exertional cp, hemoptysis,  orthopnea pnd or leg swelling, presyncope, palpitations, heartburn, abdominal pain, anorexia, nausea, vomiting, diarrhea  or change in bowel or urinary habits, change in stools or urine, dysuria,hematuria,  rash, arthralgias, visual complaints, headache, numbness weakness or ataxia or problems with walking or coordination,  change in mood/affect or memory.            Objective:   Physical Exam   W/c disshevled wm nad/ w/c bound   07/29/2014        176  Wt Readings from Last 3 Encounters:  06/25/14 177 lb 12.8 oz (80.65 kg)  06/13/14 178 lb 8 oz (80.967 kg)  04/17/14 178 lb (80.74 kg)    Vital signs reviewed   HEENT mild turbinate edema.  Full  dentures Oropharynx no thrush or excess pnd or cobblestoning.  No JVD or cervical adenopathy. Mild accessory muscle hypertrophy. Trachea midline, nl thryroid. Chest was hyperinflated by percussion with diminished breath sounds and moderate increased exp time without wheeze. Hoover sign positive at mid inspiration. Regular rate and rhythm without murmur gallop or rub or increase P2 or edema.  Abd: no hsm, nl excursion. Ext warm without cyanosis or clubbing.   05/21/14 CTa chest 1. No evidence of pulmonary embolism. Respiratory motion limits subsegmental evaluation at the bases. 2. Advanced emphysema. 3. Bronchitis with multi focal mucoid impaction and right middle lobe atelectasis. 4. Trace right pleural effusion    Assessment:

## 2014-07-29 NOTE — Patient Instructions (Addendum)
Try symbicort 160 Take 2 puffs first thing in am and then another 2 puffs about 12 hours later in place of advair   Only use your albuterol as a rescue medication to be used if you can't catch your breath by resting or doing a relaxed purse lip breathing pattern.  - The less you use it, the better it will work when you need it. - Ok to use up to 2 puffs  every 4 hours if you must but call for immediate appointment if use goes up over your usual need - Don't leave home without it !!  (think of it like the spare tire for your car)   Only use duoneb to back up the albuterol if the albuterol fails to work to your satisfaction  Please schedule a follow up visit in 3 months but call sooner if needed

## 2014-07-30 ENCOUNTER — Encounter: Payer: Self-pay | Admitting: Internal Medicine

## 2014-07-30 NOTE — Assessment & Plan Note (Addendum)
PFTs 03/18/14  FEV1  0.77 (23%) ratio 38 and DLCO 16% -07/29/2014 p extensive coaching HFA effectiveness =    75% so try symbicort 160 2bid   Severe but chronic copd:  DDX of  difficult airways management all start with A and  include Adherence, Ace Inhibitors, Acid Reflux, Active Sinus Disease, Alpha 1 Antitripsin deficiency, Anxiety masquerading as Airways dz,  ABPA,  allergy(esp in young), Aspiration (esp in elderly), Adverse effects of DPI,  Active smokers, plus two Bs  = Bronchiectasis and Beta blocker use..and one C= CHF   Adherence is always the initial "prime suspect" and is a multilayered concern that requires a "trust but verify" approach in every patient - starting with knowing how to use medications, especially inhalers, correctly, keeping up with refills and understanding the fundamental difference between maintenance and prns vs those medications only taken for a very short course and then stopped and not refilled.  The proper method of use, as well as anticipated side effects, of a metered-dose inhaler are discussed and demonstrated to the patient. Improved effectiveness after extensive coaching during this visit to a level of approximately  75% so try symbicort 160 2bid   ? Adverse effects of dpi > try hfa  ? chf > appears well compensated on present rx  Lab Results  Component Value Date   PROBNP 310.2 05/12/2014       Each maintenance medication was reviewed in detail including most importantly the difference between maintenance and as needed and under what circumstances the prns are to be used.  Please see instructions for details which were reviewed in writing and the patient given a copy.

## 2014-07-30 NOTE — Assessment & Plan Note (Signed)
-    06/25/2014   Walked 5lpm  x  50 ft slow / unsteady gait, stopped due to do to sob and sats 83%  Although severe, he does fine on 3lpm continous / w/c bound so not need to change rx

## 2014-08-07 DIAGNOSIS — Z9981 Dependence on supplemental oxygen: Secondary | ICD-10-CM | POA: Diagnosis not present

## 2014-08-07 DIAGNOSIS — R918 Other nonspecific abnormal finding of lung field: Secondary | ICD-10-CM | POA: Diagnosis not present

## 2014-08-07 DIAGNOSIS — J9621 Acute and chronic respiratory failure with hypoxia: Secondary | ICD-10-CM | POA: Diagnosis not present

## 2014-08-07 DIAGNOSIS — Z720 Tobacco use: Secondary | ICD-10-CM | POA: Diagnosis not present

## 2014-08-07 DIAGNOSIS — J441 Chronic obstructive pulmonary disease with (acute) exacerbation: Secondary | ICD-10-CM | POA: Diagnosis not present

## 2014-08-08 DIAGNOSIS — J449 Chronic obstructive pulmonary disease, unspecified: Secondary | ICD-10-CM | POA: Diagnosis not present

## 2014-08-13 DIAGNOSIS — J441 Chronic obstructive pulmonary disease with (acute) exacerbation: Secondary | ICD-10-CM | POA: Diagnosis not present

## 2014-08-13 DIAGNOSIS — R918 Other nonspecific abnormal finding of lung field: Secondary | ICD-10-CM | POA: Diagnosis not present

## 2014-08-13 DIAGNOSIS — J9621 Acute and chronic respiratory failure with hypoxia: Secondary | ICD-10-CM | POA: Diagnosis not present

## 2014-08-13 DIAGNOSIS — Z9981 Dependence on supplemental oxygen: Secondary | ICD-10-CM | POA: Diagnosis not present

## 2014-08-13 DIAGNOSIS — Z720 Tobacco use: Secondary | ICD-10-CM | POA: Diagnosis not present

## 2014-08-14 DIAGNOSIS — R6889 Other general symptoms and signs: Secondary | ICD-10-CM | POA: Diagnosis not present

## 2014-08-15 DIAGNOSIS — J441 Chronic obstructive pulmonary disease with (acute) exacerbation: Secondary | ICD-10-CM | POA: Diagnosis not present

## 2014-08-15 DIAGNOSIS — Z720 Tobacco use: Secondary | ICD-10-CM | POA: Diagnosis not present

## 2014-08-15 DIAGNOSIS — J9621 Acute and chronic respiratory failure with hypoxia: Secondary | ICD-10-CM | POA: Diagnosis not present

## 2014-08-15 DIAGNOSIS — R918 Other nonspecific abnormal finding of lung field: Secondary | ICD-10-CM | POA: Diagnosis not present

## 2014-08-15 DIAGNOSIS — Z9981 Dependence on supplemental oxygen: Secondary | ICD-10-CM | POA: Diagnosis not present

## 2014-08-18 DIAGNOSIS — R918 Other nonspecific abnormal finding of lung field: Secondary | ICD-10-CM | POA: Diagnosis not present

## 2014-08-18 DIAGNOSIS — Z9981 Dependence on supplemental oxygen: Secondary | ICD-10-CM | POA: Diagnosis not present

## 2014-08-18 DIAGNOSIS — Z720 Tobacco use: Secondary | ICD-10-CM | POA: Diagnosis not present

## 2014-08-18 DIAGNOSIS — J9621 Acute and chronic respiratory failure with hypoxia: Secondary | ICD-10-CM | POA: Diagnosis not present

## 2014-08-18 DIAGNOSIS — J441 Chronic obstructive pulmonary disease with (acute) exacerbation: Secondary | ICD-10-CM | POA: Diagnosis not present

## 2014-08-19 DIAGNOSIS — Z720 Tobacco use: Secondary | ICD-10-CM | POA: Diagnosis not present

## 2014-08-19 DIAGNOSIS — J441 Chronic obstructive pulmonary disease with (acute) exacerbation: Secondary | ICD-10-CM | POA: Diagnosis not present

## 2014-08-19 DIAGNOSIS — R918 Other nonspecific abnormal finding of lung field: Secondary | ICD-10-CM | POA: Diagnosis not present

## 2014-08-19 DIAGNOSIS — J9621 Acute and chronic respiratory failure with hypoxia: Secondary | ICD-10-CM | POA: Diagnosis not present

## 2014-08-19 DIAGNOSIS — Z9981 Dependence on supplemental oxygen: Secondary | ICD-10-CM | POA: Diagnosis not present

## 2014-08-20 ENCOUNTER — Telehealth: Payer: Self-pay | Admitting: Internal Medicine

## 2014-08-20 NOTE — Telephone Encounter (Signed)
lmomtcb x1 

## 2014-08-21 DIAGNOSIS — Z720 Tobacco use: Secondary | ICD-10-CM | POA: Diagnosis not present

## 2014-08-21 DIAGNOSIS — J9621 Acute and chronic respiratory failure with hypoxia: Secondary | ICD-10-CM | POA: Diagnosis not present

## 2014-08-21 DIAGNOSIS — R918 Other nonspecific abnormal finding of lung field: Secondary | ICD-10-CM | POA: Diagnosis not present

## 2014-08-21 DIAGNOSIS — J441 Chronic obstructive pulmonary disease with (acute) exacerbation: Secondary | ICD-10-CM | POA: Diagnosis not present

## 2014-08-21 DIAGNOSIS — Z9981 Dependence on supplemental oxygen: Secondary | ICD-10-CM | POA: Diagnosis not present

## 2014-08-21 NOTE — Telephone Encounter (Signed)
Spoke with pt- made aware that the coupon given was in fact for a free Proair- is to be used when he runs out of current inhaler on hand. Pt is requesting a coupon for free Symbicort. Advised that we do not have a coupon for free Symbicort--Pt informed that he can go online and fill out an application to get one -- given instructions to register for free coupon online., envelope placed up front to pick up today/tomorrow.  Nothing further needed.

## 2014-08-21 NOTE — Telephone Encounter (Signed)
Calling back, please call back at [phone]

## 2014-08-22 DIAGNOSIS — Z720 Tobacco use: Secondary | ICD-10-CM | POA: Diagnosis not present

## 2014-08-22 DIAGNOSIS — Z9981 Dependence on supplemental oxygen: Secondary | ICD-10-CM | POA: Diagnosis not present

## 2014-08-22 DIAGNOSIS — J9621 Acute and chronic respiratory failure with hypoxia: Secondary | ICD-10-CM | POA: Diagnosis not present

## 2014-08-22 DIAGNOSIS — J441 Chronic obstructive pulmonary disease with (acute) exacerbation: Secondary | ICD-10-CM | POA: Diagnosis not present

## 2014-08-22 DIAGNOSIS — R918 Other nonspecific abnormal finding of lung field: Secondary | ICD-10-CM | POA: Diagnosis not present

## 2014-08-25 DIAGNOSIS — R918 Other nonspecific abnormal finding of lung field: Secondary | ICD-10-CM | POA: Diagnosis not present

## 2014-08-25 DIAGNOSIS — Z9981 Dependence on supplemental oxygen: Secondary | ICD-10-CM | POA: Diagnosis not present

## 2014-08-25 DIAGNOSIS — J9621 Acute and chronic respiratory failure with hypoxia: Secondary | ICD-10-CM | POA: Diagnosis not present

## 2014-08-25 DIAGNOSIS — Z720 Tobacco use: Secondary | ICD-10-CM | POA: Diagnosis not present

## 2014-08-25 DIAGNOSIS — J441 Chronic obstructive pulmonary disease with (acute) exacerbation: Secondary | ICD-10-CM | POA: Diagnosis not present

## 2014-08-27 DIAGNOSIS — J441 Chronic obstructive pulmonary disease with (acute) exacerbation: Secondary | ICD-10-CM | POA: Diagnosis not present

## 2014-08-27 DIAGNOSIS — Z720 Tobacco use: Secondary | ICD-10-CM | POA: Diagnosis not present

## 2014-08-27 DIAGNOSIS — J9621 Acute and chronic respiratory failure with hypoxia: Secondary | ICD-10-CM | POA: Diagnosis not present

## 2014-08-27 DIAGNOSIS — R918 Other nonspecific abnormal finding of lung field: Secondary | ICD-10-CM | POA: Diagnosis not present

## 2014-08-27 DIAGNOSIS — Z9981 Dependence on supplemental oxygen: Secondary | ICD-10-CM | POA: Diagnosis not present

## 2014-08-28 DIAGNOSIS — J441 Chronic obstructive pulmonary disease with (acute) exacerbation: Secondary | ICD-10-CM | POA: Diagnosis not present

## 2014-08-28 DIAGNOSIS — Z9981 Dependence on supplemental oxygen: Secondary | ICD-10-CM | POA: Diagnosis not present

## 2014-08-28 DIAGNOSIS — Z720 Tobacco use: Secondary | ICD-10-CM | POA: Diagnosis not present

## 2014-08-28 DIAGNOSIS — J9621 Acute and chronic respiratory failure with hypoxia: Secondary | ICD-10-CM | POA: Diagnosis not present

## 2014-08-28 DIAGNOSIS — R918 Other nonspecific abnormal finding of lung field: Secondary | ICD-10-CM | POA: Diagnosis not present

## 2014-09-02 DIAGNOSIS — J9621 Acute and chronic respiratory failure with hypoxia: Secondary | ICD-10-CM | POA: Diagnosis not present

## 2014-09-02 DIAGNOSIS — Z720 Tobacco use: Secondary | ICD-10-CM | POA: Diagnosis not present

## 2014-09-02 DIAGNOSIS — J441 Chronic obstructive pulmonary disease with (acute) exacerbation: Secondary | ICD-10-CM | POA: Diagnosis not present

## 2014-09-02 DIAGNOSIS — R918 Other nonspecific abnormal finding of lung field: Secondary | ICD-10-CM | POA: Diagnosis not present

## 2014-09-02 DIAGNOSIS — Z9981 Dependence on supplemental oxygen: Secondary | ICD-10-CM | POA: Diagnosis not present

## 2014-09-04 DIAGNOSIS — Z720 Tobacco use: Secondary | ICD-10-CM | POA: Diagnosis not present

## 2014-09-04 DIAGNOSIS — J9621 Acute and chronic respiratory failure with hypoxia: Secondary | ICD-10-CM | POA: Diagnosis not present

## 2014-09-04 DIAGNOSIS — R918 Other nonspecific abnormal finding of lung field: Secondary | ICD-10-CM | POA: Diagnosis not present

## 2014-09-04 DIAGNOSIS — Z9981 Dependence on supplemental oxygen: Secondary | ICD-10-CM | POA: Diagnosis not present

## 2014-09-04 DIAGNOSIS — J441 Chronic obstructive pulmonary disease with (acute) exacerbation: Secondary | ICD-10-CM | POA: Diagnosis not present

## 2014-09-05 DIAGNOSIS — J441 Chronic obstructive pulmonary disease with (acute) exacerbation: Secondary | ICD-10-CM | POA: Diagnosis not present

## 2014-09-05 DIAGNOSIS — Z9981 Dependence on supplemental oxygen: Secondary | ICD-10-CM | POA: Diagnosis not present

## 2014-09-05 DIAGNOSIS — R918 Other nonspecific abnormal finding of lung field: Secondary | ICD-10-CM | POA: Diagnosis not present

## 2014-09-05 DIAGNOSIS — Z720 Tobacco use: Secondary | ICD-10-CM | POA: Diagnosis not present

## 2014-09-05 DIAGNOSIS — J9621 Acute and chronic respiratory failure with hypoxia: Secondary | ICD-10-CM | POA: Diagnosis not present

## 2014-09-06 DIAGNOSIS — J441 Chronic obstructive pulmonary disease with (acute) exacerbation: Secondary | ICD-10-CM | POA: Diagnosis not present

## 2014-09-06 DIAGNOSIS — J9621 Acute and chronic respiratory failure with hypoxia: Secondary | ICD-10-CM | POA: Diagnosis not present

## 2014-09-06 DIAGNOSIS — Z720 Tobacco use: Secondary | ICD-10-CM | POA: Diagnosis not present

## 2014-09-06 DIAGNOSIS — Z9981 Dependence on supplemental oxygen: Secondary | ICD-10-CM | POA: Diagnosis not present

## 2014-09-06 DIAGNOSIS — R918 Other nonspecific abnormal finding of lung field: Secondary | ICD-10-CM | POA: Diagnosis not present

## 2014-09-08 DIAGNOSIS — J449 Chronic obstructive pulmonary disease, unspecified: Secondary | ICD-10-CM | POA: Diagnosis not present

## 2014-09-10 DIAGNOSIS — J9621 Acute and chronic respiratory failure with hypoxia: Secondary | ICD-10-CM | POA: Diagnosis not present

## 2014-09-10 DIAGNOSIS — Z720 Tobacco use: Secondary | ICD-10-CM | POA: Diagnosis not present

## 2014-09-10 DIAGNOSIS — J441 Chronic obstructive pulmonary disease with (acute) exacerbation: Secondary | ICD-10-CM | POA: Diagnosis not present

## 2014-09-10 DIAGNOSIS — Z9981 Dependence on supplemental oxygen: Secondary | ICD-10-CM | POA: Diagnosis not present

## 2014-09-10 DIAGNOSIS — R918 Other nonspecific abnormal finding of lung field: Secondary | ICD-10-CM | POA: Diagnosis not present

## 2014-09-12 DIAGNOSIS — R918 Other nonspecific abnormal finding of lung field: Secondary | ICD-10-CM | POA: Diagnosis not present

## 2014-09-12 DIAGNOSIS — J9621 Acute and chronic respiratory failure with hypoxia: Secondary | ICD-10-CM | POA: Diagnosis not present

## 2014-09-12 DIAGNOSIS — J441 Chronic obstructive pulmonary disease with (acute) exacerbation: Secondary | ICD-10-CM | POA: Diagnosis not present

## 2014-09-12 DIAGNOSIS — Z720 Tobacco use: Secondary | ICD-10-CM | POA: Diagnosis not present

## 2014-09-12 DIAGNOSIS — Z9981 Dependence on supplemental oxygen: Secondary | ICD-10-CM | POA: Diagnosis not present

## 2014-09-13 ENCOUNTER — Emergency Department (HOSPITAL_COMMUNITY)
Admission: EM | Admit: 2014-09-13 | Discharge: 2014-09-14 | Disposition: A | Discharge status: Home / Self Care | Payer: Commercial Managed Care - HMO | Attending: Emergency Medicine | Admitting: Emergency Medicine

## 2014-09-13 ENCOUNTER — Emergency Department (HOSPITAL_COMMUNITY): Payer: Commercial Managed Care - HMO

## 2014-09-13 ENCOUNTER — Encounter (HOSPITAL_COMMUNITY): Payer: Self-pay | Admitting: Emergency Medicine

## 2014-09-13 DIAGNOSIS — F419 Anxiety disorder, unspecified: Secondary | ICD-10-CM | POA: Insufficient documentation

## 2014-09-13 DIAGNOSIS — N132 Hydronephrosis with renal and ureteral calculous obstruction: Secondary | ICD-10-CM | POA: Diagnosis not present

## 2014-09-13 DIAGNOSIS — N1330 Unspecified hydronephrosis: Secondary | ICD-10-CM | POA: Diagnosis not present

## 2014-09-13 DIAGNOSIS — N200 Calculus of kidney: Secondary | ICD-10-CM | POA: Diagnosis not present

## 2014-09-13 DIAGNOSIS — Z87891 Personal history of nicotine dependence: Secondary | ICD-10-CM | POA: Insufficient documentation

## 2014-09-13 DIAGNOSIS — Z79899 Other long term (current) drug therapy: Secondary | ICD-10-CM | POA: Diagnosis not present

## 2014-09-13 DIAGNOSIS — I10 Essential (primary) hypertension: Secondary | ICD-10-CM | POA: Diagnosis not present

## 2014-09-13 DIAGNOSIS — J449 Chronic obstructive pulmonary disease, unspecified: Secondary | ICD-10-CM | POA: Insufficient documentation

## 2014-09-13 DIAGNOSIS — N201 Calculus of ureter: Secondary | ICD-10-CM | POA: Diagnosis not present

## 2014-09-13 DIAGNOSIS — Z9981 Dependence on supplemental oxygen: Secondary | ICD-10-CM | POA: Diagnosis not present

## 2014-09-13 DIAGNOSIS — K8020 Calculus of gallbladder without cholecystitis without obstruction: Secondary | ICD-10-CM | POA: Diagnosis not present

## 2014-09-13 DIAGNOSIS — K2970 Gastritis, unspecified, without bleeding: Secondary | ICD-10-CM | POA: Diagnosis not present

## 2014-09-13 DIAGNOSIS — R109 Unspecified abdominal pain: Secondary | ICD-10-CM | POA: Diagnosis present

## 2014-09-13 DIAGNOSIS — F329 Major depressive disorder, single episode, unspecified: Secondary | ICD-10-CM | POA: Insufficient documentation

## 2014-09-13 DIAGNOSIS — R110 Nausea: Secondary | ICD-10-CM | POA: Diagnosis not present

## 2014-09-13 LAB — CBC WITH DIFFERENTIAL/PLATELET
BASOS PCT: 0 % (ref 0–1)
Basophils Absolute: 0.1 10*3/uL (ref 0.0–0.1)
EOS PCT: 1 % (ref 0–5)
Eosinophils Absolute: 0.1 10*3/uL (ref 0.0–0.7)
HCT: 44.3 % (ref 39.0–52.0)
HEMOGLOBIN: 13.7 g/dL (ref 13.0–17.0)
LYMPHS ABS: 1.4 10*3/uL (ref 0.7–4.0)
LYMPHS PCT: 12 % (ref 12–46)
MCH: 30.6 pg (ref 26.0–34.0)
MCHC: 30.9 g/dL (ref 30.0–36.0)
MCV: 99.1 fL (ref 78.0–100.0)
MONO ABS: 1.2 10*3/uL — AB (ref 0.1–1.0)
Monocytes Relative: 10 % (ref 3–12)
NEUTROS ABS: 9.1 10*3/uL — AB (ref 1.7–7.7)
Neutrophils Relative %: 77 % (ref 43–77)
PLATELETS: 150 10*3/uL (ref 150–400)
RBC: 4.47 MIL/uL (ref 4.22–5.81)
RDW: 13.9 % (ref 11.5–15.5)
WBC: 11.8 10*3/uL — ABNORMAL HIGH (ref 4.0–10.5)

## 2014-09-13 LAB — COMPREHENSIVE METABOLIC PANEL
ALK PHOS: 56 U/L (ref 39–117)
ALT: 14 U/L (ref 0–53)
AST: 22 U/L (ref 0–37)
Albumin: 3.8 g/dL (ref 3.5–5.2)
Anion gap: 5 (ref 5–15)
BUN: 22 mg/dL (ref 6–23)
CO2: 26 mmol/L (ref 19–32)
CREATININE: 1.37 mg/dL — AB (ref 0.50–1.35)
Calcium: 8.7 mg/dL (ref 8.4–10.5)
Chloride: 110 mmol/L (ref 96–112)
GFR, EST AFRICAN AMERICAN: 55 mL/min — AB (ref >90)
GFR, EST NON AFRICAN AMERICAN: 48 mL/min — AB (ref >90)
GLUCOSE: 128 mg/dL — AB (ref 70–99)
POTASSIUM: 3.5 mmol/L (ref 3.5–5.1)
Sodium: 141 mmol/L (ref 135–145)
TOTAL PROTEIN: 6.7 g/dL (ref 6.0–8.3)
Total Bilirubin: 0.8 mg/dL (ref 0.3–1.2)

## 2014-09-13 LAB — LIPASE, BLOOD: Lipase: 26 U/L (ref 11–59)

## 2014-09-13 MED ORDER — HYDROMORPHONE HCL 1 MG/ML IJ SOLN
0.5000 mg | INTRAMUSCULAR | Status: AC | PRN
  Administered 2014-09-13 – 2014-09-14 (×3): 0.5 mg via INTRAVENOUS
  Filled 2014-09-13 (×3): qty 1

## 2014-09-13 MED ORDER — SODIUM CHLORIDE 0.9 % IV SOLN
1000.0000 mL | INTRAVENOUS | Status: DC
  Administered 2014-09-13: 1000 mL via INTRAVENOUS

## 2014-09-13 MED ORDER — ONDANSETRON HCL 4 MG/2ML IJ SOLN
4.0000 mg | Freq: Once | INTRAMUSCULAR | Status: AC
  Administered 2014-09-13: 4 mg via INTRAVENOUS
  Filled 2014-09-13: qty 2

## 2014-09-13 NOTE — ED Notes (Addendum)
Per EMS , pt. Is from home with complaint right flank pain at 10/10 which started at 7this evening, pt. Had history of kidney stone. Pt. Took 2 percocet upon onset of pain and was told by his PC to come to ED if no relief. Pt. Has COPD , at4L/min [place], denies SOB. recieved 25mcg of IV Fentanyl on board EMS, somewhat effective, pain down to 6/10.Alert and oriented x3.

## 2014-09-13 NOTE — ED Notes (Signed)
Pt. Stated he has DNR form left at home.

## 2014-09-13 NOTE — ED Provider Notes (Addendum)
CSN: [id number]     Arrival date & time 09/13/14  2149 History   First MD Initiated Contact with Patient 09/13/14 2204     Chief Complaint  Patient presents with  . Flank Pain   Patient is a 81 y.o. male presenting with flank pain. The history is provided by the patient.  Flank Pain This is a recurrent problem. The current episode started 3 to 5 hours ago. The problem occurs constantly. The problem has been gradually worsening. Pertinent negatives include no chest pain and no shortness of breath. Nothing aggravates the symptoms. Nothing relieves the symptoms. Treatments tried: 2 , 5 mg percocet. The treatment provided no relief.  Pt has history of kidney stones.  This feels similar to that.  Pt has history of COPD.  No acute issues with his breathing at this time.  Past Medical History  Diagnosis Date  . Asthma   . COPD (chronic obstructive pulmonary disease)   . Emphysema of lung   . Hypertension   . On home O2     3L N/C continuous  . Multiple lung nodules   . Shortness of breath dyspnea   . Anxiety   . Depression    Past Surgical History  Procedure Laterality Date  . Lump removed from neck  2004  . Hernia repair  2008   History reviewed. No pertinent family history. History  Substance Use Topics  . Smoking status: Former Smoker -- 1.50 packs/day for 50 years    Types: Cigarettes    Quit date: 09/06/2002  . Smokeless tobacco: Never Used  . Alcohol Use: No    Review of Systems  Constitutional: Negative for fever.  Respiratory: Negative for shortness of breath.   Cardiovascular: Negative for chest pain.  Gastrointestinal: Positive for nausea. Negative for vomiting.  Genitourinary: Positive for flank pain. Negative for dysuria and hematuria.  All other systems reviewed and are negative.     Allergies  Review of patient's allergies indicates no known allergies.  Home Medications   Prior to Admission medications   Medication Sig Start Date End Date Taking?  Authorizing Provider  albuterol (PROVENTIL HFA;VENTOLIN HFA) 108 (90 BASE) MCG/ACT inhaler Inhale 2 puffs into the lungs every 6 (six) hours as needed for wheezing or shortness of breath.   Yes Historical Provider, MD  ALPRAZolam Duanne Moron) 0.5 MG tablet Take 1 tablet (0.5 mg total) by mouth at bedtime as needed for anxiety. 09/13/13  Yes Kandice Hams, MD  budesonide-formoterol [facility]) 160-4.5 MCG/ACT inhaler Take 2 puffs first thing in am and then another 2 puffs about 12 hours later. Patient taking differently: Inhale 2 puffs into the lungs 2 (two) times daily. Take 2 puffs first thing in am and then another 2 puffs about 12 hours later. 07/29/14  Yes Tanda Rockers, MD  citalopram (CELEXA) 20 MG tablet Take 20 mg by mouth daily.   Yes Historical Provider, MD  ipratropium-albuterol (DUONEB) 0.5-2.5 (3) MG/3ML SOLN Take 3 mLs by nebulization every 6 (six) hours as needed (increased / severe wheezing , perisstent shortness of breath). 06/13/14  Yes Nishant Dhungel, MD  guaiFENesin (MUCINEX) 600 MG 12 hr tablet Take 1 tablet (600 mg total) by mouth 2 (two) times daily. Patient not taking: Reported on 09/13/2014 06/13/14   Nishant Dhungel, MD  losartan (COZAAR) 50 MG tablet Take 1 tablet (50 mg total) by mouth daily. Patient not taking: Reported on 09/13/2014 06/13/14   Nishant Dhungel, MD  ondansetron (ZOFRAN) 4 MG tablet Take 1  tablet (4 mg total) by mouth every 6 (six) hours. 09/14/14   Dorie Rank, MD  oxyCODONE-acetaminophen (PERCOCET/ROXICET) 5-325 MG per tablet Take 1-2 tablets by mouth every 6 (six) hours as needed. 09/14/14   Dorie Rank, MD   BP 164/93 mmHg  Pulse 81  Resp 24  SpO2 94% Physical Exam  Constitutional: No distress.  HENT:  Head: Normocephalic and atraumatic.  Right Ear: External ear normal.  Left Ear: External ear normal.  Eyes: Conjunctivae are normal. Right eye exhibits no discharge. Left eye exhibits no discharge. No scleral icterus.  Neck: Neck supple. No tracheal  deviation present.  Cardiovascular: Normal rate, regular rhythm and intact distal pulses.   Pulmonary/Chest: Effort normal and breath sounds normal. No stridor. No respiratory distress. He has no wheezes. He has no rales.  Abdominal: Soft. Bowel sounds are normal. He exhibits no distension. There is tenderness. There is CVA tenderness. There is no rigidity, no rebound, no guarding and negative Murphy's sign. No hernia.  Musculoskeletal: He exhibits no edema or tenderness.  Neurological: He is alert. He has normal strength. No cranial nerve deficit (no facial droop, extraocular movements intact, no slurred speech) or sensory deficit. He exhibits normal muscle tone. He displays no seizure activity. Coordination normal.  Skin: Skin is warm and dry. No rash noted.  Psychiatric: He has a normal mood and affect.  Nursing note and vitals reviewed.   ED Course  Procedures (including critical care time) Labs Review Labs Reviewed  COMPREHENSIVE METABOLIC PANEL - Abnormal; Notable for the following:    Glucose, Bld 128 (*)    Creatinine, Ser 1.37 (*)    GFR calc non Af Amer 48 (*)    GFR calc Af Amer 55 (*)    All other components within normal limits  CBC WITH DIFFERENTIAL/PLATELET - Abnormal; Notable for the following:    WBC 11.8 (*)    Neutro Abs 9.1 (*)    Monocytes Absolute 1.2 (*)    All other components within normal limits  LIPASE, BLOOD  URINALYSIS, ROUTINE W REFLEX MICROSCOPIC    Imaging Review Ct Abdomen Pelvis Wo Contrast  09/13/2014   CLINICAL DATA:  Right flank pain for 4 hours. History of kidney stones.  EXAM: CT ABDOMEN AND PELVIS WITHOUT CONTRAST  TECHNIQUE: Multidetector CT imaging of the abdomen and pelvis was performed following the standard protocol without IV contrast.  COMPARISON:  PET-CT 06/04/2013  FINDINGS: Severe emphysema at the lung bases. Minimal dependent atelectasis. No consolidation.  There is an obstructing 5 mm stone in the right proximal ureter with resultant  mild hydroureteronephrosis and moderate perinephric stranding. Additional nonobstructing 8 mm stone in the lower right kidney. There is a 2.7 cm cyst in the interpolar right kidney that is unchanged. There is a 13 mm stone in the posterior left renal pelvis that is unchanged from prior exam. No left hydronephrosis.  There are no focal hepatic lesions. Calcified gallstones within physiologically distended gallbladder. Mild pancreatic atrophy without inflammatory change. The spleen and adrenal glands are normal.  Stomach is physiologically distended with ingested contents. There are no dilated or thickened bowel loops. Small volume of colonic stool. The appendix is tentatively identified and normal.  The abdominal aorta is normal in caliber with dense atherosclerosis. 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  Urinary bladder is decompressed without stone. Prostate gland is normal in size. There is no pelvic free fluid.  Degenerative change in the spine, no acute or  suspicious osseous abnormality.  IMPRESSION: 1. Obstructing 5 mm stone in the right proximal ureter with resultant hydroureteronephrosis and perinephric stranding. 2. Additional nonobstructing stones in both kidneys, including 51mm stone in the left renal pelvis, however unchanged without left hydronephrosis. 3. Cholelithiasis.   Electronically Signed   By: Jeb Levering M.D.   On: 09/13/2014 23:24    Medications  0.9 %  sodium chloride infusion (1,000 mLs Intravenous New Bag/Given 09/13/14 2239)  HYDROmorphone (DILAUDID) injection 0.5 mg (0.5 mg Intravenous Given 09/13/14 2344)  ondansetron (ZOFRAN) injection 4 mg (4 mg Intravenous Given 09/13/14 2239)     MDM   Final diagnoses:  Ureteral stone with hydronephrosis    Pt has a recurrent ureteral stone.  Will give dilaudid IV in the ED to control pain.  Plan on dc once pt is more comfortable with urology follow up .    Dorie Rank,  MD 09/14/14 0010  Pain is much better now after additional medications.  Will dc home with medications.  Pt will need urology follow up.  Dorie Rank, MD 09/14/14 [phone]

## 2014-09-13 NOTE — ED Notes (Signed)
MD at bedside. 

## 2014-09-13 NOTE — ED Notes (Signed)
Bed: WA04 Expected date:  Expected time:  Means of arrival:  Comments: EMS flank pain / hx of kidney stones

## 2014-09-14 DIAGNOSIS — J449 Chronic obstructive pulmonary disease, unspecified: Secondary | ICD-10-CM | POA: Diagnosis not present

## 2014-09-14 DIAGNOSIS — Z9981 Dependence on supplemental oxygen: Secondary | ICD-10-CM | POA: Diagnosis not present

## 2014-09-14 DIAGNOSIS — Z79899 Other long term (current) drug therapy: Secondary | ICD-10-CM | POA: Diagnosis not present

## 2014-09-14 DIAGNOSIS — Z87891 Personal history of nicotine dependence: Secondary | ICD-10-CM | POA: Diagnosis not present

## 2014-09-14 DIAGNOSIS — F419 Anxiety disorder, unspecified: Secondary | ICD-10-CM | POA: Diagnosis not present

## 2014-09-14 DIAGNOSIS — N132 Hydronephrosis with renal and ureteral calculous obstruction: Secondary | ICD-10-CM | POA: Diagnosis not present

## 2014-09-14 DIAGNOSIS — F329 Major depressive disorder, single episode, unspecified: Secondary | ICD-10-CM | POA: Diagnosis not present

## 2014-09-14 DIAGNOSIS — I10 Essential (primary) hypertension: Secondary | ICD-10-CM | POA: Diagnosis not present

## 2014-09-14 LAB — URINALYSIS, ROUTINE W REFLEX MICROSCOPIC
BILIRUBIN URINE: NEGATIVE
GLUCOSE, UA: NEGATIVE mg/dL
KETONES UR: NEGATIVE mg/dL
Leukocytes, UA: NEGATIVE
Nitrite: NEGATIVE
PROTEIN: NEGATIVE mg/dL
SPECIFIC GRAVITY, URINE: 1.022 (ref 1.005–1.030)
Urobilinogen, UA: 0.2 mg/dL (ref 0.0–1.0)
pH: 6 (ref 5.0–8.0)

## 2014-09-14 LAB — URINE MICROSCOPIC-ADD ON

## 2014-09-14 MED ORDER — OXYCODONE-ACETAMINOPHEN 5-325 MG PO TABS: 1.0000 | ORAL_TABLET | Freq: Four times a day (QID) | ORAL | Status: DC | PRN

## 2014-09-14 MED ORDER — OXYCODONE-ACETAMINOPHEN 5-325 MG PO TABS
2.0000 | ORAL_TABLET | Freq: Once | ORAL | Status: AC
  Administered 2014-09-14: 2 via ORAL
  Filled 2014-09-14: qty 2

## 2014-09-14 MED ORDER — ONDANSETRON HCL 4 MG PO TABS: 4.0000 mg | ORAL_TABLET | Freq: Four times a day (QID) | ORAL | Status: DC

## 2014-09-14 NOTE — Discharge Instructions (Signed)

## 2014-09-14 NOTE — ED Notes (Signed)
Pt. Is for discharge, pt. Requested to see MD for his pain medicine to take home. Pt. Stated that he preferred Diluadid than percocet for his prescription.pt. stated " Percocet is not working and that's why i am here for." to notify MD.

## 2014-09-14 NOTE — ED Notes (Signed)
Pt. Is in bed resting, for discharge, awaiting for family  Member to pick up pt. With his portable oxygen to be used while transport. Pt. Uses oxygen at home. Charge Nurse aware.

## 2014-09-17 ENCOUNTER — Telehealth: Payer: Self-pay | Admitting: Internal Medicine

## 2014-09-17 MED ORDER — ALBUTEROL SULFATE 108 (90 BASE) MCG/ACT IN AEPB: 2.0000 | INHALATION_SPRAY | Freq: Four times a day (QID) | RESPIRATORY_TRACT | Status: DC | PRN

## 2014-09-17 NOTE — Telephone Encounter (Signed)
Called and spoke with pt  And he is wanting the proair respiclick sent to his pharmacy.  This has been sent in and he is aware. Nothing further is needed.

## 2014-09-17 NOTE — Telephone Encounter (Signed)
Pt states that when he was here last MW gave him a sample of ProAir. This is not documented in his chart. He is needing a refill on this.  MW - please advise. Thanks.

## 2014-09-17 NOTE — Telephone Encounter (Signed)
Not clear whether he means the respiclick or the hfa but ok to refill whichever one he has x 11 (it is listed as prn in the [facility] instruction  sheet)

## 2014-09-19 DIAGNOSIS — R918 Other nonspecific abnormal finding of lung field: Secondary | ICD-10-CM | POA: Diagnosis not present

## 2014-09-19 DIAGNOSIS — Z720 Tobacco use: Secondary | ICD-10-CM | POA: Diagnosis not present

## 2014-09-19 DIAGNOSIS — J441 Chronic obstructive pulmonary disease with (acute) exacerbation: Secondary | ICD-10-CM | POA: Diagnosis not present

## 2014-09-19 DIAGNOSIS — Z9981 Dependence on supplemental oxygen: Secondary | ICD-10-CM | POA: Diagnosis not present

## 2014-09-19 DIAGNOSIS — J9621 Acute and chronic respiratory failure with hypoxia: Secondary | ICD-10-CM | POA: Diagnosis not present

## 2014-09-24 DIAGNOSIS — Z9981 Dependence on supplemental oxygen: Secondary | ICD-10-CM | POA: Diagnosis not present

## 2014-09-24 DIAGNOSIS — Z720 Tobacco use: Secondary | ICD-10-CM | POA: Diagnosis not present

## 2014-09-24 DIAGNOSIS — R918 Other nonspecific abnormal finding of lung field: Secondary | ICD-10-CM | POA: Diagnosis not present

## 2014-09-24 DIAGNOSIS — J9621 Acute and chronic respiratory failure with hypoxia: Secondary | ICD-10-CM | POA: Diagnosis not present

## 2014-09-24 DIAGNOSIS — J441 Chronic obstructive pulmonary disease with (acute) exacerbation: Secondary | ICD-10-CM | POA: Diagnosis not present

## 2014-09-26 DIAGNOSIS — J9621 Acute and chronic respiratory failure with hypoxia: Secondary | ICD-10-CM | POA: Diagnosis not present

## 2014-09-26 DIAGNOSIS — Z720 Tobacco use: Secondary | ICD-10-CM | POA: Diagnosis not present

## 2014-09-26 DIAGNOSIS — J441 Chronic obstructive pulmonary disease with (acute) exacerbation: Secondary | ICD-10-CM | POA: Diagnosis not present

## 2014-09-26 DIAGNOSIS — R918 Other nonspecific abnormal finding of lung field: Secondary | ICD-10-CM | POA: Diagnosis not present

## 2014-09-26 DIAGNOSIS — Z9981 Dependence on supplemental oxygen: Secondary | ICD-10-CM | POA: Diagnosis not present

## 2014-09-30 DIAGNOSIS — R918 Other nonspecific abnormal finding of lung field: Secondary | ICD-10-CM | POA: Diagnosis not present

## 2014-09-30 DIAGNOSIS — J9621 Acute and chronic respiratory failure with hypoxia: Secondary | ICD-10-CM | POA: Diagnosis not present

## 2014-09-30 DIAGNOSIS — Z720 Tobacco use: Secondary | ICD-10-CM | POA: Diagnosis not present

## 2014-09-30 DIAGNOSIS — J441 Chronic obstructive pulmonary disease with (acute) exacerbation: Secondary | ICD-10-CM | POA: Diagnosis not present

## 2014-09-30 DIAGNOSIS — Z9981 Dependence on supplemental oxygen: Secondary | ICD-10-CM | POA: Diagnosis not present

## 2014-10-01 DIAGNOSIS — Z720 Tobacco use: Secondary | ICD-10-CM | POA: Diagnosis not present

## 2014-10-01 DIAGNOSIS — J9621 Acute and chronic respiratory failure with hypoxia: Secondary | ICD-10-CM | POA: Diagnosis not present

## 2014-10-01 DIAGNOSIS — J441 Chronic obstructive pulmonary disease with (acute) exacerbation: Secondary | ICD-10-CM | POA: Diagnosis not present

## 2014-10-01 DIAGNOSIS — R918 Other nonspecific abnormal finding of lung field: Secondary | ICD-10-CM | POA: Diagnosis not present

## 2014-10-01 DIAGNOSIS — Z9981 Dependence on supplemental oxygen: Secondary | ICD-10-CM | POA: Diagnosis not present

## 2014-10-03 DIAGNOSIS — J9621 Acute and chronic respiratory failure with hypoxia: Secondary | ICD-10-CM | POA: Diagnosis not present

## 2014-10-03 DIAGNOSIS — Z720 Tobacco use: Secondary | ICD-10-CM | POA: Diagnosis not present

## 2014-10-03 DIAGNOSIS — Z9981 Dependence on supplemental oxygen: Secondary | ICD-10-CM | POA: Diagnosis not present

## 2014-10-03 DIAGNOSIS — R918 Other nonspecific abnormal finding of lung field: Secondary | ICD-10-CM | POA: Diagnosis not present

## 2014-10-03 DIAGNOSIS — J441 Chronic obstructive pulmonary disease with (acute) exacerbation: Secondary | ICD-10-CM | POA: Diagnosis not present

## 2014-10-07 DIAGNOSIS — J449 Chronic obstructive pulmonary disease, unspecified: Secondary | ICD-10-CM | POA: Diagnosis not present

## 2014-10-07 DIAGNOSIS — J441 Chronic obstructive pulmonary disease with (acute) exacerbation: Secondary | ICD-10-CM | POA: Diagnosis not present

## 2014-10-07 DIAGNOSIS — R918 Other nonspecific abnormal finding of lung field: Secondary | ICD-10-CM | POA: Diagnosis not present

## 2014-10-07 DIAGNOSIS — Z9981 Dependence on supplemental oxygen: Secondary | ICD-10-CM | POA: Diagnosis not present

## 2014-10-07 DIAGNOSIS — J9621 Acute and chronic respiratory failure with hypoxia: Secondary | ICD-10-CM | POA: Diagnosis not present

## 2014-10-07 DIAGNOSIS — Z720 Tobacco use: Secondary | ICD-10-CM | POA: Diagnosis not present

## 2014-10-08 ENCOUNTER — Other Ambulatory Visit: Payer: Commercial Managed Care - HMO | Admitting: *Deleted

## 2014-10-15 ENCOUNTER — Encounter: Payer: Self-pay | Admitting: *Deleted

## 2014-10-15 ENCOUNTER — Other Ambulatory Visit: Payer: Self-pay | Admitting: *Deleted

## 2014-10-15 NOTE — Patient Outreach (Signed)
[name]) Care [name]  [name] Social Work  10/15/2014  Dalton [name] Sep 23, 1933 [id number]    Current Medications:  Current Outpatient Prescriptions  Medication Sig Dispense Refill  . Albuterol Sulfate (PROAIR RESPICLICK) 275 (90 BASE) MCG/ACT AEPB Inhale 2 puffs into the lungs every 6 (six) hours as needed. 1 each 11  . ALPRAZolam (XANAX) 0.5 MG tablet Take 1 tablet (0.5 mg total) by mouth at bedtime as needed for anxiety. 30 tablet 0  . budesonide-formoterol (SYMBICORT) 160-4.5 MCG/ACT inhaler Take 2 puffs first thing in am and then another 2 puffs about 12 hours later. (Patient taking differently: Inhale 2 puffs into the lungs 2 (two) times daily. Take 2 puffs first thing in am and then another 2 puffs about 12 hours later.) 1 Inhaler 11  . citalopram (CELEXA) 20 MG tablet Take 20 mg by mouth daily.    Marland Kitchen guaiFENesin (MUCINEX) 600 MG 12 hr tablet Take 1 tablet (600 mg total) by mouth 2 (two) times daily. (Patient not taking: Reported on 09/13/2014) 10 tablet 0  . ipratropium-albuterol (DUONEB) 0.5-2.5 (3) MG/3ML SOLN Take 3 mLs by nebulization every 6 (six) hours as needed (increased / severe wheezing , perisstent shortness of breath). 360 mL 0  . losartan (COZAAR) 50 MG tablet Take 1 tablet (50 mg total) by mouth daily. (Patient not taking: Reported on 09/13/2014) 30 tablet 0  . ondansetron (ZOFRAN) 4 MG tablet Take 1 tablet (4 mg total) by mouth every 6 (six) hours. 12 tablet 0  . oxyCODONE-acetaminophen (PERCOCET/ROXICET) 5-325 MG per tablet Take 1-2 tablets by mouth every 6 (six) hours as needed. 30 tablet 0   No current facility-administered medications for this visit.    Functional Status:  In your present state of health, do you have any difficulty performing the following activities: 06/09/2014  Is the patient deaf or have difficulty hearing? N  Hearing N  Vision N  Difficulty concentrating or making decisions N  Walking or climbing stairs? N  Doing  errands, shopping? N    Fall/Depression Screening:  No flowsheet data found.  Assessment:  CSW was able to meet with patient today for a routine home visit.  Patient was pleasant and cooperative during the visit.  When CSW arrived at patients home, patients Home Health Nurse with [name] was present and completing a discharge visit with patient.  CSW will notify patients RNCM with [name], [name] of [name]'s discharge visit with patient today.  CSW will also alert patients Primary Care Physician, Dr. Seward Mckinney, to ensure that Dr. Delfina Mckinney is aware that home health services are no longer being provided to patient. Patient appeared to be in good spirits today, but still "worrying about how I'm going to pay the bills".  Patient admits that his main objective at this point is to "get caught up on all outstanding debt, with the exception of medical bills, and move out of the home currently being rented by he and Dalton [name]".  CSW is aware that Dalton [name] is patients wife of 103 years.  Patient indicated that he would have already moved out of the home, had he had the money to pay for moving expenses.  Patient went on to say that he would like to get Dalton [name] set up in her own apartment, then he would move into a boarding house or rent a room from someone.  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   While present, CSW was able to review with patient the list of community agencies and resources that CSW provided to patient during the previous home visit.  CSW and patient were hopeful that this list of community agencies and resources may be able to assist with patients monthly expenses, as well as outstanding debt.  Patient admits that he has already borrowed money from church members, most family members and friends, and that he no longer feels  comfortable asking anyone for "additional handouts".  However, patient did make mention of an uncle that he is trying to "work out a deal with", and that he will know more once the uncle returns home from [place] in [place] next month. CSW explained to patient that CSW was able to make contact with a representative from all community agencies and resources on the list provided to patient by CSW, which included all of the following:  (1) Copywriter, advertising Intake and [place] through [place] ([place]). Scientific laboratory technician: [employer] / [phone] Special Assistance Division Director: Diona Browner / [phone]  (2) [employer] - [phone] [place] ([place]) Home Energy Assistance Program (HEAP) Heating Repair Replacement Program (HARRP) Free and [place] Programs  (3) [employer] - [phone]  (4) [employer] - [phone]  (5) [place] - [phone]  (6) [place] - [phone]  (7) [facility] [employer] [facility] - [phone]  (8) [facility] [place] - [phone] Debt Assistance Programs  CSW also explained to patient that CSW submitted letters of recommendation to all agencies requesting this information.  CSW inquired as to whether or not patient had received a return call from any of the above-named agencies, or even a letter in the mail, indicating that they would be willing to provide financial assistance.  Patient denied, admitting that he was "initially hopeful, but quickly became realistic".  Patient continues to remain optimistic, despite his shortcomings. CSW agreed to submit a Special Projects request to Giving Committee Members with [place] [name] to determine whether or not  patient would qualify for any type of financial assistance.  CSW will submit a Special Projects request within the next 48 hours, requesting a response by Wednesday, April 6th.  In the meantime, CSW has charged patient with the task of finding out the total amount of his outstanding debt, in addition to providing CSW with a quote from several moving companies.  Patient and CSW have agreed to converse on Wednesday, April 6th to report findings to one another.  Nat Christen, BSW, MSW, [place] [name] [place], [place], [place] [address] [email or web address] [phone]

## 2014-10-17 ENCOUNTER — Other Ambulatory Visit: Payer: Self-pay | Admitting: *Deleted

## 2014-10-17 NOTE — Patient Outreach (Signed)
Care Coordination:  Member called to follow up on home visit last month and to schedule routine home visit for April (member prefers to wait closer to the date to make appointments rather than make them in advance).  Member reports that he is doing well, denies any urgent concerns or questions.  He states that he was discharged from the home health agency last week, but is doing good.  Routine home visit scheduled for next week.

## 2014-10-22 ENCOUNTER — Other Ambulatory Visit: Payer: Self-pay | Admitting: *Deleted

## 2014-10-22 NOTE — Patient Outreach (Signed)
  [facility]) Care Management  [facility] Social Work  10/22/2014  KVION SHAPLEY Mar 20, 1934 [id number]    Current Medications:  Current Outpatient Prescriptions  Medication Sig Dispense Refill  . Albuterol Sulfate (PROAIR RESPICLICK) 027 (90 BASE) MCG/ACT AEPB Inhale 2 puffs into the lungs every 6 (six) hours as needed. 1 each 11  . ALPRAZolam (XANAX) 0.5 MG tablet Take 1 tablet (0.5 mg total) by mouth at bedtime as needed for anxiety. 30 tablet 0  . budesonide-formoterol (SYMBICORT) 160-4.5 MCG/ACT inhaler Take 2 puffs first thing in am and then another 2 puffs about 12 hours later. (Patient taking differently: Inhale 2 puffs into the lungs 2 (two) times daily. Take 2 puffs first thing in am and then another 2 puffs about 12 hours later.) 1 Inhaler 11  . citalopram (CELEXA) 20 MG tablet Take 20 mg by mouth daily.    Marland Kitchen guaiFENesin (MUCINEX) 600 MG 12 hr tablet Take 1 tablet (600 mg total) by mouth 2 (two) times daily. (Patient not taking: Reported on 09/13/2014) 10 tablet 0  . ipratropium-albuterol (DUONEB) 0.5-2.5 (3) MG/3ML SOLN Take 3 mLs by nebulization every 6 (six) hours as needed (increased / severe wheezing , perisstent shortness of breath). 360 mL 0  . losartan (COZAAR) 50 MG tablet Take 1 tablet (50 mg total) by mouth daily. (Patient not taking: Reported on 09/13/2014) 30 tablet 0  . ondansetron (ZOFRAN) 4 MG tablet Take 1 tablet (4 mg total) by mouth every 6 (six) hours. 12 tablet 0  . oxyCODONE-acetaminophen (PERCOCET/ROXICET) 5-325 MG per tablet Take 1-2 tablets by mouth every 6 (six) hours as needed. 30 tablet 0   No current facility-administered medications for this visit.    Functional Status:  In your present state of health, do you have any difficulty performing the following activities: 10/15/2014 06/09/2014  Is the patient deaf or have difficulty hearing? Y N  Hearing N N  Vision N N  Difficulty concentrating or making decisions Y N  Walking or climbing  stairs? N N  Doing errands, shopping? Y N  Preparing Food and eating ? Y -  Using the Toilet? N -  In the past six months, have you accidently leaked urine? N -  Do you have problems with loss of bowel control? N -  Managing your Medications? N -  Managing your Finances? Y -  Housekeeping or managing your Housekeeping? Y -    Fall/Depression Screening:  PHQ 2/9 Scores 10/15/2014  PHQ - 2 Score 1  PHQ- 9 Score 2  Exception Documentation Patient refusal    Assessment:   CSW made an attempt to contact patient today to follow-up regarding social work services and resources; however, patient was not available at the time of CSW's call.  CSW left a HIPAA compliant message for patient on voicemail, encouraging patient to return CSW's call at his earliest convenience.  In CSW's message to patient, CSW explained the reason for the call, as CSW wanted to obtain financial quotes from patient with regards to moving expenses.  Plan:   CSW will make a second attempt to contact patient to follow-up regarding social work services and resources on Friday, April 8th.  Nat Christen, BSW, MSW, [email or web address] [phone]

## 2014-10-24 ENCOUNTER — Ambulatory Visit: Payer: Commercial Managed Care - HMO | Admitting: *Deleted

## 2014-10-24 ENCOUNTER — Other Ambulatory Visit: Payer: Self-pay | Admitting: *Deleted

## 2014-10-24 ENCOUNTER — Encounter: Payer: Self-pay | Admitting: *Deleted

## 2014-10-24 NOTE — Patient Outreach (Signed)
[facility]) Care Management   10/24/2014  [name] June 23, 1934 [id number]  [name] is an 81 y.o. male  Subjective:   Member reports that he has been "doing good.  I've been able to get out more.  I take my oxygen with me, and I just go walk around the store sometimes."  Member states that his main concern continues to be his financial situation.  Objective:   Review of Systems  Gastrointestinal: Positive for nausea.       Reports nausea almost every morning, but states it goes away after being up for a while    Physical Exam  Constitutional: He is oriented to person, place, and time. He appears well-developed and well-nourished.  Neck: Normal range of motion.  Cardiovascular: Normal rate, regular rhythm and normal heart sounds.   Respiratory: Effort normal and breath sounds normal.  decreased  GI: Soft. Bowel sounds are normal.  Musculoskeletal: Normal range of motion.  Neurological: He is alert and oriented to person, place, and time.  Skin: Skin is warm and dry.    BP 120/70 mmHg  Pulse 80  Resp 18  Ht 1.803 m ($Remove'5\' 11"'MGHKPbU$ )  Wt 175 lb (79.379 kg)  BMI 24.42 kg/m2  SpO2 97%  Current Medications:   Current Outpatient Prescriptions  Medication Sig Dispense Refill  . Albuterol Sulfate (PROAIR RESPICLICK) 478 (90 BASE) MCG/ACT AEPB Inhale 2 puffs into the lungs every 6 (six) hours as needed. 1 each 11  . ALPRAZolam (XANAX) 0.5 MG tablet Take 1 tablet (0.5 mg total) by mouth at bedtime as needed for anxiety. 30 tablet 0  . budesonide-formoterol (SYMBICORT) 160-4.5 MCG/ACT inhaler Take 2 puffs first thing in am and then another 2 puffs about 12 hours later. (Patient taking differently: Inhale 2 puffs into the lungs 2 (two) times daily. Take 2 puffs first thing in am and then another 2 puffs about 12 hours later.) 1 Inhaler 11  . citalopram (CELEXA) 20 MG tablet Take 20 mg by mouth daily.    Marland Kitchen ipratropium-albuterol (DUONEB) 0.5-2.5 (3) MG/3ML SOLN  Take 3 mLs by nebulization every 6 (six) hours as needed (increased / severe wheezing , perisstent shortness of breath). 360 mL 0  . oxyCODONE-acetaminophen (PERCOCET/ROXICET) 5-325 MG per tablet Take 1-2 tablets by mouth every 6 (six) hours as needed. 30 tablet 0  . guaiFENesin (MUCINEX) 600 MG 12 hr tablet Take 1 tablet (600 mg total) by mouth 2 (two) times daily. (Patient not taking: Reported on 09/13/2014) 10 tablet 0  . losartan (COZAAR) 50 MG tablet Take 1 tablet (50 mg total) by mouth daily. (Patient not taking: Reported on 09/13/2014) 30 tablet 0  . ondansetron (ZOFRAN) 4 MG tablet Take 1 tablet (4 mg total) by mouth every 6 (six) hours. (Patient not taking: Reported on 10/24/2014) 12 tablet 0   No current facility-administered medications for this visit.    Functional Status:   In your present state of health, do you have any difficulty performing the following activities: 10/15/2014 06/09/2014  Hearing? Y N  Vision? N N  Difficulty concentrating or making decisions? N N  Walking or climbing stairs? Y N  Dressing or bathing? N N  Doing errands, shopping? Y N  Preparing Food and eating ? Y -  Using the Toilet? N -  In the past six months, have you accidently leaked urine? N -  Do you have problems with loss of bowel control? N -  Managing your Medications? N -  Managing your Finances? Y -  Housekeeping or managing your Housekeeping? Y -    Fall/Depression Screening:    PHQ 2/9 Scores 10/15/2014  PHQ - 2 Score 1  PHQ- 9 Score 2  Exception Documentation Patient refusal    Assessment:    Met with member as scheduled.  Member reports that since the last visit, he has passed his kidney stones and is no longer having any pain from that.  He does report that he has some chronic pain for which he still takes the Oxycodone for, but reports his pain level to be 0 today.  He also reports that his program with [facility] has been complete.  He states that he has completed his  physical and occupational therapy, and has seen much improvement.  Although he reports that he has some days where his breathing is more difficult than others, he states that he does good on most days.  The progression of COPD discussed, member states that he knows his lungs won't get any better, but feels that he is doing as well as he can right now.  Member reports that he has been suffering from nausea nearly every morning for he past several weeks.  He states that he has Zofran ordered, but has been unable to refill the prescription due to the cost.  Member also reports that he has stopped taking his nexium due to the cost.  Member advised to make an appointment with primary physician to discuss other options that may be more cost effective. Member reports that he sleeps on his right side.  Benefits of sleeping on left side versus the right side discussed, encouraged to try sleeping on left side to decrease the chances of acid buildup.  Member verbalizes understanding.    Member states that he is continuing to gather information for Education officer, museum, J. Saporito, for [employer].  He states that he feel if his financial situation was taken care of then he would not worry so much about his medical problems.  Member assured that Mrs. Saporito was working effortlessly to provide him with resources.    Member denies any further questions or concerns.  In order to keep better track of appointments, member request to have next home visit scheduled either the week of or the week before.  Encouraged to contact this care manager with any concerns.  Plan:   Will call member to schedule routine home visit for next month.  [facility] CM Care Plan        Patient Outreach from 10/24/2014 in [place] Problem Two  Recent emergency department visit with diagnosis of kidney stones   Care Plan for Problem Two  Active   [facility] Long Term Goal (31-90) days  member will not be seen in  the emergency department for the next 31 days   [facility] Long Term Goal Start Date  09/18/14   [facility] Long Term Goal Met Date  10/20/14   [facility] CM Short Term Goal #1 (0-30 days)  member will report being pain free from kidney stones within the next 3-4 weeks    [facility] CM Short Term Goal #1 Start Date  09/18/14   [facility] CM Short Term Goal #1 Met Date   10/24/14   [facility] CM Short Term Goal #2 (0-30 days)  member will contact Dr. Delfina Redwood to have follow up appointment within the next 4 weeks   [facility] CM Short Term Goal #2 Start Date  09/18/14   [facility] CM Short Term Goal #2 Met Date  -- [Goal not met, will re-address]   Care Plan Problem Three  Nausea   Care Plan for Problem Three  Active   [facility] CM Short Term Goal #1 (0-30 days)  Member will make appointment with Dr. Delfina Redwood within the next 4 weeks   [facility] CM Short Term Goal #1 Start Date  10/24/14   [facility] CM Short Term Goal #2 (0-30 days)  Member will report taking medications as prescribed for the next 4 weeks   [facility] CM Short Term Goal #2 Start Date  10/24/14      [name], BSN, [place] Manager [phone]

## 2014-10-24 NOTE — Patient Outreach (Signed)
[facility]) Care Management  [facility] Social Work  10/24/2014  TACUMA GRAFFAM 05-Oct-1933 [id number]    Current Medications:  Current Outpatient Prescriptions  Medication Sig Dispense Refill  . Albuterol Sulfate (PROAIR RESPICLICK) 035 (90 BASE) MCG/ACT AEPB Inhale 2 puffs into the lungs every 6 (six) hours as needed. 1 each 11  . ALPRAZolam (XANAX) 0.5 MG tablet Take 1 tablet (0.5 mg total) by mouth at bedtime as needed for anxiety. 30 tablet 0  . budesonide-formoterol (SYMBICORT) 160-4.5 MCG/ACT inhaler Take 2 puffs first thing in am and then another 2 puffs about 12 hours later. (Patient taking differently: Inhale 2 puffs into the lungs 2 (two) times daily. Take 2 puffs first thing in am and then another 2 puffs about 12 hours later.) 1 Inhaler 11  . citalopram (CELEXA) 20 MG tablet Take 20 mg by mouth daily.    Marland Kitchen guaiFENesin (MUCINEX) 600 MG 12 hr tablet Take 1 tablet (600 mg total) by mouth 2 (two) times daily. (Patient not taking: Reported on 09/13/2014) 10 tablet 0  . ipratropium-albuterol (DUONEB) 0.5-2.5 (3) MG/3ML SOLN Take 3 mLs by nebulization every 6 (six) hours as needed (increased / severe wheezing , perisstent shortness of breath). 360 mL 0  . losartan (COZAAR) 50 MG tablet Take 1 tablet (50 mg total) by mouth daily. (Patient not taking: Reported on 09/13/2014) 30 tablet 0  . ondansetron (ZOFRAN) 4 MG tablet Take 1 tablet (4 mg total) by mouth every 6 (six) hours. 12 tablet 0  . oxyCODONE-acetaminophen (PERCOCET/ROXICET) 5-325 MG per tablet Take 1-2 tablets by mouth every 6 (six) hours as needed. 30 tablet 0   No current facility-administered medications for this visit.    Functional Status:  In your present state of health, do you have any difficulty performing the following activities: 10/15/2014 06/09/2014  Hearing? Y N  Vision? N N  Difficulty concentrating or making decisions? N N  Walking or climbing stairs? Y N  Dressing or bathing? N N  Doing  errands, shopping? Y N  Preparing Food and eating ? Y -  Using the Toilet? N -  In the past six months, have you accidently leaked urine? N -  Do you have problems with loss of bowel control? N -  Managing your Medications? N -  Managing your Finances? Y -  Housekeeping or managing your Housekeeping? Y -    Fall/Depression Screening:  PHQ 2/9 Scores 10/15/2014  PHQ - 2 Score 1  PHQ- 9 Score 2  Exception Documentation Patient refusal    Assessment:  CSW was able to make phone contact with patient today to follow-up regarding social work services and resources.  The purpose of CSW's call today was to follow-up with patient regarding obtaining financial quotes for moving expenses.  CSW had previously explained to patient that CSW would like to assist patient with funds to improve his quality of life and prevent hospital admissions.  f from, what patient referred to as "An intolerable situation".  CSW is aware that patients current medical conditions are terminal and incurable. Under no circumstances, did CSW agree to pay for "said expenses".  However, CSW explained to patient that CSW would really like to be able to offer financial assistance, based on patients current situation,  these expenses or agree to [employer]  These funds would be allocated by Giving Committee Members with [place] [employer].  A faithful husband of 11 years would love to believe that his wife,  who never had to work a day in her life, would be willing to stand by her man.     will submit a Special Projects request to Giving Committee members with [place] Management, to request financial assistance to help patient with moving expenses.  Patient has agreed to type up an expense sheet to submit directly to CSW for review and submission to Giving Committee members.  CSW will contact patient on Wednesday, April 20th, upon CSW's return to the office from vacation.  Nat Christen, BSW, MSW, [place] [facility] [place], [place], [place] [address] [email or web address] [phone]

## 2014-10-29 ENCOUNTER — Other Ambulatory Visit: Payer: Self-pay | Admitting: *Deleted

## 2014-10-29 NOTE — Patient Outreach (Signed)
Member called this care manager to request more information about social worker, J. Saporito.  He states that he has been attempting to send her the requested information for community resources program but has been unable to get the information to go through.  Member states that he realized that he as spelling the last name incorrectly in the email address and has now corrected it.  Denies any further questions at this time.  [name], BSN, [place] Management  [facility] Care Manager [phone]

## 2014-11-05 ENCOUNTER — Other Ambulatory Visit: Payer: Self-pay | Admitting: *Deleted

## 2014-11-06 ENCOUNTER — Encounter: Payer: Self-pay | Admitting: *Deleted

## 2014-11-06 NOTE — Patient Outreach (Signed)
[facility]) Care Management  [facility] Social Work  11/06/2014  [name] 04-Aug-1933 [id number]   Current Medications:  Current Outpatient Prescriptions  Medication Sig Dispense Refill  . Albuterol Sulfate (PROAIR RESPICLICK) 431 (90 BASE) MCG/ACT AEPB Inhale 2 puffs into the lungs every 6 (six) hours as needed. 1 each 11  . ALPRAZolam (XANAX) 0.5 MG tablet Take 1 tablet (0.5 mg total) by mouth at bedtime as needed for anxiety. 30 tablet 0  . budesonide-formoterol (SYMBICORT) 160-4.5 MCG/ACT inhaler Take 2 puffs first thing in am and then another 2 puffs about 12 hours later. (Patient taking differently: Inhale 2 puffs into the lungs 2 (two) times daily. Take 2 puffs first thing in am and then another 2 puffs about 12 hours later.) 1 Inhaler 11  . citalopram (CELEXA) 20 MG tablet Take 20 mg by mouth daily.    Marland Kitchen guaiFENesin (MUCINEX) 600 MG 12 hr tablet Take 1 tablet (600 mg total) by mouth 2 (two) times daily. (Patient not taking: Reported on 09/13/2014) 10 tablet 0  . ipratropium-albuterol (DUONEB) 0.5-2.5 (3) MG/3ML SOLN Take 3 mLs by nebulization every 6 (six) hours as needed (increased / severe wheezing , perisstent shortness of breath). 360 mL 0  . losartan (COZAAR) 50 MG tablet Take 1 tablet (50 mg total) by mouth daily. (Patient not taking: Reported on 09/13/2014) 30 tablet 0  . ondansetron (ZOFRAN) 4 MG tablet Take 1 tablet (4 mg total) by mouth every 6 (six) hours. (Patient not taking: Reported on 10/24/2014) 12 tablet 0  . oxyCODONE-acetaminophen (PERCOCET/ROXICET) 5-325 MG per tablet Take 1-2 tablets by mouth every 6 (six) hours as needed. 30 tablet 0   No current facility-administered medications for this visit.    Functional Status:  In your present state of health, do you have any difficulty performing the following activities: 10/15/2014 06/09/2014  Hearing? Y N  Vision? N N  Difficulty concentrating or making decisions? N N  Walking or climbing stairs? Y N   Dressing or bathing? N N  Doing errands, shopping? Y N  Preparing Food and eating ? Y -  Using the Toilet? N -  In the past six months, have you accidently leaked urine? N -  Do you have problems with loss of bowel control? N -  Managing your Medications? N -  Managing your Finances? Y -  Housekeeping or managing your Housekeeping? Y -    Fall/Depression Screening:  PHQ 2/9 Scores 10/15/2014  PHQ - 2 Score 1  PHQ- 9 Score 2  Exception Documentation Patient refusal    Assessment:   CSW was able to make contact with patient today to follow-up with him regarding his request for financial assistance.  Patient submitted a list of all his outstanding debt, bills, medical expenses, etc. to CSW for review.  Patient also submitted to CSW a list of estimates/quotes he received from several moving companies.  Patient is requesting financial assistance to help pay outstanding debt, as well as a portion of his moving expenses.  Below is the breakdown of expenses submitted to [place] by the patient:  Environmental consultant $2310.00  [employer] $402.54  [place] $120.00  Water $30.00 [employer] $129.00 [employer] $208.00 Auto Gas $50.00  Auto and Health Insurance $99.00 Rent $1420.00  Yard Maintenance $50.00  Food $400.00  [employer] $145.00  Personal Care Services $50.00  Emergency Medical Services $250.00  Medications $144.00  Primary Care Physician Co-pays $10.00  Specialist Visit Co-pays $45.00  Specialist Visit Amount Owed $135.00  Hospital $95.00, $68.20, and more  [place] $114.06  [facility] $80.00  NEED CATARACT SURGERY $195.00 (Each Eye)  Now that the appropriate information has been received, CSW will submit a Special Projects request to all Giving Committee Members to request financial assistance through [place] [employer].  Patient is aware that the process can take up to a month  because all Giving Committee Members must be in agreement with providing financial assistance and then it must be approved by Royann Shivers, Director of [employer], as well as Dr. Jenell Milliner.  Patient voiced understanding and was agreeable to this plan.  CSW agreed to follow-up with patient as soon as information is received regarding eligibility.  Plan:   CSW will submit a Special Projects request to all Giving Committee Members with [place] Management, requesting financial assistance for patient.

## 2014-11-07 DIAGNOSIS — J449 Chronic obstructive pulmonary disease, unspecified: Secondary | ICD-10-CM | POA: Diagnosis not present

## 2014-11-12 ENCOUNTER — Encounter: Payer: Self-pay | Admitting: *Deleted

## 2014-11-12 ENCOUNTER — Ambulatory Visit: Payer: Commercial Managed Care - HMO | Admitting: *Deleted

## 2014-11-12 ENCOUNTER — Other Ambulatory Visit: Payer: Self-pay | Admitting: *Deleted

## 2014-11-12 NOTE — Patient Outreach (Signed)
[facility]) Care Management  [facility] Social Work  11/12/2014  [name] 07-01-1934 [id number]  Subjective:    Lack of financial resources to pay monthly expenses, moving expenses and medical expenses.  Current Medications:  Current Outpatient Prescriptions  Medication Sig Dispense Refill  . Albuterol Sulfate (PROAIR RESPICLICK) 939 (90 BASE) MCG/ACT AEPB Inhale 2 puffs into the lungs every 6 (six) hours as needed. 1 each 11  . ALPRAZolam (XANAX) 0.5 MG tablet Take 1 tablet (0.5 mg total) by mouth at bedtime as needed for anxiety. 30 tablet 0  . budesonide-formoterol (SYMBICORT) 160-4.5 MCG/ACT inhaler Take 2 puffs first thing in am and then another 2 puffs about 12 hours later. (Patient taking differently: Inhale 2 puffs into the lungs 2 (two) times daily. Take 2 puffs first thing in am and then another 2 puffs about 12 hours later.) 1 Inhaler 11  . citalopram (CELEXA) 20 MG tablet Take 20 mg by mouth daily.    Marland Kitchen guaiFENesin (MUCINEX) 600 MG 12 hr tablet Take 1 tablet (600 mg total) by mouth 2 (two) times daily. (Patient not taking: Reported on 09/13/2014) 10 tablet 0  . ipratropium-albuterol (DUONEB) 0.5-2.5 (3) MG/3ML SOLN Take 3 mLs by nebulization every 6 (six) hours as needed (increased / severe wheezing , perisstent shortness of breath). 360 mL 0  . losartan (COZAAR) 50 MG tablet Take 1 tablet (50 mg total) by mouth daily. (Patient not taking: Reported on 09/13/2014) 30 tablet 0  . ondansetron (ZOFRAN) 4 MG tablet Take 1 tablet (4 mg total) by mouth every 6 (six) hours. (Patient not taking: Reported on 10/24/2014) 12 tablet 0  . oxyCODONE-acetaminophen (PERCOCET/ROXICET) 5-325 MG per tablet Take 1-2 tablets by mouth every 6 (six) hours as needed. 30 tablet 0   No current facility-administered medications for this visit.    Functional Status:  In your present state of health, do you have any difficulty performing the following activities: 11/12/2014 11/10/2014   Hearing? Y N  Vision? N N  Difficulty concentrating or making decisions? N N  Walking or climbing stairs? Y N  Dressing or bathing? N N  Doing errands, shopping? N Y  Conservation officer, nature and eating ? Y -  Using the Toilet? N -  In the past six months, have you accidently leaked urine? N -  Do you have problems with loss of bowel control? N -  Managing your Medications? N -  Managing your Finances? Y -  Housekeeping or managing your Housekeeping? Y -    Fall/Depression Screening:  PHQ 2/9 Scores 11/12/2014 10/15/2014  PHQ - 2 Score 0 1  PHQ- 9 Score - 2  Exception Documentation - Patient refusal    Assessment:   CSW was able to make contact with patient today to follow-up regarding patient's need for financial assistance to help pay medical expenses, monthly expenses, moving expenses, etc.  CSW obtained two HIPAA compliant identifiers from patient, which included his name and date of birth, prior to proceeding with the phone conversation.  CSW explained to patient that [place] has submitted a Special Projects request to Giving Committee Members with [place], and is still awaiting a response regarding approval/denial.  CSW has scheduled a meeting with all Giving Committee Members for Friday, May 6th at 9:30am to discuss further.  In the meantime, CSW inquired as to whether or not patient and his wife, [name] have considered having a yard sale, to get rid of any unwanted items, assist with  downsizing, as well as potentially make some extra money to pay for moving expenses.  Patient agreed to talk it over with Mrs. Marinaccio and get back to CSW at his earliest convenience, as CSW has agreed to assist with the set up, sale and clean up.  CSW agreed to follow-up with patient on Friday, May 6th, after the Giving Committee Meeting, to report findings.  Patient voiced understanding and was agreeable to this plan.  Plan:   CSW will contact patient on Friday, May 6th to report  findings of meeting with Giving Committee Members regarding obtaining financial assistance to help pay patients medical expenses, monthly expenses and moving expenses.  Nat Christen, BSW, MSW, [place] [facility] [place], [employer], [place] [address] [email or web address] [phone]

## 2014-11-21 ENCOUNTER — Other Ambulatory Visit: Payer: Self-pay | Admitting: *Deleted

## 2014-11-21 NOTE — Patient Outreach (Signed)
Call placed to member to schedule routine home visit. (Member has stated several times previously that he prefers to have the appointment set up either the week before or the week of the appointment).  No answer, HIPPA compliant voice message left.  Will await call back.  Will call back next week.  [name], BSN, [place] Management  [facility] Care Manager [phone]

## 2014-11-24 ENCOUNTER — Encounter: Payer: Self-pay | Admitting: *Deleted

## 2014-11-24 NOTE — Patient Outreach (Signed)
[facility]) Care Management  [facility] Social Work  11/24/2014  WILLEM KLINGENSMITH 10-21-1933 [id number]  Subjective:    Objective:   Current Medications:  Current Outpatient Prescriptions  Medication Sig Dispense Refill  . Albuterol Sulfate (PROAIR RESPICLICK) 867 (90 BASE) MCG/ACT AEPB Inhale 2 puffs into the lungs every 6 (six) hours as needed. 1 each 11  . ALPRAZolam (XANAX) 0.5 MG tablet Take 1 tablet (0.5 mg total) by mouth at bedtime as needed for anxiety. 30 tablet 0  . budesonide-formoterol (SYMBICORT) 160-4.5 MCG/ACT inhaler Take 2 puffs first thing in am and then another 2 puffs about 12 hours later. (Patient taking differently: Inhale 2 puffs into the lungs 2 (two) times daily. Take 2 puffs first thing in am and then another 2 puffs about 12 hours later.) 1 Inhaler 11  . citalopram (CELEXA) 20 MG tablet Take 20 mg by mouth daily.    Marland Kitchen guaiFENesin (MUCINEX) 600 MG 12 hr tablet Take 1 tablet (600 mg total) by mouth 2 (two) times daily. (Patient not taking: Reported on 09/13/2014) 10 tablet 0  . ipratropium-albuterol (DUONEB) 0.5-2.5 (3) MG/3ML SOLN Take 3 mLs by nebulization every 6 (six) hours as needed (increased / severe wheezing , perisstent shortness of breath). 360 mL 0  . losartan (COZAAR) 50 MG tablet Take 1 tablet (50 mg total) by mouth daily. (Patient not taking: Reported on 09/13/2014) 30 tablet 0  . ondansetron (ZOFRAN) 4 MG tablet Take 1 tablet (4 mg total) by mouth every 6 (six) hours. (Patient not taking: Reported on 10/24/2014) 12 tablet 0  . oxyCODONE-acetaminophen (PERCOCET/ROXICET) 5-325 MG per tablet Take 1-2 tablets by mouth every 6 (six) hours as needed. 30 tablet 0   No current facility-administered medications for this visit.    Functional Status:  In your present state of health, do you have any difficulty performing the following activities: 11/12/2014 11/10/2014  Hearing? Y N  Vision? N N  Difficulty concentrating or making decisions? N N   Walking or climbing stairs? Y N  Dressing or bathing? N N  Doing errands, shopping? N Y  Conservation officer, nature and eating ? Y -  Using the Toilet? N -  In the past six months, have you accidently leaked urine? N -  Do you have problems with loss of bowel control? N -  Managing your Medications? N -  Managing your Finances? Y -  Housekeeping or managing your Housekeeping? Y -    Fall/Depression Screening:  PHQ 2/9 Scores 11/24/2014 11/12/2014 10/15/2014  PHQ - 2 Score 0 0 1  PHQ- 9 Score - - 2  Exception Documentation - - Patient refusal    Assessment:   CSW was able to meet with patient today for a routine home visit to discuss the possibility of patient having a yard sale to eliminate household items, as well as earn some extra money for moving expenses.  Patient appeared to be in good spirits today, most appreciative of CSW's willingness to help with his current financial deficit.  Patient reported that he and his wife, Marilyn Wing have already had a yard sale, selling all but the essentials required for his basic necessities.  Patient reported that his main goal at this point in his life is to be able to live independently, providing his wife with the 2 of his income.    Plan:  CSW will contact patient on Tuesday, May 10th to follow-up with patient regarding his conversation with wife and family regarding patient moving  into a one-bedroom apartment alone.

## 2014-11-26 ENCOUNTER — Encounter: Payer: Self-pay | Admitting: *Deleted

## 2014-11-26 ENCOUNTER — Other Ambulatory Visit: Payer: Self-pay | Admitting: *Deleted

## 2014-11-26 NOTE — Patient Outreach (Addendum)
[facility]) Care Management  [facility] Social Work  11/26/2014  JACOBEY GURA Aug 09, 1933 [id number]  Subjective:    Objective:   Current Medications:  Current Outpatient Prescriptions  Medication Sig Dispense Refill  . Albuterol Sulfate (PROAIR RESPICLICK) 409 (90 BASE) MCG/ACT AEPB Inhale 2 puffs into the lungs every 6 (six) hours as needed. 1 each 11  . ALPRAZolam (XANAX) 0.5 MG tablet Take 1 tablet (0.5 mg total) by mouth at bedtime as needed for anxiety. 30 tablet 0  . budesonide-formoterol (SYMBICORT) 160-4.5 MCG/ACT inhaler Take 2 puffs first thing in am and then another 2 puffs about 12 hours later. (Patient taking differently: Inhale 2 puffs into the lungs 2 (two) times daily. Take 2 puffs first thing in am and then another 2 puffs about 12 hours later.) 1 Inhaler 11  . citalopram (CELEXA) 20 MG tablet Take 20 mg by mouth daily.    Marland Kitchen guaiFENesin (MUCINEX) 600 MG 12 hr tablet Take 1 tablet (600 mg total) by mouth 2 (two) times daily. (Patient not taking: Reported on 09/13/2014) 10 tablet 0  . ipratropium-albuterol (DUONEB) 0.5-2.5 (3) MG/3ML SOLN Take 3 mLs by nebulization every 6 (six) hours as needed (increased / severe wheezing , perisstent shortness of breath). 360 mL 0  . losartan (COZAAR) 50 MG tablet Take 1 tablet (50 mg total) by mouth daily. (Patient not taking: Reported on 09/13/2014) 30 tablet 0  . ondansetron (ZOFRAN) 4 MG tablet Take 1 tablet (4 mg total) by mouth every 6 (six) hours. (Patient not taking: Reported on 10/24/2014) 12 tablet 0  . oxyCODONE-acetaminophen (PERCOCET/ROXICET) 5-325 MG per tablet Take 1-2 tablets by mouth every 6 (six) hours as needed. 30 tablet 0   No current facility-administered medications for this visit.    Functional Status:  In your present state of health, do you have any difficulty performing the following activities: 11/26/2014 11/12/2014  Hearing? [name]  Vision? N N  Difficulty concentrating or making decisions? N N   Walking or climbing stairs? Y Y  Dressing or bathing? N N  Doing errands, shopping? Y N  Preparing Food and eating ? Y Y  Using the Toilet? N N  In the past six months, have you accidently leaked urine? N N  Do you have problems with loss of bowel control? N N  Managing your Medications? N N  Managing your Finances? [name]  Housekeeping or managing your Housekeeping? [name]    Fall/Depression Screening:  PHQ 2/9 Scores 11/26/2014 11/24/2014 11/12/2014 10/15/2014  PHQ - 2 Score 0 0 0 1  PHQ- 9 Score - - - 2  Exception Documentation - - - Patient refusal    Assessment:   CSW was able to make contact with patient today to follow-up regarding possible placement into a low-income, ground level, one-bedroom apartment for him to move into to live independently.  When CSW last spoke with patient, on Friday, May 6th, CSW charged patient with the task of talking with his wife and two children about him moving out of his current home and into a place of his own, to live out the rest of his life.  Patient was able to have this conversation with his family; however, there were provisions made and stipulations with regards to patient's [place].  Patient indicated that the majority of his Social Security check, which is less than $1,500.00 per month, would need to go to his wife, Britian Jentz, as she does not have any source  of income and unable to support herself financially.  Patient indicated that CSW would need to locate him a place that charges less than $300.00 per month for rent.  Patient believes that he would be able to live off of a total of $500.00 per month, providing Mrs. Halseth with a little less than $1,000.00 per month.  CSW reminded patient that he needed money for food, utilities, medications, co-pays, doctor visits, etc., inquiring as to whether or not it would actually be realistic for patient to live off of $500.00 per month. Patient reported, "I don't have a choice, I'll have to make  it work". Patient's children have agreed to allow Mrs. Hindle to live with one of them until she is able to move out of the house they are currently renting and into affordable housing of her own.  Mrs. Mendibles is aware that she will be responsible for selling all the unwanted and unused items in the house and moving the remaining items into storage.  Patient only plans to take the essentials with him when he moves, which would basically just include his bedroom furniture, pots, pans, dishes, towels, etc.  Everything in their current home was bought by the patient, as Mrs. Ballweg never worked, but patient does not care about materialistic or sentimental items, wishing to eliminate all the current stress and debt, "during the last phases of his life".  CSW agreed to assist patient with this process.  CSW was able to provide patient with a few resources to contact regarding rent and availability.  In the meantime, CSW agreed to contact the Department of [place] and Wellness to inquire about temporary placement for patient into the [facility].  In addition, CSW will contact the [place] to determine whether or not there are any funds available to place patient into an Extended Stay [facility] for several months. CSW will continue to look into low-income, ground level, one bedroom apartments, boarding houses or possibly having patient rent a room, while awaiting a response from the above named resources.  CSW will also converse with Royann Shivers, Director of [place] Management to see if any funds are available to financially support patient for a limited period of time through the [employer].  CSW will follow-up with patient as soon as any progress is made, to report findings.  No additional social work needs have been identified at this time.    Plan:   CSW will follow-up with patient in one week, on Wednesday, May 18th, to report findings of  conversations with the Department of [place] and Wellness, with the [place] and Royann Shivers, [place] manager Care Management, all with the [employer]. CSW will fax a correspondence letter to patient's Primary Care Physician, Dr. Seward Carol to ensure that Dr. Delfina Redwood is aware of CSW's involvement with patient.  Nat Christen, BSW, MSW, [place] Management [place], [place], [place] [address] [email or web address] [phone]

## 2014-11-27 ENCOUNTER — Encounter: Payer: Self-pay | Admitting: *Deleted

## 2014-12-02 ENCOUNTER — Other Ambulatory Visit: Payer: Self-pay | Admitting: *Deleted

## 2014-12-02 ENCOUNTER — Encounter: Payer: Self-pay | Admitting: *Deleted

## 2014-12-02 NOTE — Patient Outreach (Signed)
[facility]) Care Management   12/02/2014  [name] 1933-12-14 [id number]  [name] is an 81 y.o. male  Subjective:   Objective:   Review of Systems  Constitutional: Negative.   HENT: Negative.   Eyes: Negative.   Respiratory: Negative.   Cardiovascular: Negative.   Gastrointestinal: Positive for nausea.       Continues to report nausea in the mornings   Genitourinary: Negative.   Musculoskeletal: Negative.   Skin: Negative.   Neurological: Negative.   Endo/Heme/Allergies: Negative.   Psychiatric/Behavioral: Positive for memory loss.       Member will discuss memory loss with primary care physician    Physical Exam  Constitutional: He is oriented to person, place, and time. He appears well-developed.  Neck: Normal range of motion.  Cardiovascular: Normal rate, regular rhythm and normal heart sounds.   Respiratory: Effort normal and breath sounds normal.  GI: Soft. Bowel sounds are normal.  Musculoskeletal: Normal range of motion.  Neurological: He is alert and oriented to person, place, and time.  Skin: Skin is warm and dry.   BP 120/72 mmHg  Pulse 67  Resp 18  Wt 175 lb (79.379 kg)  SpO2 96%  Current Medications:   Current Outpatient Prescriptions  Medication Sig Dispense Refill  . Albuterol Sulfate (PROAIR RESPICLICK) 627 (90 BASE) MCG/ACT AEPB Inhale 2 puffs into the lungs every 6 (six) hours as needed. 1 each 11  . ALPRAZolam (XANAX) 0.5 MG tablet Take 1 tablet (0.5 mg total) by mouth at bedtime as needed for anxiety. 30 tablet 0  . budesonide-formoterol (SYMBICORT) 160-4.5 MCG/ACT inhaler Take 2 puffs first thing in am and then another 2 puffs about 12 hours later. (Patient taking differently: Inhale 2 puffs into the lungs 2 (two) times daily. Take 2 puffs first thing in am and then another 2 puffs about 12 hours later.) 1 Inhaler 11  . citalopram (CELEXA) 20 MG tablet Take 20 mg by mouth daily.    Marland Kitchen ipratropium-albuterol  (DUONEB) 0.5-2.5 (3) MG/3ML SOLN Take 3 mLs by nebulization every 6 (six) hours as needed (increased / severe wheezing , perisstent shortness of breath). 360 mL 0  . oxyCODONE-acetaminophen (PERCOCET/ROXICET) 5-325 MG per tablet Take 1-2 tablets by mouth every 6 (six) hours as needed. 30 tablet 0  . guaiFENesin (MUCINEX) 600 MG 12 hr tablet Take 1 tablet (600 mg total) by mouth 2 (two) times daily. (Patient not taking: Reported on 09/13/2014) 10 tablet 0  . losartan (COZAAR) 50 MG tablet Take 1 tablet (50 mg total) by mouth daily. (Patient not taking: Reported on 09/13/2014) 30 tablet 0  . ondansetron (ZOFRAN) 4 MG tablet Take 1 tablet (4 mg total) by mouth every 6 (six) hours. (Patient not taking: Reported on 10/24/2014) 12 tablet 0   No current facility-administered medications for this visit.    Functional Status:   In your present state of health, do you have any difficulty performing the following activities: 11/26/2014 11/12/2014  Hearing? [name]  Vision? N N  Difficulty concentrating or making decisions? N N  Walking or climbing stairs? Y Y  Dressing or bathing? N N  Doing errands, shopping? Y N  Preparing Food and eating ? Y Y  Using the Toilet? N N  In the past six months, have you accidently leaked urine? N N  Do you have problems with loss of bowel control? N N  Managing your Medications? N N  Managing your Finances? [name]  Housekeeping  or managing your Housekeeping? [name]    Fall/Depression Screening:    PHQ 2/9 Scores 12/02/2014 11/26/2014 11/24/2014 11/12/2014 10/15/2014  PHQ - 2 Score 0 0 0 0 1  PHQ- 9 Score - - - - 2  Exception Documentation - - - - Patient refusal    Assessment:    Member states that he has been doing fine, has been able to get out more often alone, taking his portable oxygen tank with him.  He reports that he tries to increase his exercise tolerance as much as possible, but still remain unable to tolerate much.  Discussed the progression of his COPD, member  verbalizes understanding.  Member states that he has an appointment with Dr. Delfina Redwood for next week, and that he will call to schedule an appointment with Dr. Melvyn Novas for a follow up within the next couple weeks.  He reports that he will discuss his recent memory loss (starting to lose his short term memory more often) and nausea.  Member has been on Nexium in the past, but is unable to afford medication.  Will speak with Dr. Delfina Redwood about other affordable options.  Member denies further questions.  Encouraged to contact this care manager with any concerns.  Plan:   Routine home visit scheduled for next month.  [facility] CM Care Plan Problem One        Patient Outreach from 11/21/2014 in [place] Problem One  Lack of financial resources to pay moving expenses.   Care Plan for Problem One  Active   [facility] CM Short Term Goal #1 (0-30 days)  Patient will have conversation with family regarding yard sale within the next week.   [facility] CM Short Term Goal #1 Start Date  11/12/14   [facility] CM Short Term Goal #1 Met Date  11/21/14   Interventions for Short Term Goal #1  CSW will meet with Giving Committee Members in one week to discuss Special Projects request for charity funding.    [facility] CM Care Plan Problem Two        Patient Outreach Telephone from 11/26/2014 in [place] Problem Two  Patient will talk with wife and family about moving into a one-bedroom apartment alone, within the next week.   Care Plan for Problem Two  Active   [facility] CM Short Term Goal #1 (0-30 days)  CSW will contact patient in one week to discuss verdict of conversation with wife and children regarding patient moving into a one-bedroom apartment alone.   [facility] CM Short Term Goal #1 Start Date  11/21/14   [facility] CM Short Term Goal #1 Met Date   11/26/14   [facility] CM Short Term Goal #2 (0-30 days)  CSW will assist patient with locating low income, one-bedroom apartments on the ground floor that patient can  move into alone.    [place] Problem Three        Patient Outreach from 12/02/2014 in [place] Problem Three  Nausea   Care Plan for Problem Three  Active   [facility] Long Term Goal (31-90) days  Member will report the nause issue as being resolved within the next 31 days   [facility] Long Term Goal Start Date  12/02/14   Interventions for Problem Three Long Term Goal  Instructed to speak with primary care physician and seek affordable medicaiton options.   [facility] CM Short Term Goal #1 (0-30 days)  Member will make appointment with Dr. Delfina Redwood within the next 4 weeks   [facility] CM Short Term Goal #1 Start Date  10/24/14   [facility] CM Short Term Goal #1 Met Date  12/02/14   Interventions for Short Term Goal #1  Discussed the importance of maintaining a regular visit schedule with primary care physician   [facility] CM Short Term Goal #2 (0-30 days)  Member will report taking medications as prescribed for the next 4 weeks   [facility] CM Short Term Goal #2 Start Date  12/03/14 [Original date not met, date reset]   Interventions for Short Term Goal #2  Discussed the importance of taking medications as prescribed to decrease the symptoms related to acid reflux, and refilling prescription for zofran      [name], BSN, [place] Manager [phone]

## 2014-12-03 ENCOUNTER — Other Ambulatory Visit: Payer: Self-pay | Admitting: *Deleted

## 2014-12-04 ENCOUNTER — Encounter: Payer: Self-pay | Admitting: *Deleted

## 2014-12-04 NOTE — Patient Outreach (Signed)
[facility]) Care Management  [facility] Social Work  12/04/2014  [name] Mar 21, 1934 [id number]  Subjective:    Assist with placement arrangements for patient into a long-term care facility versus a one-bedroom, ground level, low-income apartment.   Current Medications:  Current Outpatient Prescriptions  Medication Sig Dispense Refill  . Albuterol Sulfate (PROAIR RESPICLICK) 403 (90 BASE) MCG/ACT AEPB Inhale 2 puffs into the lungs every 6 (six) hours as needed. 1 each 11  . ALPRAZolam (XANAX) 0.5 MG tablet Take 1 tablet (0.5 mg total) by mouth at bedtime as needed for anxiety. 30 tablet 0  . budesonide-formoterol (SYMBICORT) 160-4.5 MCG/ACT inhaler Take 2 puffs first thing in am and then another 2 puffs about 12 hours later. (Patient taking differently: Inhale 2 puffs into the lungs 2 (two) times daily. Take 2 puffs first thing in am and then another 2 puffs about 12 hours later.) 1 Inhaler 11  . citalopram (CELEXA) 20 MG tablet Take 20 mg by mouth daily.    Marland Kitchen guaiFENesin (MUCINEX) 600 MG 12 hr tablet Take 1 tablet (600 mg total) by mouth 2 (two) times daily. (Patient not taking: Reported on 09/13/2014) 10 tablet 0  . ipratropium-albuterol (DUONEB) 0.5-2.5 (3) MG/3ML SOLN Take 3 mLs by nebulization every 6 (six) hours as needed (increased / severe wheezing , perisstent shortness of breath). 360 mL 0  . losartan (COZAAR) 50 MG tablet Take 1 tablet (50 mg total) by mouth daily. (Patient not taking: Reported on 09/13/2014) 30 tablet 0  . ondansetron (ZOFRAN) 4 MG tablet Take 1 tablet (4 mg total) by mouth every 6 (six) hours. (Patient not taking: Reported on 10/24/2014) 12 tablet 0  . oxyCODONE-acetaminophen (PERCOCET/ROXICET) 5-325 MG per tablet Take 1-2 tablets by mouth every 6 (six) hours as needed. 30 tablet 0   No current facility-administered medications for this visit.    Functional Status:  In your present state of health, do you have any difficulty performing the  following activities: 12/04/2014 11/26/2014  Hearing? [name]  Vision? N N  Difficulty concentrating or making decisions? N N  Walking or climbing stairs? Y Y  Dressing or bathing? N N  Doing errands, shopping? [name]  Preparing Food and eating ? Y Y  Using the Toilet? N N  In the past six months, have you accidently leaked urine? N N  Do you have problems with loss of bowel control? N N  Managing your Medications? N N  Managing your Finances? [name]  Housekeeping or managing your Housekeeping? [name]    Fall/Depression Screening:  PHQ 2/9 Scores 12/04/2014 12/02/2014 11/26/2014 11/24/2014 11/12/2014 10/15/2014  PHQ - 2 Score 0 0 0 0 0 1  PHQ- 9 Score - - - - - 2  Exception Documentation - - - - - Patient refusal    Assessment:   CSW was able to make contact with patient today to follow-up regarding possible placement into a one-bedroom, ground level, low income apartment, versus long-term care placement into an assisted living facility.  Patient indicated that his family is still agreeable to him moving out on his own, actually encouraging him to do so to hopefully improve his quality of life.  Patient reports that his wife, Xaine Sansom plans to move in with their daughter until she is able to save up enough money to move out on her own.  With regards to the house that patient and Mrs. Bilton are currently renting, patient reports that his wife and children  will have to take on the task of packing everything up and moving everything into storage, as patient is not physically capable of assisting with this process.  Patient only plans to take the basic necessities with him when he moves. Patient admitted to [place] that he has been experiencing a great deal of confusion and memory loss lately.  Patient does not believe that his long-term memory has been effected, he is just having difficulty recalling events that happened within the last few weeks or even days.  Patient does not appear to be concerned about the  confusion and memory loss but has been encouraged to discuss these issues with his Primary Care Physician, Dr. Seward Carol when he meets with him on Thursday, May 26th at 2:00pm for a routine follow-up appointment.  CSW agreed to attend this appointment with patient, as CSW wants to ensure that Dr. Delfina Redwood feels that it is safe for patient to live alone and perform activities of daily living independently.  Patient was agreeable to this plan. Directly after patient's follow-up with Dr. Delfina Redwood, [place] explained to patient that CSW would like to meet with patient to review the list of resources that CSW has found, with regards to possible placement options for patient.  These resources include a list of boarding houses that offer rooms for rent, assisted living facilities that accept [place] Medicaid coverage, short-term stay at the [facility] (owned by the [employer]), short-term stay at the [facility] (funded by [employer] and/or the [employer] and a list of low-income, one bedroom apartments that currently have vacancy on the ground floor. CSW is aware that patient would like to give Mrs. Nannini the majority on his [place] per month, as patient believes that he can live off of very little funds.  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 Patient indicated that he still has probably close to 3000 records and vinyls in his collection.  If patient is able to find the right marker, he believes he could make a "mint off his collection.  When CSW meets with patient on Thursday, May 26th at 2:00pm, CSW will assist patient with completion of a Long-Term Care Medicaid application and submit directly to the [facility] for processing.   Plan:   CSW will plan to meet with  patient for a routine visit on Thursday, May 26th at 2:00pm when patient is meeting with his Primary Care Physician, Dr. Seward Carol to discuss patient's memory deficits.  Nat Christen, BSW, MSW, [place] Management [place], [place], [place] [address] Di [email or web address] [phone]

## 2014-12-07 DIAGNOSIS — J449 Chronic obstructive pulmonary disease, unspecified: Secondary | ICD-10-CM | POA: Diagnosis not present

## 2014-12-11 ENCOUNTER — Other Ambulatory Visit: Payer: Self-pay | Admitting: *Deleted

## 2014-12-11 ENCOUNTER — Ambulatory Visit: Payer: Commercial Managed Care - HMO | Admitting: *Deleted

## 2014-12-11 DIAGNOSIS — N183 Chronic kidney disease, stage 3 (moderate): Secondary | ICD-10-CM | POA: Diagnosis not present

## 2014-12-11 DIAGNOSIS — G8929 Other chronic pain: Secondary | ICD-10-CM | POA: Diagnosis not present

## 2014-12-11 DIAGNOSIS — R000 Tachycardia, unspecified: Secondary | ICD-10-CM | POA: Diagnosis not present

## 2014-12-11 DIAGNOSIS — E782 Mixed hyperlipidemia: Secondary | ICD-10-CM | POA: Diagnosis not present

## 2014-12-11 DIAGNOSIS — F329 Major depressive disorder, single episode, unspecified: Secondary | ICD-10-CM | POA: Diagnosis not present

## 2014-12-11 DIAGNOSIS — J449 Chronic obstructive pulmonary disease, unspecified: Secondary | ICD-10-CM | POA: Diagnosis not present

## 2014-12-11 DIAGNOSIS — I10 Essential (primary) hypertension: Secondary | ICD-10-CM | POA: Diagnosis not present

## 2014-12-12 ENCOUNTER — Ambulatory Visit: Payer: Commercial Managed Care - HMO | Admitting: *Deleted

## 2014-12-13 ENCOUNTER — Encounter: Payer: Self-pay | Admitting: *Deleted

## 2014-12-13 NOTE — Patient Outreach (Addendum)
[place] [facility]) Care Management  [facility] Social Work  12/13/2014  [name] 1934/07/17 [id number]  Subjective:    Attend appointment with patient and Primary Care Physician, Dr. Seward Carol to discuss possible placement arrangements for patient.  Current Medications:  Current Outpatient Prescriptions  Medication Sig Dispense Refill  . Albuterol Sulfate (PROAIR RESPICLICK) 557 (90 BASE) MCG/ACT AEPB Inhale 2 puffs into the lungs every 6 (six) hours as needed. 1 each 11  . ALPRAZolam (XANAX) 0.5 MG tablet Take 1 tablet (0.5 mg total) by mouth at bedtime as needed for anxiety. 30 tablet 0  . budesonide-formoterol (SYMBICORT) 160-4.5 MCG/ACT inhaler Take 2 puffs first thing in am and then another 2 puffs about 12 hours later. (Patient taking differently: Inhale 2 puffs into the lungs 2 (two) times daily. Take 2 puffs first thing in am and then another 2 puffs about 12 hours later.) 1 Inhaler 11  . citalopram (CELEXA) 20 MG tablet Take 20 mg by mouth daily.    Marland Kitchen guaiFENesin (MUCINEX) 600 MG 12 hr tablet Take 1 tablet (600 mg total) by mouth 2 (two) times daily. (Patient not taking: Reported on 09/13/2014) 10 tablet 0  . ipratropium-albuterol (DUONEB) 0.5-2.5 (3) MG/3ML SOLN Take 3 mLs by nebulization every 6 (six) hours as needed (increased / severe wheezing , perisstent shortness of breath). 360 mL 0  . losartan (COZAAR) 50 MG tablet Take 1 tablet (50 mg total) by mouth daily. (Patient not taking: Reported on 09/13/2014) 30 tablet 0  . ondansetron (ZOFRAN) 4 MG tablet Take 1 tablet (4 mg total) by mouth every 6 (six) hours. (Patient not taking: Reported on 10/24/2014) 12 tablet 0  . oxyCODONE-acetaminophen (PERCOCET/ROXICET) 5-325 MG per tablet Take 1-2 tablets by mouth every 6 (six) hours as needed. 30 tablet 0   No current facility-administered medications for this visit.    Functional Status:  In your present state of health, do you have any difficulty performing the  following activities: 12/13/2014 12/04/2014  Hearing? [name]  Vision? N N  Difficulty concentrating or making decisions? N N  Walking or climbing stairs? Y Y  Dressing or bathing? N N  Doing errands, shopping? [name]  Preparing Food and eating ? Y Y  Using the Toilet? N N  In the past six months, have you accidently leaked urine? N N  Do you have problems with loss of bowel control? N N  Managing your Medications? N N  Managing your Finances? [name]  Housekeeping or managing your Housekeeping? [name]    Fall/Depression Screening:  PHQ 2/9 Scores 12/13/2014 12/04/2014 12/02/2014 11/26/2014 11/24/2014 11/12/2014 10/15/2014  PHQ - 2 Score 0 0 0 0 0 0 1  PHQ- 9 Score - - - - - - 2  Exception Documentation - - - - - - Patient refusal    Assessment:   CSW was able to meet with patient at his appointment with his Primary Care Physician, Dr. Seward Carol on Thursday, May 26th at 2:00pm.  Patient admitted to being "very short-of-breath" today, after realizing that his Helios tank had a kink in the cord so patient was not receiving adequate airflow.  When patient's oxygen saturation level was taken, patient's percentage was registering in the low 70's, while assuming he was receiving 4 liters of oxygen via nasal cannula.  Patient was immediately placed on a portable oxygen tank, readily available at the [facility].  When patient's oxygen saturation level was taken a second time, patient  registered at 94% on 4 liters, via nasal cannula. Patient appeared to be in good spirits today, well-groomed and joking with CSW and various other staff members, involved with his care.  Darlene, Dr. Lina Sar nurse agreed to have the receptionist contact patient to schedule his next visit, as patient appeared to be tiring fast, "needing to get home and get settled", per patient's request.  CSW also observed that the temperature outside, 89* at 2:54pm, was having an impact on patient's breathing condition.  CSW was able to  transport patient to his vehicle, via wheelchair, agreeing to follow patient home to ensure his safe return.  Patient was reluctant at first, but then agreed when he realized that CSW was genuinely concerned about his breathing condition and apparent fatigue. During the visit, patient disclosed to Dr. Delfina Redwood his plans to "separate from his wife, needing to eliminate the added stress in his life".  Dr. Delfina Redwood admitted to patient that he did not need his permission to make such changes in his life.  Dr. Delfina Redwood agreed to support patient with the changes he felt needed to be made in order to preserve his quality of life.  CSW took this opportunity to converse with Darlene in private about possible placement arrangements for patient.  CSW explained to [place] that patient appears to be agreeable to placement, based on recommendations made by Dr. Delfina Redwood, assisted living versus independent living versus palliative care.  Darlene agreed to have this conversation with Dr. Delfina Redwood, following up with CSW at her earliest convenience.  Darlene also agreed to have this conversation with patient to ensure that patient is on-board with arrangements being made on his behalf.  CSW was able to request a completed and signed FL-2 Form, in the event that placement arrangements need to be made for patient into an assisted living facility. CSW agreed to contact patient to schedule a routine home visit, as soon as CSW is able to report findings of conversation with Darlene and Dr. Delfina Redwood.  Based on Dr. Lina Sar recommendations, CSW will have a list of resources available to discuss with patient during the home visit.  Patient voiced understanding and was agreeable to this plan.  CSW will fax a correspondence letter to Dr. Delfina Redwood, as well as converse with patient's RNCM with [place] Management, [name] to report findings of office visit with patient today.  Patient is now aware that placement, of any kind, will  probably require his entire Social Security check, leaving his wife with little to no income and having to rely on their children for housing.  Plan:   CSW will schedule a home visit with patient to discuss placement options, as soon as recommendations are received from patient's Primary Care Physician, Dr. Seward Carol.  Nat Christen, BSW, MSW, [place] Management [place], [place], [place] [address] Di [email or web address] [phone]

## 2014-12-16 ENCOUNTER — Other Ambulatory Visit: Payer: Commercial Managed Care - HMO | Admitting: *Deleted

## 2014-12-16 NOTE — Patient Outreach (Signed)
[place] [facility]) Care Management  12/16/2014  [name] 06-05-1934 [id number]   CSW made an attempt to try and contact patient today to follow-up regarding possible placement options, as well as to schedule a routine home visit; however, patient was unavailable at the time of CSW's call.  A HIPAA compliant message was left on patient's voicemail.  CSW is currently awaiting a return call.  [name], BSW, MSW, [place] Management [place], [place],  [address] [email or web address] [phone]

## 2014-12-18 ENCOUNTER — Encounter: Payer: Self-pay | Admitting: *Deleted

## 2014-12-18 ENCOUNTER — Other Ambulatory Visit: Payer: Self-pay | Admitting: *Deleted

## 2014-12-18 NOTE — Patient Outreach (Signed)
[facility]) Care Management  [facility] Social Work  12/18/2014  [name] 01-23-1934 [id number]    Current Medications:  Current Outpatient Prescriptions  Medication Sig Dispense Refill  . Albuterol Sulfate (PROAIR RESPICLICK) 268 (90 BASE) MCG/ACT AEPB Inhale 2 puffs into the lungs every 6 (six) hours as needed. 1 each 11  . ALPRAZolam (XANAX) 0.5 MG tablet Take 1 tablet (0.5 mg total) by mouth at bedtime as needed for anxiety. 30 tablet 0  . budesonide-formoterol (SYMBICORT) 160-4.5 MCG/ACT inhaler Take 2 puffs first thing in am and then another 2 puffs about 12 hours later. (Patient taking differently: Inhale 2 puffs into the lungs 2 (two) times daily. Take 2 puffs first thing in am and then another 2 puffs about 12 hours later.) 1 Inhaler 11  . citalopram (CELEXA) 20 MG tablet Take 20 mg by mouth daily.    Marland Kitchen guaiFENesin (MUCINEX) 600 MG 12 hr tablet Take 1 tablet (600 mg total) by mouth 2 (two) times daily. (Patient not taking: Reported on 09/13/2014) 10 tablet 0  . ipratropium-albuterol (DUONEB) 0.5-2.5 (3) MG/3ML SOLN Take 3 mLs by nebulization every 6 (six) hours as needed (increased / severe wheezing , perisstent shortness of breath). 360 mL 0  . losartan (COZAAR) 50 MG tablet Take 1 tablet (50 mg total) by mouth daily. (Patient not taking: Reported on 09/13/2014) 30 tablet 0  . ondansetron (ZOFRAN) 4 MG tablet Take 1 tablet (4 mg total) by mouth every 6 (six) hours. (Patient not taking: Reported on 10/24/2014) 12 tablet 0  . oxyCODONE-acetaminophen (PERCOCET/ROXICET) 5-325 MG per tablet Take 1-2 tablets by mouth every 6 (six) hours as needed. 30 tablet 0   No current facility-administered medications for this visit.    Functional Status:  In your present state of health, do you have any difficulty performing the following activities: 12/13/2014 12/04/2014  Hearing? [name]  Vision? N N  Difficulty concentrating or making decisions? N N  Walking or climbing stairs? Y Y   Dressing or bathing? N N  Doing errands, shopping? [name]  Preparing Food and eating ? Y Y  Using the Toilet? N N  In the past six months, have you accidently leaked urine? N N  Do you have problems with loss of bowel control? N N  Managing your Medications? N N  Managing your Finances? [name]  Housekeeping or managing your Housekeeping? [name]    Fall/Depression Screening:  PHQ 2/9 Scores 12/13/2014 12/04/2014 12/02/2014 11/26/2014 11/24/2014 11/12/2014 10/15/2014  PHQ - 2 Score 0 0 0 0 0 0 1  PHQ- 9 Score - - - - - - 2  Exception Documentation - - - - - - Patient refusal    Assessment:   CSW was able to make contact with patient today to follow-up regarding possible placement options into an independent living facility versus an assisted living facility.  Patient appeared to be in good spirits today, apologizing for not driving CSW to her vehicle last week when CSW met with patient at his Primary Care Physician appointment with Dr. Seward Carol.  Patient indicated that he has really be "kicking himself over it", wishing he had offered CSW a ride to the parking deck to retrieve her vehicle.  CSW encouraged patient to think no more of it, as it had never even crossed CSW's mind. Patient indicated that he has spoken with Carlyon Shadow, Dr. Lina Sar Nurse and that he has expressed an interest in placement into an independent living facility  instead of assisted living.  Patient reported that he would prefer to live independently, as patient believes that he is capable of performing all activities of daily living independently, as well as managing his finances.  CSW explained to patient that this will be the avenue we pursue, per patient's request.  CSW encouraged patient to begin reviewing the list of independently living facilities that CSW provided to patient in the past and to decide on a location.  Patient has agreed to contact CSW on Wednesday, June 8th to report findings. No additional social work needs have  been identified at this time.  When CSW receives a return call from patient on Wednesday, June 8th, CSW will obtain a list of independent living facilities that patient is interested in having CSW pursue for placement, on his behalf.  Patient voiced understanding and was agreeable to this plan.  Patient is aware that he will need to take his entire Social Security check ($1,200.00) with him to pay expenses each month.  Plan:   CSW will obtain a list of independent living facilities that patient is interested in having CSW pursue for placement when patient makes contact with CSW on Wednesday, June 8th.  Nat Christen, BSW, MSW, [facility] [place], [place], [place] [id number] Di [email or web address] [phone]

## 2014-12-24 ENCOUNTER — Other Ambulatory Visit: Payer: Commercial Managed Care - HMO | Admitting: *Deleted

## 2014-12-24 NOTE — Patient Outreach (Signed)
[facility] [facility]) Care Management  12/24/2014  [name] 1934-04-05 [id number]   CSW made an attempt to try and contact patient today to follow-up regarding long-term care placement for patient into an independent living setting; however, patient was unavailable at the time of CSW's call.  A HIPAA compliant message was left on voicemail.  CSW is currently awaiting a return call.  Nat Christen, BSW, MSW, [facility] [place], [place], [place] [id number] [email or web address] [phone]

## 2014-12-30 ENCOUNTER — Other Ambulatory Visit: Payer: Self-pay | Admitting: *Deleted

## 2014-12-30 ENCOUNTER — Encounter: Payer: Self-pay | Admitting: *Deleted

## 2014-12-30 NOTE — Patient Outreach (Signed)
[facility]) Care Management   12/30/2014  [name] [birth year] [id number]  [name] is an 81 y.o. male  Subjective:   Member reports that he is "doing all right, not having any pain right now."  The member shows no signs of shortness of breath or discomfort at this time.  Objective:   Review of Systems  Constitutional: Negative.   HENT: Negative.   Eyes: Negative.   Respiratory: Negative.   Cardiovascular: Negative.   Gastrointestinal: Negative.   Genitourinary: Negative.   Musculoskeletal: Negative.   Skin: Negative.   Neurological: Negative.   Endo/Heme/Allergies: Negative.   Psychiatric/Behavioral: Negative.     Physical Exam  Constitutional: He is oriented to person, place, and time. He appears well-developed and well-nourished.  Neck: Normal range of motion.  Cardiovascular: Normal rate, regular rhythm and normal heart sounds.   Respiratory: Effort normal and breath sounds normal.  GI: Soft. Bowel sounds are normal.  Musculoskeletal: Normal range of motion.  Neurological: He is alert and oriented to person, place, and time.  Skin: Skin is warm and dry.   BP 118/70 mmHg  Pulse 87  Resp 18  SpO2 96%  Current Medications:   Current Outpatient Prescriptions  Medication Sig Dispense Refill  . Albuterol Sulfate (PROAIR RESPICLICK) 329 (90 BASE) MCG/ACT AEPB Inhale 2 puffs into the lungs every 6 (six) hours as needed. 1 each 11  . ALPRAZolam (XANAX) 0.5 MG tablet Take 1 tablet (0.5 mg total) by mouth at bedtime as needed for anxiety. 30 tablet 0  . budesonide-formoterol (SYMBICORT) 160-4.5 MCG/ACT inhaler Take 2 puffs first thing in am and then another 2 puffs about 12 hours later. (Patient taking differently: Inhale 2 puffs into the lungs 2 (two) times daily. Take 2 puffs first thing in am and then another 2 puffs about 12 hours later.) 1 Inhaler 11  . citalopram (CELEXA) 20 MG tablet Take 20 mg by mouth daily.    Marland Kitchen guaiFENesin  (MUCINEX) 600 MG 12 hr tablet Take 1 tablet (600 mg total) by mouth 2 (two) times daily. (Patient not taking: Reported on 09/13/2014) 10 tablet 0  . ipratropium-albuterol (DUONEB) 0.5-2.5 (3) MG/3ML SOLN Take 3 mLs by nebulization every 6 (six) hours as needed (increased / severe wheezing , perisstent shortness of breath). 360 mL 0  . losartan (COZAAR) 50 MG tablet Take 1 tablet (50 mg total) by mouth daily. (Patient not taking: Reported on 09/13/2014) 30 tablet 0  . ondansetron (ZOFRAN) 4 MG tablet Take 1 tablet (4 mg total) by mouth every 6 (six) hours. (Patient not taking: Reported on 10/24/2014) 12 tablet 0  . oxyCODONE-acetaminophen (PERCOCET/ROXICET) 5-325 MG per tablet Take 1-2 tablets by mouth every 6 (six) hours as needed. 30 tablet 0   No current facility-administered medications for this visit.    Functional Status:   In your present state of health, do you have any difficulty performing the following activities: 12/13/2014 12/04/2014  Hearing? [name]  Vision? N N  Difficulty concentrating or making decisions? N N  Walking or climbing stairs? Y Y  Dressing or bathing? N N  Doing errands, shopping? [name]  Preparing Food and eating ? Y Y  Using the Toilet? N N  In the past six months, have you accidently leaked urine? N N  Do you have problems with loss of bowel control? N N  Managing your Medications? N N  Managing your Finances? [name]  Housekeeping or managing your Housekeeping? [name]  Fall/Depression Screening:    PHQ 2/9 Scores 12/13/2014 12/04/2014 12/02/2014 11/26/2014 11/24/2014 11/12/2014 10/15/2014  PHQ - 2 Score 0 0 0 0 0 0 1  PHQ- 9 Score - - - - - - 2  Exception Documentation - - - - - - Patient refusal    Assessment:    Met with member at his home at scheduled time.  Member reports that he is doing about the same, but not any worse.  He is in good spirits, but as with last visit, continues to speak on finding a cure for COPD and expresses wishes on getting better.  Member's  disease process discussed again, including that at this time that there is still no cure.  Member informed that unfortunately this disease process will continue to display a decline in his health status.  He verbalizes understanding and states that he was speaking to his son about a previous conversation between this care manager and himself regarding his disease.  Member states that he just want to "make it to 90 and I'll be good."  He also reports that he would like for his daughter to stop smoking and is continuing to talk to her about smoking cessation.  Member states that his nausea has decreased and that he has restarted taking Phenergan.  Instead of taking a whole pill, he is taking a half pill per Dr. Lina Sar instruction.  He states that since taking this he has been able to sleep and does not have much nausea.  He states that he will not take anymore Nexium because he has seen on television the complications of "medicines like that."  He states that he will be fine with only the phenergan.  Member made aware that he has reached all of his goals with the care manager, and that his case could be closed.  Informed that he will continue to work with the Education officer, museum, J. Saporito, on community resources.  Informed member that nursing care management would still be available to him if needed and to notify the social worker if his health status changed and there was a need.  Member verbalized understanding and is grateful for the involvement with this care manager.   Plan:   Will notify care manager assistant to close case for community care manager. Will discuss with social worker the indications for case closure from the nursing aspect.  [facility] CM Care Plan Problem One        Patient Outreach from 11/21/2014 in [place] Problem One  Lack of financial resources to pay moving expenses.   Care Plan for Problem One  Active   [facility] CM Short Term Goal #1 (0-30 days)  Patient will  have conversation with family regarding yard sale within the next week.   [facility] CM Short Term Goal #1 Start Date  11/12/14   [facility] CM Short Term Goal #1 Met Date  11/21/14   Interventions for Short Term Goal #1  CSW will meet with Giving Committee Members in one week to discuss Special Projects request for charity funding.    [facility] CM Care Plan Problem Two        Patient Outreach Telephone from 12/11/2014 in [place] Problem Two  Patient will talk with wife and family about moving into a one-bedroom apartment alone, within the next week.   Care Plan for Problem Two  Active   [facility] CM Short Term Goal #1 (0-30 days)  CSW will contact patient in one week to discuss verdict of conversation with wife and children regarding patient moving into a one-bedroom apartment alone.   [facility] CM Short Term Goal #1 Start Date  11/21/14   [facility] CM Short Term Goal #1 Met Date   11/26/14   [facility] CM Short Term Goal #2 (0-30 days)  CSW will assist patient with locating low income, one-bedroom apartments on the ground floor that patient can move into alone.   [facility] CM Short Term Goal #2 Met Date  12/11/14    [facility] CM Care Plan Problem Three        Patient Outreach from 12/30/2014 in [place] Problem Three  Nausea   Care Plan for Problem Three  Not Active   [facility] Long Term Goal (31-90) days  Member will report the nause issue as being resolved within the next 31 days   [facility] Long Term Goal Start Date  12/02/14   [facility] Long Term Goal Met Date  12/30/14   Interventions for Problem Three Long Term Goal  Instructed to speak with primary care physician and seek affordable medicaiton options.   [facility] CM Short Term Goal #1 (0-30 days)  Member will make appointment with Dr. Delfina Redwood within the next 4 weeks   [facility] CM Short Term Goal #1 Start Date  10/24/14   [facility] CM Short Term Goal #1 Met Date  12/02/14   Interventions for Short Term Goal #1  Discussed the importance of maintaining a regular visit  schedule with primary care physician   [facility] CM Short Term Goal #2 (0-30 days)  Member will report taking medications as prescribed for the next 4 weeks   [facility] CM Short Term Goal #2 Start Date  12/03/14   [facility] CM Short Term Goal #2 Met Date  12/30/14   Interventions for Short Term Goal #2  Discussed the importance of taking medications as prescribed to decrease the symptoms related to acid reflux, and refilling prescription for zofran      [name], BSN, [place] [facility] [phone]

## 2014-12-31 ENCOUNTER — Other Ambulatory Visit: Payer: Self-pay | Admitting: *Deleted

## 2014-12-31 ENCOUNTER — Encounter: Payer: Self-pay | Admitting: *Deleted

## 2014-12-31 NOTE — Progress Notes (Signed)
This encounter was created in error - please disregard.

## 2015-01-01 ENCOUNTER — Encounter: Payer: Self-pay | Admitting: *Deleted

## 2015-01-01 NOTE — Patient Outreach (Signed)
[facility]) Care Management  [facility] Social Work  01/01/2015  [name] 1933/12/08 [id number]    Current Medications:  Current Outpatient Prescriptions  Medication Sig Dispense Refill  . Albuterol Sulfate (PROAIR RESPICLICK) 194 (90 BASE) MCG/ACT AEPB Inhale 2 puffs into the lungs every 6 (six) hours as needed. 1 each 11  . ALPRAZolam (XANAX) 0.5 MG tablet Take 1 tablet (0.5 mg total) by mouth at bedtime as needed for anxiety. 30 tablet 0  . budesonide-formoterol (SYMBICORT) 160-4.5 MCG/ACT inhaler Take 2 puffs first thing in am and then another 2 puffs about 12 hours later. (Patient taking differently: Inhale 2 puffs into the lungs 2 (two) times daily. Take 2 puffs first thing in am and then another 2 puffs about 12 hours later.) 1 Inhaler 11  . citalopram (CELEXA) 20 MG tablet Take 20 mg by mouth daily.    Marland Kitchen guaiFENesin (MUCINEX) 600 MG 12 hr tablet Take 1 tablet (600 mg total) by mouth 2 (two) times daily. (Patient not taking: Reported on 09/13/2014) 10 tablet 0  . ipratropium-albuterol (DUONEB) 0.5-2.5 (3) MG/3ML SOLN Take 3 mLs by nebulization every 6 (six) hours as needed (increased / severe wheezing , perisstent shortness of breath). 360 mL 0  . losartan (COZAAR) 50 MG tablet Take 1 tablet (50 mg total) by mouth daily. (Patient not taking: Reported on 09/13/2014) 30 tablet 0  . ondansetron (ZOFRAN) 4 MG tablet Take 1 tablet (4 mg total) by mouth every 6 (six) hours. (Patient not taking: Reported on 10/24/2014) 12 tablet 0  . oxyCODONE-acetaminophen (PERCOCET/ROXICET) 5-325 MG per tablet Take 1-2 tablets by mouth every 6 (six) hours as needed. 30 tablet 0   No current facility-administered medications for this visit.    Functional Status:  In your present state of health, do you have any difficulty performing the following activities: 01/01/2015 12/13/2014  Hearing? [name]  Vision? N N  Difficulty concentrating or making decisions? N N  Walking or climbing stairs? Y  Y  Dressing or bathing? N N  Doing errands, shopping? [name]  Preparing Food and eating ? Y Y  Using the Toilet? N N  In the past six months, have you accidently leaked urine? N N  Do you have problems with loss of bowel control? N N  Managing your Medications? N N  Managing your Finances? [name]  Housekeeping or managing your Housekeeping? [name]    Fall/Depression Screening:  PHQ 2/9 Scores 01/01/2015 12/13/2014 12/04/2014 12/02/2014 11/26/2014 11/24/2014 11/12/2014  PHQ - 2 Score 0 0 0 0 0 0 0  PHQ- 9 Score - - - - - - -  Exception Documentation - - - - - - -    Assessment:   CSW was able to meet with patient today to perform a routine home visit.  Patient was in great spirits when CSW arrived.  Patient's wife, [name] was not present at the time of CSW's arrival.  Patient denied having any recent loss of memory or confusion spells.  Patient also denied having any new symptoms associated with his diagnosis of COPD (Chronic Obstructive Pulmonary Disease).  Patient admits to taking his medications as prescribed and wearing his oxygen at all times.  Patient indicated that he avoids going outside when the temperature outside is above 85*, as it is already extremely difficult for him to try to breath. CSW and patient were able to review a few independent living options for patient to move into; however, CSW is  reluctant to encourage independent living for patient at this time.  CSW fears that patient may require a higher level of care, such as assisted living, as patient will require assistance with activities of daily living, meal preparation, light house-keeping duties, etc.  CSW also fears that patient is somewhat in denial about his medical condition and the severity of his illness.  CSW had a long conversation with patient's RNCM with [place] [facility], [name] to discuss best plan of care for patient at this time. CSW has not broached the subject with patient; however,  wonders if patient would be appropriate for Hospice services.  CSW will address the issue with patient's Primary Care Physician, Dr. Seward Carol, as an entirely new set of options would become available for patient, with regards to placement/long-term care.  CSW wants to ensure that CSW is not setting patient up to fail by placing patient into a facility that does not offer the appropriate level of care.  CSW was very transparent with patient, explaining that CSW fears that CSW would be doing patient a disservice by not evaluating all options. Patient voiced understanding, knowing that CSW has his best interest at heart.  Patient is also aware that placement can take time, especially when income is limited and appropriate level of care is undetermined.  Patient has already agreed to be patient with CSW during this process, allowing CSW to measures all possible outcomes.  Patient is aware that CSW will be on vacation from June 17th through June 27th and has been encouraged to contact the main [facility] office if he needs social work assistance during this period of time.  CSW and patient have agreed to meet for the next routine home visit on Friday, July 1st at 11:00am to further pursue placement arrangements for patient.  Plan:   CSW will meet with patient for a routine home visit on Friday, July 1st at 11:00am to further pursue placement arrangements for patient.  Nat Christen, BSW, MSW, [facility] [place], [place], [place] [id number] [email or web address] [phone]

## 2015-01-07 DIAGNOSIS — J449 Chronic obstructive pulmonary disease, unspecified: Secondary | ICD-10-CM | POA: Diagnosis not present

## 2015-01-16 ENCOUNTER — Other Ambulatory Visit: Payer: Self-pay | Admitting: *Deleted

## 2015-01-18 ENCOUNTER — Encounter: Payer: Self-pay | Admitting: *Deleted

## 2015-01-18 NOTE — Patient Outreach (Signed)
[facility]) Care Management  [facility] Social Work  01/18/2015  SUHAAN PERLEBERG Jun 22, 1934 [id number]   Current Medications:  Current Outpatient Prescriptions  Medication Sig Dispense Refill  . Albuterol Sulfate (PROAIR RESPICLICK) 694 (90 BASE) MCG/ACT AEPB Inhale 2 puffs into the lungs every 6 (six) hours as needed. 1 each 11  . ALPRAZolam (XANAX) 0.5 MG tablet Take 1 tablet (0.5 mg total) by mouth at bedtime as needed for anxiety. 30 tablet 0  . budesonide-formoterol (SYMBICORT) 160-4.5 MCG/ACT inhaler Take 2 puffs first thing in am and then another 2 puffs about 12 hours later. (Patient taking differently: Inhale 2 puffs into the lungs 2 (two) times daily. Take 2 puffs first thing in am and then another 2 puffs about 12 hours later.) 1 Inhaler 11  . citalopram (CELEXA) 20 MG tablet Take 20 mg by mouth daily.    Marland Kitchen guaiFENesin (MUCINEX) 600 MG 12 hr tablet Take 1 tablet (600 mg total) by mouth 2 (two) times daily. (Patient not taking: Reported on 09/13/2014) 10 tablet 0  . ipratropium-albuterol (DUONEB) 0.5-2.5 (3) MG/3ML SOLN Take 3 mLs by nebulization every 6 (six) hours as needed (increased / severe wheezing , perisstent shortness of breath). 360 mL 0  . losartan (COZAAR) 50 MG tablet Take 1 tablet (50 mg total) by mouth daily. (Patient not taking: Reported on 09/13/2014) 30 tablet 0  . ondansetron (ZOFRAN) 4 MG tablet Take 1 tablet (4 mg total) by mouth every 6 (six) hours. (Patient not taking: Reported on 10/24/2014) 12 tablet 0  . oxyCODONE-acetaminophen (PERCOCET/ROXICET) 5-325 MG per tablet Take 1-2 tablets by mouth every 6 (six) hours as needed. 30 tablet 0   No current facility-administered medications for this visit.    Functional Status:  In your present state of health, do you have any difficulty performing the following activities: 01/01/2015 12/13/2014  Hearing? [name]  Vision? N N  Difficulty concentrating or making decisions? N N  Walking or climbing stairs? Y Y   Dressing or bathing? N N  Doing errands, shopping? [name]  Preparing Food and eating ? Y Y  Using the Toilet? N N  In the past six months, have you accidently leaked urine? N N  Do you have problems with loss of bowel control? N N  Managing your Medications? N N  Managing your Finances? [name]  Housekeeping or managing your Housekeeping? [name]    Fall/Depression Screening:  PHQ 2/9 Scores 01/01/2015 12/13/2014 12/04/2014 12/02/2014 11/26/2014 11/24/2014 11/12/2014  PHQ - 2 Score 0 0 0 0 0 0 0  PHQ- 9 Score - - - - - - -  Exception Documentation - - - - - - -    Assessment:   CSW was able to meet with patient today to perform a routine home visit.  Patient appeared to be in good spirits, sitting downstairs in his living room when CSW arrived.  Patient is usually upstairs in his bedroom, where the room is cluttered and the temperature is quite warm.  It is also dark inside patient's bedroom, especially with the shades drawn.  CSW made mention of this to patient during the last home visit, encouraging patient to at least let a little light into the room, if he plans to stay cooped up all day, so the atmosphere is a little less depressing and patient is less likely to remain isolative. CSW was able to provide patient with a list of resources for independent living; however, CSW is reluctant  to continue to encourage an independent level of care.  Independent care is patient's first option, but may not be the most appropriate, given patient's Acute Chronic Respiratory Failure and COPD (Chronic Obstructive Pulmonary Disease) with Exacerbation.  Patient also appears to be somewhat reluctant to make any major life decisions at present, still concerned about his wife and where she will reside if he moves out and gets his own home.  Patient's wife, Nikholas Geffre is financially dependent upon patient. 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   Patient really regrets not having saved, what he referred to as "A very nice income for many years".  Patient wishes he had at least taken out a retirement plan, something to fall back on, now that he really needs the money in his older adulthood.  CSW encouraged patient not to dwell on the things that he cannot change, rather focus on being creative and utilizing available resources.  CSW offered counseling and supportive services, where appropriate. Patient's son and son's girlfriend are currently out-of-state, but patient plans to talk with them about allowing his wife, his son's mother, to live with them, upon their return. If Mrs. Lanese requires little income, then patient will have a lot more resources available to him with regards to placement/long-term care.  CSW will continue to converse with patient to offer resources, as they come available.  Once patient's son is back in the [place] and a decision can be made about Mrs. Ebel, CSW will know more and be able to offer patient additional resources.  Patient voiced understanding and was agreeable to this plan.   Plan:   CSW will fax a correspondence letter to patient's Primary Care Physician, Dr. Seward Carol to report findings of routine home visit with patient today. CSW will continue to follow patient to assist with placement arrangements into a long-term care facility/independent living facility.  Nat Christen, BSW, MSW, [place] [facility] [place], [place], [place] [address] Di [email or web address] [phone]

## 2015-01-21 ENCOUNTER — Other Ambulatory Visit: Payer: Self-pay | Admitting: *Deleted

## 2015-01-21 NOTE — Patient Outreach (Signed)
[facility]) Care Management  01/21/2015  KONG PACKETT 06-02-1934 [id number]   CSW was able to make contact with patient today to confirm next routine home visit, scheduled for Thursday, July 14th at 10:00am for CSW to assist patient with placement into a long-term care facility.  Two HIPAA compliant identifiers were obtain from patient, including name and date of birth.  Nat Christen, BSW, MSW, [facility] [place], [place], [place] [address] Di [email or web address] [phone]

## 2015-01-29 ENCOUNTER — Other Ambulatory Visit: Payer: Self-pay | Admitting: *Deleted

## 2015-01-30 ENCOUNTER — Encounter: Payer: Self-pay | Admitting: *Deleted

## 2015-01-30 NOTE — Patient Outreach (Signed)
[facility]) Care Management  [facility] Social Work  01/30/2015  SYMON NORWOOD 1934-05-08 [id number]  Current Medications:  Current Outpatient Prescriptions  Medication Sig Dispense Refill  . Albuterol Sulfate (PROAIR RESPICLICK) 500 (90 BASE) MCG/ACT AEPB Inhale 2 puffs into the lungs every 6 (six) hours as needed. 1 each 11  . ALPRAZolam (XANAX) 0.5 MG tablet Take 1 tablet (0.5 mg total) by mouth at bedtime as needed for anxiety. 30 tablet 0  . budesonide-formoterol (SYMBICORT) 160-4.5 MCG/ACT inhaler Take 2 puffs first thing in am and then another 2 puffs about 12 hours later. (Patient taking differently: Inhale 2 puffs into the lungs 2 (two) times daily. Take 2 puffs first thing in am and then another 2 puffs about 12 hours later.) 1 Inhaler 11  . citalopram (CELEXA) 20 MG tablet Take 20 mg by mouth daily.    Marland Kitchen guaiFENesin (MUCINEX) 600 MG 12 hr tablet Take 1 tablet (600 mg total) by mouth 2 (two) times daily. (Patient not taking: Reported on 09/13/2014) 10 tablet 0  . ipratropium-albuterol (DUONEB) 0.5-2.5 (3) MG/3ML SOLN Take 3 mLs by nebulization every 6 (six) hours as needed (increased / severe wheezing , perisstent shortness of breath). 360 mL 0  . losartan (COZAAR) 50 MG tablet Take 1 tablet (50 mg total) by mouth daily. (Patient not taking: Reported on 09/13/2014) 30 tablet 0  . ondansetron (ZOFRAN) 4 MG tablet Take 1 tablet (4 mg total) by mouth every 6 (six) hours. (Patient not taking: Reported on 10/24/2014) 12 tablet 0  . oxyCODONE-acetaminophen (PERCOCET/ROXICET) 5-325 MG per tablet Take 1-2 tablets by mouth every 6 (six) hours as needed. 30 tablet 0   No current facility-administered medications for this visit.    Functional Status:  In your present state of health, do you have any difficulty performing the following activities: 01/30/2015 01/01/2015  Hearing? [name]  Vision? N N  Difficulty concentrating or making decisions? N N  Walking or climbing stairs? Y Y   Dressing or bathing? N N  Doing errands, shopping? [name]  Preparing Food and eating ? Y Y  Using the Toilet? N N  In the past six months, have you accidently leaked urine? N N  Do you have problems with loss of bowel control? N N  Managing your Medications? N N  Managing your Finances? [name]  Housekeeping or managing your Housekeeping? [name]    Fall/Depression Screening:  PHQ 2/9 Scores 01/30/2015 01/01/2015 12/13/2014 12/04/2014 12/02/2014 11/26/2014 11/24/2014  PHQ - 2 Score 0 0 0 0 0 0 0  PHQ- 9 Score - - - - - - -  Exception Documentation - - - - - - -    Assessment:   CSW was able to meet with patient today to perform the routine home visit.  Patient was pleasant and well-groomed today, sitting in an armchair in the living room, gazing out the window when CSW arrived.  Also present was patient's wife, Garald Rhew and patient's daughter, Radek Carnero.  [name] and Candace were in the process of leaving the house to go work out a [employer]. Prior to leaving, Candace asked patient for $20.00 to put gas in the car and buy groceries.  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  Patient indicated that [name] has been "clean" for  close to 5 years, but still borrows money from him on a regular basis.  Candace does not have a drivers license and currently lives in [place], having to take the [employer]) bus wherever she travels.  Candace currently receives Social Security Disability, unable to work due to the severity of her mental illness. Patient admitted to [place] today, that after 43 years of marriage, he is "finally ready to leave the wife".  Patient stated, "She has a degree in [employer] and carry on a conversation about religion and politics, but has no common sense".   Patient went on to say that he would have left [name] a long time ago if he knew that she would be able to support herself.  Patient believes that [name] is not capable of managing her money, nor is she aware of what it takes to live alone and be able to manage her finances.  [name] has always relied on patient to pay the bills, regardless of him having to "hustle for money each month".  Patient indicated that he continues to borrow money from friends, family members, previous business associates, etc., with the expectation that they may never get reimbursed.  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  Patient continues to borrow money from the bank, and is currently over-drawn on all accounts. CSW and patient were able to discuss placement options for patient, options that patient would be agreeable too for long-term care.  CSW explained to patient that CSW could make arrangements to get patient into the [facility], owned and operated through the [employer] and [place] with the [employer].  CSW would just need a move-in date from patient in order to make the arrangements. Patient admitted that he will know more, once he is able to speak with his son, Meric Joye father-in law.  Apparently the father-in-law recently came into 4 million dollars, by chance, so patient plans to try and work out a business deal with him in order to obtain 10 thousand dollars.  This meeting is scheduled to take place on Friday, July 22nd, when patient plans to have them over for lunch while [name] is out of town.  Patient plans to take out a 10 thousand dollar life insurance policy on himself, making the father-in-law his beneficiary, so that the 10 thousand dollars will be reimbursed to him when patient dies.  Patient admitted that this option is his last resort. If patient is able to receive the 10 thousand dollars, he plans to assist [name] with moving expenses, to  get her out of the rental house where they currently reside, pay the down payment for her an apartment, as well as the first months rent.  If there is any money left over, patient plans to try and live off of it for the remainder of his life.  Patient reported, "I don't require much, just a roof over my head and at least one hot meal per day".  Patient will also be able to retain his Social Security Income, which is a little more than a thousand dollars per month.  Patient indicated that he will be able to provide CSW with a "move-out"date during the next home visit.  The next home visit is scheduled for Tuesday, July 26th at noon, as CSW has agreed to bring patient his favorite meal, a [employer] barbeque sandwich and peach cobbler.  Plan:   CSW  will meet with patient for the next routine home visit on Tuesday, July 26th at 12:00pm to further pursue placement arrangements for patient. CSW will fax a correspondence letter to patient's Primary Care Physician, Dr. Seward Carol to ensure that Dr. Delfina Redwood is aware of CSW's involvement with patient.  Nat Christen, BSW, MSW, [facility] [place], [place], [place] [address] Di [email or web address] [phone]

## 2015-02-06 DIAGNOSIS — J449 Chronic obstructive pulmonary disease, unspecified: Secondary | ICD-10-CM | POA: Diagnosis not present

## 2015-02-10 ENCOUNTER — Other Ambulatory Visit: Payer: Self-pay | Admitting: *Deleted

## 2015-02-10 ENCOUNTER — Encounter: Payer: Self-pay | Admitting: *Deleted

## 2015-02-10 NOTE — Patient Outreach (Addendum)
[name]) Care Management  [name] Social Work  02/10/2015  [name] 1933-10-21 [id number]   Current Medications:  Current Outpatient Prescriptions  Medication Sig Dispense Refill  . Albuterol Sulfate (PROAIR RESPICLICK) 824 (90 BASE) MCG/ACT AEPB Inhale 2 puffs into the lungs every 6 (six) hours as needed. 1 each 11  . ALPRAZolam (XANAX) 0.5 MG tablet Take 1 tablet (0.5 mg total) by mouth at bedtime as needed for anxiety. 30 tablet 0  . budesonide-formoterol (SYMBICORT) 160-4.5 MCG/ACT inhaler Take 2 puffs first thing in am and then another 2 puffs about 12 hours later. (Patient taking differently: Inhale 2 puffs into the lungs 2 (two) times daily. Take 2 puffs first thing in am and then another 2 puffs about 12 hours later.) 1 Inhaler 11  . citalopram (CELEXA) 20 MG tablet Take 20 mg by mouth daily.    Marland Kitchen guaiFENesin (MUCINEX) 600 MG 12 hr tablet Take 1 tablet (600 mg total) by mouth 2 (two) times daily. (Patient not taking: Reported on 09/13/2014) 10 tablet 0  . ipratropium-albuterol (DUONEB) 0.5-2.5 (3) MG/3ML SOLN Take 3 mLs by nebulization every 6 (six) hours as needed (increased / severe wheezing , perisstent shortness of breath). 360 mL 0  . losartan (COZAAR) 50 MG tablet Take 1 tablet (50 mg total) by mouth daily. (Patient not taking: Reported on 09/13/2014) 30 tablet 0  . ondansetron (ZOFRAN) 4 MG tablet Take 1 tablet (4 mg total) by mouth every 6 (six) hours. (Patient not taking: Reported on 10/24/2014) 12 tablet 0  . oxyCODONE-acetaminophen (PERCOCET/ROXICET) 5-325 MG per tablet Take 1-2 tablets by mouth every 6 (six) hours as needed. 30 tablet 0   No current facility-administered medications for this visit.    Functional Status:  In your present state of health, do you have any difficulty performing the following activities: 02/10/2015 01/30/2015  Hearing? [name]  Vision? N N  Difficulty concentrating or making decisions? Y N  Walking or climbing stairs? Y Y   Dressing or bathing? N N  Doing errands, shopping? [name]  Preparing Food and eating ? Y Y  Using the Toilet? N N  In the past six months, have you accidently leaked urine? N N  Do you have problems with loss of bowel control? N N  Managing your Medications? N N  Managing your Finances? [name]  Housekeeping or managing your Housekeeping? [name]    Fall/Depression Screening:  PHQ 2/9 Scores 02/10/2015 01/30/2015 01/01/2015 12/13/2014 12/04/2014 12/02/2014 11/26/2014  PHQ - 2 Score 0 0 0 0 0 0 0  PHQ- 9 Score - - - - - - -  Exception Documentation - - - - - - -    Assessment:   CSW was able to meet with patient today to perform a routine home visit.  Patient admitted to experiencing a great deal of nausea today, unable to eat anything and drink very little, for several days.  CSW encouraged patient to at least try and drink something to stay hydrated.  Patient voiced understanding.  Patient indicated that he has an appointment scheduled with Dr. Seward Mckinney, Primary Care Physician, on Thursday, July 28th at 1:00pm for routine follow-up.  CSW spoke with patient about obtaining a [name] Oxygen tank for portable use, as patient currently struggles to maneuver around his home with his current oxygen tank.  CSW explained to patient that patient will need to obtain an order for the device, which patient can request from Dr. Delfina Mckinney.  CSW encouraged patient to speak with Dr. Delfina Mckinney about ordering the [name] Oxygen tank through [name] while present at the appointment on Thursday the 28th. Patient reported that his life-long friend and previous business partner, [name] is caring for patient while his wife, [name] is away at the beach this week.  [name] the house and prepares patient's meals, in exchange for a place to stay.  Patient further reported that several of his friends and family members have called him this week to check in with him, knowing that [name] is out-of-town.   Unfortunately, patient has not received a call from his daughter, [name] or son, [name].  However, patient indicated that [name] has called or sent a text to him at least five times per day. Patient talked a lot about Mrs. [name] and negative decisions that the two of them have made together over the course of 54 years.  Patient's biggest regret was not putting money away for retirement.  Patient was able to converse with [name]'s father-in-law, the gentleman that patient plans to borrow money from, who reported that he needed some time to think about patient's proposition.  Patient is hopeful that he will receive an answer this week, wanting to make the transaction before Mrs. [name] returns home. Patient admits to still having cravings for cigarettes, every once in a while, having smoked a pack and half of cigarettes per day for 50 + years.  Patient further admitted that he was still smoking, even while on oxygen.  Patient quit smoking roughly eight years ago, along with drinking alcohol.  Patient reported, "Listen, I have had a great life, I just don't want to die, I'd like to have at least 21 more years to live".  Patient is fearful of death, reporting "I can't die, I have too many people in my life to take care of still".  Patient indicated that he has always tried to be a good father, husband and provider, to the point that he has often neglected his own basic needs. Patient talked a lot about his childhood and how his parent were never there for him.  Patient was not resentful about his parents not coming to watch him participate in sports, etc., but he really wishes that they had spent more time educating him about "the ways of the world".  Patient further reported, "They could have provided a little more attention and affection, too". Patient reported, "I tried really had not to make the same mistakes with my own children".  Plan:   CSW will plan to meet with patient for the next routine home  visit on Wednesday, August 10th at 10:30am.  Nat Christen, BSW, MSW, [facility] [place], [place], [place] [address] Di [email or web address] [phone]

## 2015-02-19 DIAGNOSIS — F419 Anxiety disorder, unspecified: Secondary | ICD-10-CM | POA: Diagnosis not present

## 2015-02-19 DIAGNOSIS — J449 Chronic obstructive pulmonary disease, unspecified: Secondary | ICD-10-CM | POA: Diagnosis not present

## 2015-02-19 DIAGNOSIS — R911 Solitary pulmonary nodule: Secondary | ICD-10-CM | POA: Diagnosis not present

## 2015-02-19 DIAGNOSIS — G8929 Other chronic pain: Secondary | ICD-10-CM | POA: Diagnosis not present

## 2015-02-25 ENCOUNTER — Other Ambulatory Visit: Payer: Self-pay | Admitting: *Deleted

## 2015-02-26 ENCOUNTER — Encounter: Payer: Self-pay | Admitting: *Deleted

## 2015-02-26 NOTE — Patient Outreach (Signed)
[facility]) Care Management  [facility] Social Work  02/26/2015  [name] Jan 23, 1934 [id number]    Current Medications:  Current Outpatient Prescriptions  Medication Sig Dispense Refill  . Albuterol Sulfate (PROAIR RESPICLICK) 240 (90 BASE) MCG/ACT AEPB Inhale 2 puffs into the lungs every 6 (six) hours as needed. 1 each 11  . ALPRAZolam (XANAX) 0.5 MG tablet Take 1 tablet (0.5 mg total) by mouth at bedtime as needed for anxiety. 30 tablet 0  . budesonide-formoterol (SYMBICORT) 160-4.5 MCG/ACT inhaler Take 2 puffs first thing in am and then another 2 puffs about 12 hours later. (Patient taking differently: Inhale 2 puffs into the lungs 2 (two) times daily. Take 2 puffs first thing in am and then another 2 puffs about 12 hours later.) 1 Inhaler 11  . citalopram (CELEXA) 20 MG tablet Take 20 mg by mouth daily.    Marland Kitchen guaiFENesin (MUCINEX) 600 MG 12 hr tablet Take 1 tablet (600 mg total) by mouth 2 (two) times daily. (Patient not taking: Reported on 09/13/2014) 10 tablet 0  . ipratropium-albuterol (DUONEB) 0.5-2.5 (3) MG/3ML SOLN Take 3 mLs by nebulization every 6 (six) hours as needed (increased / severe wheezing , perisstent shortness of breath). 360 mL 0  . losartan (COZAAR) 50 MG tablet Take 1 tablet (50 mg total) by mouth daily. (Patient not taking: Reported on 09/13/2014) 30 tablet 0  . ondansetron (ZOFRAN) 4 MG tablet Take 1 tablet (4 mg total) by mouth every 6 (six) hours. (Patient not taking: Reported on 10/24/2014) 12 tablet 0  . oxyCODONE-acetaminophen (PERCOCET/ROXICET) 5-325 MG per tablet Take 1-2 tablets by mouth every 6 (six) hours as needed. 30 tablet 0   No current facility-administered medications for this visit.    Functional Status:  In your present state of health, do you have any difficulty performing the following activities: 02/26/2015 02/10/2015  Hearing? [name]  Vision? Y N  Difficulty concentrating or making decisions? [name]  Walking or climbing stairs? Y  Y  Dressing or bathing? N N  Doing errands, shopping? [name]  Preparing Food and eating ? Y Y  Using the Toilet? N N  In the past six months, have you accidently leaked urine? N N  Do you have problems with loss of bowel control? N N  Managing your Medications? N N  Managing your Finances? [name]  Housekeeping or managing your Housekeeping? [name]    Fall/Depression Screening:  PHQ 2/9 Scores 02/25/2015 02/10/2015 01/30/2015 01/01/2015 12/13/2014 12/04/2014 12/02/2014  PHQ - 2 Score 0 0 0 0 0 0 0  PHQ- 9 Score - - - - - - -  Exception Documentation Medical reason - - - - - -    Assessment:   CSW was able to meet with patient today to perform routine home visit.  Patient was pleased to announce that he was able to borrow $10,000.00 dollars from his son, [name] father-in-law to help pay for moving expenses and get his wife, [name] set up in her own apartment.  Patient has not received the check in the mail yet, but reported that he has been granted approval for the money.  Patient is now ready to really begin discussing long-term care placement options for himself, but will also need assistance obtaining an affordable moving company and a list of low-rent apartments for [name].  CSW and patient will begin their research and bring options for discussion during the next scheduled home visit, which will take  place on Thursday, August 18th at 12:00pm.  Plan:   CSW will meet with patient on Thursday, August 18th at 12:00pm to perform routine home visit to assist patient with long-term care placement options.  Nat Christen, BSW, MSW, [facility] [place], [place], [place] [address] Di [email or web address] [phone]

## 2015-03-05 ENCOUNTER — Other Ambulatory Visit: Payer: Self-pay | Admitting: *Deleted

## 2015-03-05 NOTE — Patient Outreach (Signed)
[facility]) Care Management  03/05/2015  RAYMIR FROMMELT [birth year] [id number]   CSW was scheduled to meet with patient today to perform a routine home visit; however, when CSW arrived at patient's home, patient reported to [place] that he would need to reschedule the visit.  Patient appeared to be somewhat agitated, reporting that his wife was home and invited company over to visit. Patient always tries to schedule his home visits while his wife, Jayvion Stefanski is at the gym, because patient does not like to be disturbed.  Patient apologized for needing to cancel the visit, indicating that he would have called to cancel with CSW before having CSW drive "all that way", but he was hoping that Mrs. Tappan would leave so that she could go to the gym.  Unfortunately, that did not happen.  CSW and patient agreed to meet on Monday, August 22nd at 9:00am.  Patient agreed to contact CSW if anything changes.  Nat Christen, BSW, MSW, [facility] [place], [place], [place] [address] [email or web address] [phone]

## 2015-03-09 ENCOUNTER — Other Ambulatory Visit: Payer: Self-pay | Admitting: *Deleted

## 2015-03-09 DIAGNOSIS — J449 Chronic obstructive pulmonary disease, unspecified: Secondary | ICD-10-CM | POA: Diagnosis not present

## 2015-03-10 ENCOUNTER — Encounter: Payer: Self-pay | Admitting: *Deleted

## 2015-03-10 NOTE — Patient Outreach (Signed)
[name]) Care Management  [name] Social Work  03/10/2015  [name] 1934-04-24 [id number]  Subjective:    "I desperately need to have cataracts removed from both eyes, but can't afford the expense".  Objective:   CSW will assist patient with trying to obtain financial assistance to help pay for eye examination, as well as cataract surgeries.  Current Medications:  Current Outpatient Prescriptions  Medication Sig Dispense Refill  . Albuterol Sulfate (PROAIR RESPICLICK) 938 (90 BASE) MCG/ACT AEPB Inhale 2 puffs into the lungs every 6 (six) hours as needed. 1 each 11  . ALPRAZolam (XANAX) 0.5 MG tablet Take 1 tablet (0.5 mg total) by mouth at bedtime as needed for anxiety. 30 tablet 0  . budesonide-formoterol (SYMBICORT) 160-4.5 MCG/ACT inhaler Take 2 puffs first thing in am and then another 2 puffs about 12 hours later. (Patient taking differently: Inhale 2 puffs into the lungs 2 (two) times daily. Take 2 puffs first thing in am and then another 2 puffs about 12 hours later.) 1 Inhaler 11  . citalopram (CELEXA) 20 MG tablet Take 20 mg by mouth daily.    Marland Kitchen guaiFENesin (MUCINEX) 600 MG 12 hr tablet Take 1 tablet (600 mg total) by mouth 2 (two) times daily. (Patient not taking: Reported on 09/13/2014) 10 tablet 0  . ipratropium-albuterol (DUONEB) 0.5-2.5 (3) MG/3ML SOLN Take 3 mLs by nebulization every 6 (six) hours as needed (increased / severe wheezing , perisstent shortness of breath). 360 mL 0  . losartan (COZAAR) 50 MG tablet Take 1 tablet (50 mg total) by mouth daily. (Patient not taking: Reported on 09/13/2014) 30 tablet 0  . ondansetron (ZOFRAN) 4 MG tablet Take 1 tablet (4 mg total) by mouth every 6 (six) hours. (Patient not taking: Reported on 10/24/2014) 12 tablet 0  . oxyCODONE-acetaminophen (PERCOCET/ROXICET) 5-325 MG per tablet Take 1-2 tablets by mouth every 6 (six) hours as needed. 30 tablet 0   No current facility-administered medications for this visit.     Functional Status:  In your present state of health, do you have any difficulty performing the following activities: 02/26/2015 02/10/2015  Hearing? [name]  Vision? Y N  Difficulty concentrating or making decisions? [name]  Walking or climbing stairs? Y Y  Dressing or bathing? N N  Doing errands, shopping? [name]  Preparing Food and eating ? Y Y  Using the Toilet? N N  In the past six months, have you accidently leaked urine? N N  Do you have problems with loss of bowel control? N N  Managing your Medications? N N  Managing your Finances? [name]  Housekeeping or managing your Housekeeping? [name]    Fall/Depression Screening:  PHQ 2/9 Scores 02/25/2015 02/10/2015 01/30/2015 01/01/2015 12/13/2014 12/04/2014 12/02/2014  PHQ - 2 Score 0 0 0 0 0 0 0  PHQ- 9 Score - - - - - - -  Exception Documentation Medical reason - - - - - -    Assessment:   CSW was able to meet with patient today to perform routine home visit.  Patient was sitting in the living room in his wingback chair when CSW arrived.  Patient appeared to be in good spirits today, but admitted to experiencing a little depression earlier in the week.  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  In other words, patient indicated that he is continuing to have to borrow money each month just to pay the rent and other monthly expenses.  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  Patient has still not received the $10,000.00 that his son, [name] father-in-law has agreed to provide him, in exchange for the father-in-law being the sole beneficiary of patient's life insurance policy, worth $65,681.27.  Patient hopes to meet with the father-in-law during the week of September 1st, when he is [place], to finalize the deal. Once the money is received, patient would like to pay a moving company to place all of their items in storage, get his wife, [name]  set up in her own apartment and move himself into a one-bedroom apartment or boarding house.  Any money left over will be used by the patient to live off of, while [name] continues to receive patient's entire Social Security Income to  live independently and sustain current lifestyle.  Patient admitted that he is a little resentful of [name] at present, as she recently received $525.00 for dog sitting, refusing to even pay a water bill, worth $30.00.  Patient stated, "She believes that since she earned it, she should be able to spend it".  CSW is aware that [name] has never been employed, always relying solely on patient to pay the bills, still to this day. While present, CSW and patient were able to schedule patient a follow-up appointment with his Primary Care Physician, [name].  The appointment is scheduled for Friday, August 26th at 4:00pm.  CSW has agreed to attend the appointment with patient.  During the appointment, patient will speak with [name] about the increased pain in his back, which patient believes is radiating from his right lung.  CSW and patient also hope to accomplish the following:   Completion of Special Assistance/Medical Alert application through [name] to ensure that patient does not experience power outage due to chronic medical condition. Completion of application for Medical Necessity for patient to qualify for a [name] Portable Oxygen Tank through [name]. Patient indicated that he really needs to have his eyes examined through his Opthalmologist, [name], as [name] told patient one year ago that he needed to have cataracts removed from both eyes, as soon as possible.  Patient admitted that his vision has gotten progressively worse and that his left eye is now starting to bother him.  Patient has already spoken with his eye surgeon, [name], who has agreed to provide cataract surgery for patient, charging $195.00  per eye.  CSW agreed to contact [name], Representative with the [employer] [phone]), to see if there are any funds available to assist patient with the eye surgeries.  Plan:   CSW will plan to attend patient's Primary Care Physician appointment with [name], scheduled for Friday, August 26th at 4:00pm.  [name], BSW, MSW, [facility] [place], [place], [place] [address] [email or web address] [phone]

## 2015-03-13 ENCOUNTER — Other Ambulatory Visit: Payer: Self-pay | Admitting: *Deleted

## 2015-03-13 DIAGNOSIS — F329 Major depressive disorder, single episode, unspecified: Secondary | ICD-10-CM | POA: Diagnosis not present

## 2015-03-13 DIAGNOSIS — J449 Chronic obstructive pulmonary disease, unspecified: Secondary | ICD-10-CM | POA: Diagnosis not present

## 2015-03-13 DIAGNOSIS — N183 Chronic kidney disease, stage 3 (moderate): Secondary | ICD-10-CM | POA: Diagnosis not present

## 2015-03-13 DIAGNOSIS — I10 Essential (primary) hypertension: Secondary | ICD-10-CM | POA: Diagnosis not present

## 2015-03-13 DIAGNOSIS — E782 Mixed hyperlipidemia: Secondary | ICD-10-CM | POA: Diagnosis not present

## 2015-03-13 NOTE — Patient Outreach (Signed)
[facility]) Care Management  03/13/2015  AEON KESSNER 1933/10/04 [id number]   CSW was able to meet with patient today to perform a routine office visit.  Patient attended his follow-up appointment with his Primary Care Physician, Dr. Seward Carol, scheduled for Friday, August 26th at 4:30pm. During the appointment, patient spoke with Dr. Delfina Redwood about the increased pain he is experiencing in his back, which patient believes is radiating from his right lung. Dr. Delfina Redwood was able to listen to patient's lungs.  Patient was able to obtain a prescription for his pain medication; OXYCODONE-ACETAMINOPHEN (PERCOCET/ROXICET) 5-325 MG per tablet.   CSW and patient were also able to accomplish the following tasks:  Completion of Special Assistance/Medical Alert application through [employer] to ensure that patient does not experience power outage due to his chronic medical condition. Completion of application for Medical Necessity for patient to qualify for a [place] Portable Oxygen Tank through [place].  Dr. Delfina Redwood was able to fill out all necessary paperwork and sign off for processing. CSW explained to patient that CSW was able to converse with a representative from Dr. Charlann Lange office, who reports that Dr. Joaquin Bend does not offer pro bono services for his indigent clients.  However, CSW was able to make contact with Loni Beckwith, Representative with the [employer], who reports that there may be some monies available to assist patient with the expense of cataract surgery.  Mr. Eyvonne Mechanic admitted that his agency would not be able to cover the total expense ($390.00, $195.00 per eye), but that he is willing to request some funding.  Mr. Eyvonne Mechanic agreed to contact CSW during the week of August 29th to report findings.  CSW will then follow-up with patient to discuss further arrangements. Patient reported that he is still on scheduled to meet with his son's father-in-law  the week of September 1st to obtain the $10,000.00.  Patient will contact CSW as soon as the money is in his possession.  CSW will make arrangements to meet with patient on Wednesday, September 7th at 10:00am to perform the next routine home visit.  Nat Christen, BSW, MSW, [facility] [place], [place], [place] [address] [email or web address] [phone]

## 2015-03-20 ENCOUNTER — Encounter: Payer: Self-pay | Admitting: *Deleted

## 2015-03-25 ENCOUNTER — Other Ambulatory Visit: Payer: Self-pay | Admitting: *Deleted

## 2015-03-25 ENCOUNTER — Encounter: Payer: Self-pay | Admitting: *Deleted

## 2015-03-25 NOTE — Patient Outreach (Signed)
[facility]) Care Management  [facility] Social Work  03/25/2015  [name] 07-24-1933 [id number]  Subjective:    "I'm not going to receive the $10,000.00 from my son's father-in-law, as originally promised, so now I need to come up with alternative options to help pay moving expenses".  Objective:   CSW agreed to explore new housing options with patient, as well as community agencies and resources that may be able to assist with moving expenses.  Current Medications:  Current Outpatient Prescriptions  Medication Sig Dispense Refill  . Albuterol Sulfate (PROAIR RESPICLICK) 409 (90 BASE) MCG/ACT AEPB Inhale 2 puffs into the lungs every 6 (six) hours as needed. 1 each 11  . ALPRAZolam (XANAX) 0.5 MG tablet Take 1 tablet (0.5 mg total) by mouth at bedtime as needed for anxiety. 30 tablet 0  . budesonide-formoterol (SYMBICORT) 160-4.5 MCG/ACT inhaler Take 2 puffs first thing in am and then another 2 puffs about 12 hours later. (Patient taking differently: Inhale 2 puffs into the lungs 2 (two) times daily. Take 2 puffs first thing in am and then another 2 puffs about 12 hours later.) 1 Inhaler 11  . citalopram (CELEXA) 20 MG tablet Take 20 mg by mouth daily.    Marland Kitchen guaiFENesin (MUCINEX) 600 MG 12 hr tablet Take 1 tablet (600 mg total) by mouth 2 (two) times daily. (Patient not taking: Reported on 09/13/2014) 10 tablet 0  . ipratropium-albuterol (DUONEB) 0.5-2.5 (3) MG/3ML SOLN Take 3 mLs by nebulization every 6 (six) hours as needed (increased / severe wheezing , perisstent shortness of breath). 360 mL 0  . losartan (COZAAR) 50 MG tablet Take 1 tablet (50 mg total) by mouth daily. (Patient not taking: Reported on 09/13/2014) 30 tablet 0  . ondansetron (ZOFRAN) 4 MG tablet Take 1 tablet (4 mg total) by mouth every 6 (six) hours. (Patient not taking: Reported on 10/24/2014) 12 tablet 0  . oxyCODONE-acetaminophen (PERCOCET/ROXICET) 5-325 MG per tablet Take 1-2 tablets by mouth every 6  (six) hours as needed. 30 tablet 0   No current facility-administered medications for this visit.    Functional Status:  In your present state of health, do you have any difficulty performing the following activities: 03/25/2015 03/10/2015  Hearing? [name]  Vision? Y Y  Difficulty concentrating or making decisions? [name]  Walking or climbing stairs? Y Y  Dressing or bathing? N N  Doing errands, shopping? [name]  Preparing Food and eating ? Y Y  Using the Toilet? N N  In the past six months, have you accidently leaked urine? N N  Do you have problems with loss of bowel control? N N  Managing your Medications? N N  Managing your Finances? [name]  Housekeeping or managing your Housekeeping? [name]    Fall/Depression Screening:  PHQ 2/9 Scores 03/25/2015 03/10/2015 02/25/2015 02/10/2015 01/30/2015 01/01/2015 12/13/2014  PHQ - 2 Score 0 0 0 0 0 0 0  PHQ- 9 Score - - - - - - -  Exception Documentation - - Medical reason - - - -    Assessment:   CSW was able to meet with patient today to perform a routine home visit, but only briefly.  CSW was greeted by patient at the front door, who directed CSW into his office area.  Patient reported that his wife, [name] is sick, experiencing symptoms of nausea, vomiting and diarrhea.  CSW immediately became alarmed, explaining to patient that his immune system is already compromised and that  he cannot afford to get sick.  Patient voiced understanding, but admitted that there is no one available to take care of Mrs. Willert.  CSW inquired about patient's daughter, as CSW is aware that patient's daughter receives full disability benefits and does not work due to a psychiatric illness.  Patient reported that his daughter would not be available to care for Mrs. Aldana, as his daughter is a Engineer, agricultural, not willing to go without a cigarette for any length of time".  CSW then inquired as to whether or not patient would be able to stay with his son, having his daughter-in-law  care for Mrs. Enis, just until she is well.  Again, patient denied.  Patient was encouraged to limit his interaction with Mrs. Castorena and wash his hands thoroughly while offering assistance of any kind. Patient was able to meet with his son's father-in-law, for a second time, on Tuesday, September 6th.  Patient was hoping that the visit would consist of him being able to collect the $10,000.00 that his son's father-in-law agreed to loan him.  However, patient was wrong, expressing a great deal of frustration and disappointment.  Patient went on to say that his son's father-in-law agreed to gift him $5,000.00, but that he would not be able to receive the money until Thanksgiving.  The father-in-law was not willing to provide patient with $10,000.00, despite patient agreeing to take out a $10,000.00 life insurance policy on himself, making him the sole beneficiary.  Patient tried explaining his dilemma to his son's father-in-law, but he continued to refuse.  Patient was finally able to get his son's father-in-law to admit that he would not loan him $10,000.00 because he knew that $5,000.00 of it would go to Mrs. Tenpenny, with whom they do not like or respect.  Patient reported, "I explained to them that I have been married to her for 54 years, I can't just up and move, leaving her high and dry".  CSW voiced understanding, agreeing with patient's decision to ensure that his wife has secure living arrangements, along with himself. Due to the nature of Mrs. Schwebke's illness, CSW and patient agreed to reschedule the home visit for a later date.  In the meantime, patient reported that he will begin pursuing other options for obtaining money for moving expenses.  CSW also agreed to do the same.  CSW indicated that she is still waiting to hear back from Loni Beckwith with the [employer] to see if there is any money available, either through their agency or a [employer], to help pay for patient's  cataract surgery.  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  Patient also reported that he plans to "hound" his son's father-in-law for the $10,000.00, as he believes that this gentleman is his last resort.    Plan:   CSW and patient agreed to meet for the next routine home visit on Wednesday, September 21st at 10:00am.  Nat Christen, BSW, MSW, LCSW  Licensed Clinical Social Worker  [place]  Mailing [place] [address], [place], [place] [address] Physical Address-[address], [place], [place] [address] Toll Free Main # [phone] Fax # [phone] Cell # [phone]  Fax # [phone]  [email or web address]

## 2015-04-08 ENCOUNTER — Encounter: Payer: Self-pay | Admitting: *Deleted

## 2015-04-08 ENCOUNTER — Other Ambulatory Visit: Payer: Self-pay | Admitting: *Deleted

## 2015-04-08 NOTE — Patient Outreach (Signed)
  [facility]) Care Management  [facility] Social Work  04/08/2015  [name] February 13, 1934 [id number]  Subjective:    Objective:   Current Medications:  Current Outpatient Prescriptions  Medication Sig Dispense Refill  . Albuterol Sulfate (PROAIR RESPICLICK) 725 (90 BASE) MCG/ACT AEPB Inhale 2 puffs into the lungs every 6 (six) hours as needed. 1 each 11  . ALPRAZolam (XANAX) 0.5 MG tablet Take 1 tablet (0.5 mg total) by mouth at bedtime as needed for anxiety. 30 tablet 0  . budesonide-formoterol (SYMBICORT) 160-4.5 MCG/ACT inhaler Take 2 puffs first thing in am and then another 2 puffs about 12 hours later. (Patient taking differently: Inhale 2 puffs into the lungs 2 (two) times daily. Take 2 puffs first thing in am and then another 2 puffs about 12 hours later.) 1 Inhaler 11  . citalopram (CELEXA) 20 MG tablet Take 20 mg by mouth daily.    Marland Kitchen guaiFENesin (MUCINEX) 600 MG 12 hr tablet Take 1 tablet (600 mg total) by mouth 2 (two) times daily. (Patient not taking: Reported on 09/13/2014) 10 tablet 0  . ipratropium-albuterol (DUONEB) 0.5-2.5 (3) MG/3ML SOLN Take 3 mLs by nebulization every 6 (six) hours as needed (increased / severe wheezing , perisstent shortness of breath). 360 mL 0  . losartan (COZAAR) 50 MG tablet Take 1 tablet (50 mg total) by mouth daily. (Patient not taking: Reported on 09/13/2014) 30 tablet 0  . ondansetron (ZOFRAN) 4 MG tablet Take 1 tablet (4 mg total) by mouth every 6 (six) hours. (Patient not taking: Reported on 10/24/2014) 12 tablet 0  . oxyCODONE-acetaminophen (PERCOCET/ROXICET) 5-325 MG per tablet Take 1-2 tablets by mouth every 6 (six) hours as needed. 30 tablet 0   No current facility-administered medications for this visit.    Functional Status:  In your present state of health, do you have any difficulty performing the following activities: 04/08/2015 03/25/2015  Hearing? [name]  Vision? Y Y  Difficulty concentrating or making decisions? [name]   Walking or climbing stairs? Y Y  Dressing or bathing? N N  Doing errands, shopping? [name]  Preparing Food and eating ? Y Y  Using the Toilet? N N  In the past six months, have you accidently leaked urine? N N  Do you have problems with loss of bowel control? N N  Managing your Medications? N N  Managing your Finances? [name]  Housekeeping or managing your Housekeeping? [name]    Fall/Depression Screening:  PHQ 2/9 Scores 04/08/2015 03/25/2015 03/10/2015 02/25/2015 02/10/2015 01/30/2015 01/01/2015  PHQ - 2 Score 0 0 0 0 0 0 0  PHQ- 9 Score - - - - - - -  Exception Documentation - - - Medical reason - - -    Assessment:   Plan:

## 2015-04-09 DIAGNOSIS — J449 Chronic obstructive pulmonary disease, unspecified: Secondary | ICD-10-CM | POA: Diagnosis not present

## 2015-04-09 NOTE — Patient Outreach (Signed)
[facility]) Care Management  [facility] Social Work  04/09/2015  [name] 08/24/1933 [id number]  Subjective:    " I need to sit down with [name] to discuss our finances".  Objective:   CSW agreed to spend the visit with patient discussing ways in which patient can engage his wife, Maximum Reiland in monthly bill payment exercises.  Current Medications:  Current Outpatient Prescriptions  Medication Sig Dispense Refill  . Albuterol Sulfate (PROAIR RESPICLICK) 706 (90 BASE) MCG/ACT AEPB Inhale 2 puffs into the lungs every 6 (six) hours as needed. 1 each 11  . ALPRAZolam (XANAX) 0.5 MG tablet Take 1 tablet (0.5 mg total) by mouth at bedtime as needed for anxiety. 30 tablet 0  . budesonide-formoterol (SYMBICORT) 160-4.5 MCG/ACT inhaler Take 2 puffs first thing in am and then another 2 puffs about 12 hours later. (Patient taking differently: Inhale 2 puffs into the lungs 2 (two) times daily. Take 2 puffs first thing in am and then another 2 puffs about 12 hours later.) 1 Inhaler 11  . citalopram (CELEXA) 20 MG tablet Take 20 mg by mouth daily.    Marland Kitchen guaiFENesin (MUCINEX) 600 MG 12 hr tablet Take 1 tablet (600 mg total) by mouth 2 (two) times daily. (Patient not taking: Reported on 09/13/2014) 10 tablet 0  . ipratropium-albuterol (DUONEB) 0.5-2.5 (3) MG/3ML SOLN Take 3 mLs by nebulization every 6 (six) hours as needed (increased / severe wheezing , perisstent shortness of breath). 360 mL 0  . losartan (COZAAR) 50 MG tablet Take 1 tablet (50 mg total) by mouth daily. (Patient not taking: Reported on 09/13/2014) 30 tablet 0  . ondansetron (ZOFRAN) 4 MG tablet Take 1 tablet (4 mg total) by mouth every 6 (six) hours. (Patient not taking: Reported on 10/24/2014) 12 tablet 0  . oxyCODONE-acetaminophen (PERCOCET/ROXICET) 5-325 MG per tablet Take 1-2 tablets by mouth every 6 (six) hours as needed. 30 tablet 0   No current facility-administered medications for this visit.    Functional  Status:  In your present state of health, do you have any difficulty performing the following activities: 04/08/2015 03/25/2015  Hearing? [name]  Vision? Y Y  Difficulty concentrating or making decisions? [name]  Walking or climbing stairs? Y Y  Dressing or bathing? N N  Doing errands, shopping? [name]  Preparing Food and eating ? Y Y  Using the Toilet? N N  In the past six months, have you accidently leaked urine? N N  Do you have problems with loss of bowel control? N N  Managing your Medications? N N  Managing your Finances? [name]  Housekeeping or managing your Housekeeping? [name]    Fall/Depression Screening:  PHQ 2/9 Scores 04/08/2015 03/25/2015 03/10/2015 02/25/2015 02/10/2015 01/30/2015 01/01/2015  PHQ - 2 Score 0 0 0 0 0 0 0  PHQ- 9 Score - - - - - - -  Exception Documentation - - - Medical reason - - -    Assessment:   CSW was able to meet with patient today to perform the routine home visit.  Patient appeared to be in good spirits today, indicating that he was approved for the [employer] Portable Oxygen Tank through [facility].  CSW and patient talked about meeting at the park near patient's home for the next scheduled home visit.  Patient admitted that his leg muscles have "turned to mush" and that he needs to build back up his strength and endurance.  CSW agreed to meet patient  at the track to assist with patient's exercise regimen.  Patient fears that he will use the ability to walk if he does not begin an exercise routine, as patient complains of pain and muscle weakness in the lower portion of his body.    CSW and patient spent a great deal of the visit discussing ways in which he can engage his wife, Rendell Thivierge in bill payment each month.  Patient feels the need to begin educating patient on income earned versus monthly expenses.  Patient reported, "[name] would not have a clue what to do if something were to happen to me".  Patient went on to say, "[name] doesn't know how I have to hustle each  month to try and make ends meet, she just continues to ask for money for frivolous things". CSW and patient talked about ways to broach the subject with Mrs. Eilts to begin engaging her.  Plan:   CSW will plan to meet with patient for the next routine home visit on Wednesday, October 26th at 11:00am. CSW will fax a correspondence letter to patient's Primary Care Physician, Dr. Seward Carol to report findings of home visit with patient today.  Nat Christen, BSW, MSW, LCSW  Licensed Education officer, environmental [facility] System  Mailing [place] [address] [place], [place] [address] Physical Address-[address], [place], [place] [address] Toll Free Main # [phone] Fax # [phone] Cell # [phone]  Fax # [phone]  [email or web address]

## 2015-04-13 ENCOUNTER — Encounter: Payer: Self-pay | Admitting: Cardiothoracic Surgery

## 2015-04-13 DIAGNOSIS — J449 Chronic obstructive pulmonary disease, unspecified: Secondary | ICD-10-CM | POA: Diagnosis not present

## 2015-04-13 DIAGNOSIS — I10 Essential (primary) hypertension: Secondary | ICD-10-CM | POA: Diagnosis not present

## 2015-04-21 ENCOUNTER — Encounter: Payer: Self-pay | Admitting: *Deleted

## 2015-04-22 ENCOUNTER — Other Ambulatory Visit: Payer: Self-pay | Admitting: *Deleted

## 2015-04-22 DIAGNOSIS — R911 Solitary pulmonary nodule: Secondary | ICD-10-CM

## 2015-05-09 DIAGNOSIS — J449 Chronic obstructive pulmonary disease, unspecified: Secondary | ICD-10-CM | POA: Diagnosis not present

## 2015-05-13 ENCOUNTER — Encounter: Payer: Self-pay | Admitting: Cardiothoracic Surgery

## 2015-05-13 ENCOUNTER — Other Ambulatory Visit: Payer: Self-pay | Admitting: *Deleted

## 2015-05-13 ENCOUNTER — Ambulatory Visit (INDEPENDENT_AMBULATORY_CARE_PROVIDER_SITE_OTHER): Payer: Commercial Managed Care - HMO | Admitting: Cardiothoracic Surgery

## 2015-05-13 ENCOUNTER — Other Ambulatory Visit: Payer: Self-pay

## 2015-05-13 ENCOUNTER — Ambulatory Visit
Admission: RE | Admit: 2015-05-13 | Discharge: 2015-05-13 | Disposition: A | Discharge status: Home / Self Care | Payer: Commercial Managed Care - HMO | Source: Ambulatory Visit | Attending: Cardiothoracic Surgery | Admitting: Cardiothoracic Surgery

## 2015-05-13 ENCOUNTER — Ambulatory Visit: Payer: Self-pay | Admitting: *Deleted

## 2015-05-13 VITALS — BP 125/90 | HR 100 | Resp 20 | Ht 71.0 in

## 2015-05-13 DIAGNOSIS — R911 Solitary pulmonary nodule: Secondary | ICD-10-CM

## 2015-05-13 DIAGNOSIS — R918 Other nonspecific abnormal finding of lung field: Secondary | ICD-10-CM

## 2015-05-13 DIAGNOSIS — D381 Neoplasm of uncertain behavior of trachea, bronchus and lung: Secondary | ICD-10-CM

## 2015-05-13 DIAGNOSIS — J449 Chronic obstructive pulmonary disease, unspecified: Secondary | ICD-10-CM

## 2015-05-13 DIAGNOSIS — Z23 Encounter for immunization: Secondary | ICD-10-CM | POA: Diagnosis not present

## 2015-05-13 DIAGNOSIS — I10 Essential (primary) hypertension: Secondary | ICD-10-CM | POA: Diagnosis not present

## 2015-05-13 NOTE — Progress Notes (Signed)
[facility] Record #[id number] Date of Birth: 04-19-1934  Referring: Seward Carol, MD Primary Care: Kandice Hams, MD  Chief Complaint: left  lung nodule  History of Present Illness:    [name] 81 y.o. male is seen in the office  today for severe COPD oxygen dependent an incidental finding of a less than 1 cm left lung nodule  . The patient was originally seen approximately two   year ago .The patient's had at least 10 year history of severe COPD oxygen dependent, very limited  Functionally due to shortness of breath. He fell in October 2014 and chest x-ray in the emergency room suggested a left lung lesion, CT scan and PET scan were performed and the patient was referred to the [facility] clinic for further evaluation. Followup CT scans were done in the spring  And fall of 2015 and her patient returns today with a followup CT scan.   He continues to be very limited due to his pulmonary reserve, on home oxygen.  Current Activity/ Functional Status:  Patient is independent with mobility/ambulation, transfers, ADL's, IADL's.  Zubrod Score: At the time of surgery this patient's most appropriate activity status/level should be described as: []  Normal activity, no symptoms []  Symptoms, fully ambulatory [x]  Symptoms, in bed less than or equal to 50% of the time []  Symptoms, in bed greater than 50% of the time but less than 100% []  Bedridden []  Moribund   Past Medical History  Diagnosis Date  . Asthma   . COPD (chronic obstructive pulmonary disease) ([place])   . Emphysema of lung ([place])   . Hypertension   . On home O2     3L N/C continuous  . Multiple lung nodules   . Shortness of breath dyspnea   . Anxiety   . Depression   . Oxygen deficiency     Past Surgical History  Procedure Laterality Date  . Lump removed from neck  2004  . Hernia repair  2008    No family history on file.  Social History   Social History  .  Marital Status: Married    Spouse Name: N/A  . Number of Children: N/A  . Years of Education: N/A   Occupational History  . Not on file.   Social History Main Topics  . Smoking status: Former Smoker -- 1.50 packs/day for 50 years    Types: Cigarettes    Quit date: 09/06/2002  . Smokeless tobacco: Never Used  . Alcohol Use: No  . Drug Use: No  . Sexual Activity: No   Other Topics Concern  . Not on file   Social History Narrative    History  Smoking status  . Former Smoker -- 1.50 packs/day for 50 years  . Types: Cigarettes  . Quit date: 09/06/2002  Smokeless tobacco  . Never Used    History  Alcohol Use No     No Known Allergies  Current Outpatient Prescriptions  Medication Sig Dispense Refill  . Albuterol Sulfate (PROAIR RESPICLICK) 342 (90 BASE) MCG/ACT AEPB Inhale 2 puffs into the lungs every 6 (six) hours as needed. 1 each 11  . ALPRAZolam (XANAX) 0.5 MG tablet Take 1 tablet (0.5 mg total) by mouth at bedtime as needed for anxiety. 30 tablet 0  . budesonide-formoterol (SYMBICORT) 160-4.5 MCG/ACT inhaler Take 2 puffs first thing in  am and then another 2 puffs about 12 hours later. (Patient taking differently: Inhale 2 puffs into the lungs 2 (two) times daily. Take 2 puffs first thing in am and then another 2 puffs about 12 hours later.) 1 Inhaler 11  . citalopram (CELEXA) 20 MG tablet Take 20 mg by mouth daily.    Marland Kitchen guaiFENesin (MUCINEX) 600 MG 12 hr tablet Take 1 tablet (600 mg total) by mouth 2 (two) times daily. 10 tablet 0  . ipratropium-albuterol (DUONEB) 0.5-2.5 (3) MG/3ML SOLN Take 3 mLs by nebulization every 6 (six) hours as needed (increased / severe wheezing , perisstent shortness of breath). 360 mL 0  . losartan (COZAAR) 50 MG tablet Take 1 tablet (50 mg total) by mouth daily. 30 tablet 0  . oxyCODONE-acetaminophen (PERCOCET/ROXICET) 5-325 MG per tablet Take 1-2 tablets by mouth every 6 (six) hours as needed. 30 tablet 0  . ondansetron (ZOFRAN) 4 MG tablet  Take 1 tablet (4 mg total) by mouth every 6 (six) hours. (Patient not taking: Reported on 05/13/2015) 12 tablet 0   No current facility-administered medications for this visit.       Review of Systems:     Cardiac Review of Systems: Y or N  Chest Pain [  n  ]  Resting SOB Blue.Reese   ] Exertional SOB  [ y ]  Vertell Limber Florencio.Farrier  ]   Pedal Edema [  n ]    Palpitations [ n ] Syncope  [ n ]   Presyncope [ n  ]  General Review of Systems: [Y] = yes [  ]=no Constitional: recent weight change [  ];  Wt loss over the last 3 months [n   ] anorexia [  ]; fatigue [  ]; nausea [  ]; night sweats [  ]; fever [ n]; or chills [n  ];          Dental: poor dentition[  ]; Last Dentist visit:   Eye : blurred vision [  ]; diplopia [   ]; vision changes [  ];  Amaurosis fugax[  ]; Resp: cough Blue.Reese  ];  wheezing[y  ];  hemoptysis[n  ]; shortness of breath[ y ]; paroxysmal nocturnal dyspnea[ y ]; dyspnea on exertion[y  ]; or orthopnea[  ];  GI:  Gallstones[known on ct  ], vomiting[  ];  dysphagia[  ]; melena[  ];  hematochezia [  ]; heartburn[  ];   Hx of  Colonoscopy[  ]; GU: kidney stones [  ]; hematuria[  ];   dysuria [  ];  nocturia[  ];  history of     obstruction [  ]; urinary frequency [  ]             Skin: rash, swelling[  ];, hair loss[  ];  peripheral edema[  ];  or itching[  ]; Musculosketetal: myalgias[  ];  joint swelling[  ];  joint erythema[  ];  joint pain[  ];  back pain[  ];  Heme/Lymph: bruising[  ];  bleeding[  ];  anemia[  ];  Neuro: TIA[  ];  headaches[  ];  stroke[  ];  vertigo[  ];  seizures[  ];   paresthesias[  ];  difficulty walking[ sob ];  Psych:depression[  ]; anxiety[  ];  Endocrine: diabetes[  ];  thyroid dysfunction[  ];  Immunizations: Flu up to date [not this year  ]; Pneumococcal up to date [ y ];  Other:  Physical Exam:  BP 125/90 mmHg  Pulse 100  Resp 20  Ht 5\' 11"  (1.803 m)  SpO2 93%  Patient remains on oxygen 3 L when at rest and turned to 5 with exertion  General appearance:  alert, cooperative, appears older than stated age, fatigued and mild distress Neurologic: intact Heart: regular rate and rhythm, S1, S2 normal, no murmur, click, rub or gallop Lungs: diminished breath sounds bilaterally Abdomen: soft, non-tender; bowel sounds normal; no masses,  no organomegaly Extremities: extremities normal, atraumatic, no cyanosis or edema and Homans sign is negative, no sign of DVT Patient has no carotid bruits Has no cervical or supraclavicular adenopathy Patient has easy bruisability of his arms  Diagnostic Studies & Laboratory data:     Recent Radiology Findings:  Ct Chest Wo Contrast  05/13/2015  CLINICAL DATA:  Followup lung nodules EXAM: CT CHEST WITHOUT CONTRAST TECHNIQUE: Multidetector CT imaging of the chest was performed following the standard protocol without IV contrast. COMPARISON:  06/10/2014 FINDINGS: Mediastinum: Heart size is normal. No pericardial effusion identified. Aortic atherosclerosis noted. Similar appearance of lipomatosis hypertrophy of the interatrial septum, image 42 of series 3. No mediastinal or hilar adenopathy. The trachea appears normal and patent. Normal appearance of the esophagus. No enlarged supraclavicular or axillary lymph nodes. Lungs/Pleura: There is no pleural fluid identified. Moderate to advanced changes of centrilobular and paraseptal emphysema identified. Calcified granulomas noted in the left upper lobe, image 32 of series 4. There is an adjacent noncalcified nodule which is unchanged measuring 5 mm, image 28 of series 4. This nodule has been stable since 05/22/2013. Twenty-four months of stability are compatible with benign process. No new or enlarging suspicious nodules or masses identified. Upper Abdomen: No focal liver abnormality. Multiple stones identified within the gallbladder. The adrenal glands appear normal. Cyst is noted arising from the upper pole of right kidney. Musculoskeletal: No aggressive lytic or sclerotic bone  lesion identified. The bones appear diffusely osteopenic and there is a marked kyphosis deformity involving the upper thoracic spine. IMPRESSION: 1. No acute findings. 2. Stable left upper lobe pulmonary nodule compatible with a benign process. 3. Emphysema 4. Aortic atherosclerosis 5. Thoracic kyphosis. Electronically Signed   By: Kerby Moors M.D.   On: 05/13/2015 14:48   Ct Chest Wo Contrast  04/17/2014   CLINICAL DATA:  Followup indeterminate pulmonary nodules. COPD. Shortness of breath. Former smoker.  EXAM: CT CHEST WITHOUT CONTRAST  TECHNIQUE: Multidetector CT imaging of the chest was performed following the standard protocol without IV contrast.  COMPARISON:  09/12/2013 and 05/22/2013  FINDINGS: Mediastinum/Hilar Regions: No masses or pathologically enlarged lymph nodes identified.  Other Thoracic Lymphadenopathy:  None.  Lungs: Severe emphysema again noted. Multiple sub-cm left lung nodules remains stable, largest within the anterior left upper lobe measuring 7 mm on image 28. No new or enlarging pulmonary nodules or masses are identified.  Pleura:  No evidence of effusion or mass.  Vascular/Cardiac: No thoracic aortic aneurysm or other significant abnormality identified.  Musculoskeletal:  No suspicious bone lesions identified.  Other:  Tiny nonobstructive right renal calculus incidentally noted.  IMPRESSION: Stable sub-cm left lung nodules.  Stable severe COPD.  No acute findings.   Electronically Signed   By: Earle Gell M.D.   On: 04/17/2014 11:27   Dg Chest 2 View (if Patient Has Fever And/or Copd)  09/12/2013   CLINICAL DATA:  Shortness of breath and cough.  EXAM: CHEST  2 VIEW  COMPARISON:  NM PET IMAGE INITIAL (PI) SKULL BASE TO THIGH  dated 06/04/2013; CT CHEST W/O CM dated 05/22/2013; DG CHEST 2 VIEW dated 05/03/2013  FINDINGS: Mediastinum and hilar structures are normal. Severe interstitial prominence and bullous change noted consistent pulmonary interstitial fibrosis and COPD. Previously  identified left mid lung zone nodule is not well identified on today's examination, reference made to a prior reports. No pneumothorax. No acute bony abnormality .  IMPRESSION: 1. 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 2. Previously identified nodule in the left mid lung field is not well identified on today's examination. This may be secondary to projection. Reference is made to prior reports. If need be for further evaluation repeat CT or PET-CT can be obtained.   Electronically Signed   By: Marcello Moores  Register   On: 09/12/2013 10:03   Ct Angio Chest Pe W/cm &/or Wo Cm  09/12/2013   CLINICAL DATA:  Shortness of breath, question pulmonary embolism, history emphysema, asthma, COPD, hypertension, former smoker  EXAM: CT ANGIOGRAPHY CHEST WITH CONTRAST  TECHNIQUE: Multidetector CT imaging of the chest was performed using the standard protocol during bolus administration of intravenous contrast. Multiplanar CT image reconstructions and MIPs were obtained to evaluate the vascular anatomy.  CONTRAST:  129mL OMNIPAQUE IOHEXOL 350 MG/ML SOLN  COMPARISON:  05/22/2013  FINDINGS: Atherosclerotic calcifications aorta without aneurysm or dissection.  Pulmonary arteries well opacified with scattered respiratory motion artifacts at the lower lobes.  No definite evidence of pulmonary embolism.  Visualized upper abdomen unremarkable.  No thoracic adenopathy.  Severe emphysematous changes with minimal atelectasis in the right middle and right lower lobes.  Minimal interstitial changes at the right base.  Small focus of atelectasis or scarring in left lower lobe image 46.  4 mm diameter lung nodules left apex image 12 and lingula image 35 unchanged.  5 mm lingular nodule image 44 stable.  No acute infiltrate, pleural effusion or pneumothorax.  Bones diffusely demineralized.  Review of the MIP images confirms the above findings.  IMPRESSION: Severe COPD changes with scattered scarring and stable left lung nodules.   No evidence of pulmonary embolism or definite acute intra thoracic process.   Electronically Signed   By: Lavonia Dana M.D.   On: 09/12/2013 13:55    Ct Chest Wo Contrast  05/22/2013   CLINICAL DATA:  New pulmonary nodule seen on chest x-ray of 05/03/2013. Shortness of breath. COPD.  EXAM: CT CHEST WITHOUT CONTRAST  TECHNIQUE: Multidetector CT imaging of the chest was performed following the standard protocol without IV contrast.  COMPARISON:  Chest x-ray dated 05/03/2013 and chest CT dated 04/18/2010  FINDINGS: There is a new spiculated lobulated 16 x 13 x 9 mm nodule in the region of the lingula of the left upper lobe. The finding is consistent with a carcinoma of the lung.  The finding is superimposed on severe emphysema. There are several areas of new oblong nodularity in the left upper lobe which I suspect represent intrapulmonary lymph nodes in areas of new parenchymal scarring.  There is no hilar or mediastinal adenopathy. Heart size is normal. No acute osseous abnormality. The thoracic kyphosis is markedly accentuated.  Scans of the upper abdomen demonstrate multiple gallstones as well as a 11 mm stone in the left renal pelvis. These were present on the prior CT scan abdomen dated 08/28/2012. Minimal dilatation of the left renal collecting system.  IMPRESSION: New spiculated 16 mm mass in the left upper lobe consistent with a carcinoma of the lung.   Electronically Signed   By: Vanita Ingles.D.  On: 05/22/2013 16:32   Nm Pet Image Initial (pi) Skull Base To Thigh  06/04/2013   CLINICAL DATA:  Initial Treatment strategy for left upper lobe lung lesion. Marland Kitchen  EXAM: NUCLEAR MEDICINE PET SKULL BASE TO THIGH  FASTING BLOOD GLUCOSE:  Value: 92mg /dl  TECHNIQUE: 17.2 mCi F-18 FDG was injected intravenously. CT data was obtained and used for attenuation correction and anatomic localization only. (This was not acquired as a diagnostic CT examination.) Additional exam technical data entered on technologist  worksheet.  COMPARISON:  Chest CT 05/22/2013  FINDINGS: NECK  No hypermetabolic lymph nodes in the neck.  CHEST  The lingular spiculated lesion demonstrates FDG uptake with SUV max of 3.6. This is consistent with a primary lung neoplasm. There are several other pulmonary nodules but no definite abnormal FDG uptake. No mediastinal or hilar lymphadenopathy.  ABDOMEN/PELVIS  No abnormal hypermetabolic activity within the liver, pancreas, adrenal glands, or spleen. No hypermetabolic lymph nodes in the abdomen or pelvis.  SKELETON  No findings to suggest osseous metastatic disease. There is slight increased uptake in a right lower anterior with but this has the appearance of a fracture.  IMPRESSION: Lingular nodule demonstrates neoplastic range FDG uptake with SUV max of 3.6.  No mediastinal or hilar lymphadenopathy and no findings for metastatic disease.  Severe emphysematous changes with pulmonary scarring. No other definite FDG positive pulmonary nodules.   Electronically Signed   By: Kalman Jewels M.D.   On: 06/04/2013 13:12      Recent Lab Findings: Lab Results  Component Value Date   WBC 11.8* 09/13/2014   HGB 13.7 09/13/2014   HCT 44.3 09/13/2014   PLT 150 09/13/2014   GLUCOSE 128* 09/13/2014   ALT 14 09/13/2014   AST 22 09/13/2014   NA 141 09/13/2014   K 3.5 09/13/2014   CL 110 09/13/2014   CREATININE 1.37* 09/13/2014   BUN 22 09/13/2014   CO2 26 09/13/2014   TSH 0.889 04/17/2010   PFT's 03/2014   FEV1   0 .77 23% DLCO 5.91 16%   Assessment / Plan:  Stable sub-cm left lung nodules. - By CT scan Patient has severely limiting COPD oxygen dependence and would not be  operative candidate and not eligiable for lung cancer screening clinic Severe Obstructive Airways Disease Severe Diffusion Defect  I have reviewed with the patient the findings of his CT scan of the chest done today. With the small size and stability of the nodules in his severe underlying pulmonary disease I would not  recommend anything more than followup CT. Will plan followup CT of the chest in one year. encouraged to go  today to Dr Delfina Redwood office and get flu shot.   Grace Isaac MD   [address] [place] [address] Office [phone]   Beeper [phone]  05/13/2015 3:07 PM

## 2015-05-14 ENCOUNTER — Encounter: Payer: Self-pay | Admitting: *Deleted

## 2015-05-14 NOTE — Patient Outreach (Signed)
[facility]) Care Management  05/14/2015  [name] August 13, 1933 [id number]   CSW was able to meet with patient today to perform a routine visit.  CSW met with patient at his cardiology appointment with Dr. Lanelle Bal, Cardiologist, located at [address] [place], [place], [place] [address]. CSW is aware of how difficult it is for patient to try and attend his appointments alone, agreeing to meet with patient in the parking lot of the office building to assist with ambulation.  Patient struggles to ambulate while pulling his portable oxygen tank behind him.  Patient is already unsteady on his feet and at a greater risk for falls due to sudden onset of dizzy spells.  Patient often has a hard time remembering what he is told by his physicians during appointments, and has been encouraged to request that his son and/or daughter attend the appointments with him to provide a second set of ears.  Patient would also greatly benefit from the moral support provided to him by a family member, in the event that patient receives undesirable or devastating news.  Patient admitted to [place] [facility] that he was anxious about his appointment today, for fear that the "tumors in his lungs had grown or even spread".  CSW was able to offer counseling and supportive services while present.  CSW will plan to meet with patient for the next routine home visit on Wednesday, November 16th at 11:00am.  CSW will fax a correspondence letter to patient's Primary Care Physician, Dr. Seward Carol.  Nat Christen, BSW, MSW, LCSW  Licensed Education officer, environmental [facility]  Mailing [place] [address] [place], [place] [address] Physical Address-[address], [place], [place] [address] Toll Free Main # [phone] Fax # [phone] Cell # [phone]  Fax # [phone]  [email or web address]

## 2015-05-26 DIAGNOSIS — R0602 Shortness of breath: Secondary | ICD-10-CM

## 2015-05-26 NOTE — Congregational Nurse Program (Signed)
Congregational Nurse Program Note  Date of Encounter: 05/26/2015  Past Medical History: Past Medical History  Diagnosis Date  . Asthma   . COPD (chronic obstructive pulmonary disease) ([place])   . Emphysema of lung ([place])   . Hypertension   . On home O2     3L N/C continuous  . Multiple lung nodules   . Shortness of breath dyspnea   . Anxiety   . Depression   . Oxygen deficiency     Encounter Details:     CNP Questionnaire - 05/26/15 1710    Patient Demographics   Is this a new or existing patient? New   Patient is considered a/an Not Applicable   Patient Assistance   Patient's financial/insurance status Medicaid;Low Income   Patient referred to apply for the following financial assistance Not Applicable   Food insecurities addressed Provided food supplies   Transportation assistance Yes   Assistance securing medications No   Educational health offerings Chronic disease;Navigating the healthcare system   Encounter Details   Primary purpose of visit Safety;Chronic Illness/Condition Visit   Was an Emergency Department visit averted? Yes   Does patient have a medical provider? Yes   Patient referred to Follow up with established PCP   Was a mental health screening completed? (GAINS tool) No   Does patient have dental issues? No   Since previous encounter, have you referred patient for abnormal blood pressure that resulted in a new diagnosis or medication change? No   Since previous encounter, have you referred patient for abnormal blood glucose that resulted in a new diagnosis or medication change? No       Client was obtaining emergency assistance from [facility] when he became faint and SOB.  Client was SOB and diaphoratic.  Refuses EMS.  States he had driven himself to the center.  Informed client he could not drive self home due to safety.  Assisted client in contacting a friend to come and get him.  Client states his wife is at home.  Encouraged client to  seek immediate medical attention if symptoms do not improve.

## 2015-06-03 ENCOUNTER — Encounter: Payer: Self-pay | Admitting: *Deleted

## 2015-06-03 ENCOUNTER — Other Ambulatory Visit: Payer: Self-pay | Admitting: *Deleted

## 2015-06-03 NOTE — Patient Outreach (Signed)
[facility]) Care Management  06/03/2015  [name] 08/06/1933 [id number]   CSW was able to meet with patient today to perform the discharge home visit.  Patient admitted to [place] that he has come to the realization that he will not be able to move out of his home and live independently, always needing to be responsible for financially providing for his wife, [name].  Mrs. [name] is no longer in charge of managing their finances, as she was spending the money, but not paying any of the bills.  Patient reported, "I'm now having to borrow money from anyone and everyone to, not only pay our monthly expenses, but also try and dig [place] out of this tremendous whole that she has created".  Patient further reported, "I'm tired of having to live this way, especially when everybody continues to come to me expecting a handout".  Patient believes it would be taking on way too much to try and relocate, especially with their current financial situation and his health problems.  Patient fears that he "may not make it through the winter months with current medical conditions".   Patient went on to say that he had a "heart-to-heart conversation" with Dr.  Lanelle Bal, Cardiothoracic Surgeon with [facility] on October 26th, at which time Dr. Servando Snare was completely honest with patient about the severity of his Chronic Obstructive Pulmonary Disease.  Patient stated, "One bad cold or infection could due me in".  Patient admitted that this was upsetting news, but definitely information that he needed to receive to make sure to take extra precautions.  Patient stated, "I had my flu shot that same day".  Patient is scheduled to meet with his Primary Care Physician, Dr. Seward Carol on Friday, November 18th, at which time patient is hoping to have another "heart-to-heart".  Patient's daughter is planning to attend this appointment with patient to provide a "second set  of ears", as well as offer supportive services, if necessary.  CSW will perform a case closure on patient, as all goals of treatment have been met from social work standpoint and no additional social work needs have been identified at this time. CSW will notify patient's RNCM with [facility], [name] of CSW's plans to close patient's case. CSW will fax a correspondence letter to patient's Primary Care Physician, Dr. Seward Carol to ensure that Dr. Delfina Redwood is aware of CSW's involvement with patient. CSW will submit a case closure request to Lurline Del, Care Management Assistant with [facility], in the form of an [employer].    Nat Christen, BSW, MSW, LCSW  Licensed Education officer, [facility]  Mailing [place] [address], [place], [place] [address] Physical Address-[address], [place],  [address] Toll Free Main # [phone] Fax # [phone] Cell # [phone]  Fax # [phone]  [email or web address]

## 2015-06-04 ENCOUNTER — Encounter: Payer: Self-pay | Admitting: *Deleted

## 2015-06-05 DIAGNOSIS — E782 Mixed hyperlipidemia: Secondary | ICD-10-CM | POA: Diagnosis not present

## 2015-06-05 DIAGNOSIS — I10 Essential (primary) hypertension: Secondary | ICD-10-CM | POA: Diagnosis not present

## 2015-06-05 DIAGNOSIS — N183 Chronic kidney disease, stage 3 (moderate): Secondary | ICD-10-CM | POA: Diagnosis not present

## 2015-06-05 DIAGNOSIS — F329 Major depressive disorder, single episode, unspecified: Secondary | ICD-10-CM | POA: Diagnosis not present

## 2015-06-05 DIAGNOSIS — R040 Epistaxis: Secondary | ICD-10-CM | POA: Diagnosis not present

## 2015-06-05 DIAGNOSIS — J449 Chronic obstructive pulmonary disease, unspecified: Secondary | ICD-10-CM | POA: Diagnosis not present

## 2015-06-09 DIAGNOSIS — J449 Chronic obstructive pulmonary disease, unspecified: Secondary | ICD-10-CM | POA: Diagnosis not present

## 2015-06-13 DIAGNOSIS — J449 Chronic obstructive pulmonary disease, unspecified: Secondary | ICD-10-CM | POA: Diagnosis not present

## 2015-06-13 DIAGNOSIS — I10 Essential (primary) hypertension: Secondary | ICD-10-CM | POA: Diagnosis not present

## 2015-07-09 DIAGNOSIS — J449 Chronic obstructive pulmonary disease, unspecified: Secondary | ICD-10-CM | POA: Diagnosis not present

## 2015-07-13 DIAGNOSIS — J449 Chronic obstructive pulmonary disease, unspecified: Secondary | ICD-10-CM | POA: Diagnosis not present

## 2015-07-13 DIAGNOSIS — I10 Essential (primary) hypertension: Secondary | ICD-10-CM | POA: Diagnosis not present

## 2015-08-09 DIAGNOSIS — J449 Chronic obstructive pulmonary disease, unspecified: Secondary | ICD-10-CM | POA: Diagnosis not present

## 2015-08-13 DIAGNOSIS — J449 Chronic obstructive pulmonary disease, unspecified: Secondary | ICD-10-CM | POA: Diagnosis not present

## 2015-08-13 DIAGNOSIS — I10 Essential (primary) hypertension: Secondary | ICD-10-CM | POA: Diagnosis not present

## 2015-09-13 DIAGNOSIS — J449 Chronic obstructive pulmonary disease, unspecified: Secondary | ICD-10-CM | POA: Diagnosis not present

## 2015-09-13 DIAGNOSIS — I10 Essential (primary) hypertension: Secondary | ICD-10-CM | POA: Diagnosis not present

## 2015-10-11 DIAGNOSIS — J449 Chronic obstructive pulmonary disease, unspecified: Secondary | ICD-10-CM | POA: Diagnosis not present

## 2015-10-11 DIAGNOSIS — I10 Essential (primary) hypertension: Secondary | ICD-10-CM | POA: Diagnosis not present

## 2015-11-11 DIAGNOSIS — J449 Chronic obstructive pulmonary disease, unspecified: Secondary | ICD-10-CM | POA: Diagnosis not present

## 2015-11-11 DIAGNOSIS — I10 Essential (primary) hypertension: Secondary | ICD-10-CM | POA: Diagnosis not present

## 2015-12-11 DIAGNOSIS — J449 Chronic obstructive pulmonary disease, unspecified: Secondary | ICD-10-CM | POA: Diagnosis not present

## 2015-12-11 DIAGNOSIS — I10 Essential (primary) hypertension: Secondary | ICD-10-CM | POA: Diagnosis not present

## 2016-01-11 DIAGNOSIS — I10 Essential (primary) hypertension: Secondary | ICD-10-CM | POA: Diagnosis not present

## 2016-01-11 DIAGNOSIS — J449 Chronic obstructive pulmonary disease, unspecified: Secondary | ICD-10-CM | POA: Diagnosis not present

## 2016-02-08 IMAGING — DX DG CHEST 2V
2 series · 2 of 2 positions shown · non-contrast
Comparison: PA and lateral chest dated May 12, 2014

CLINICAL DATA: Weakness shortness of breath with history of oxygen
dependent COPD

EXAM:
CHEST  2 VIEW

[chest pa]
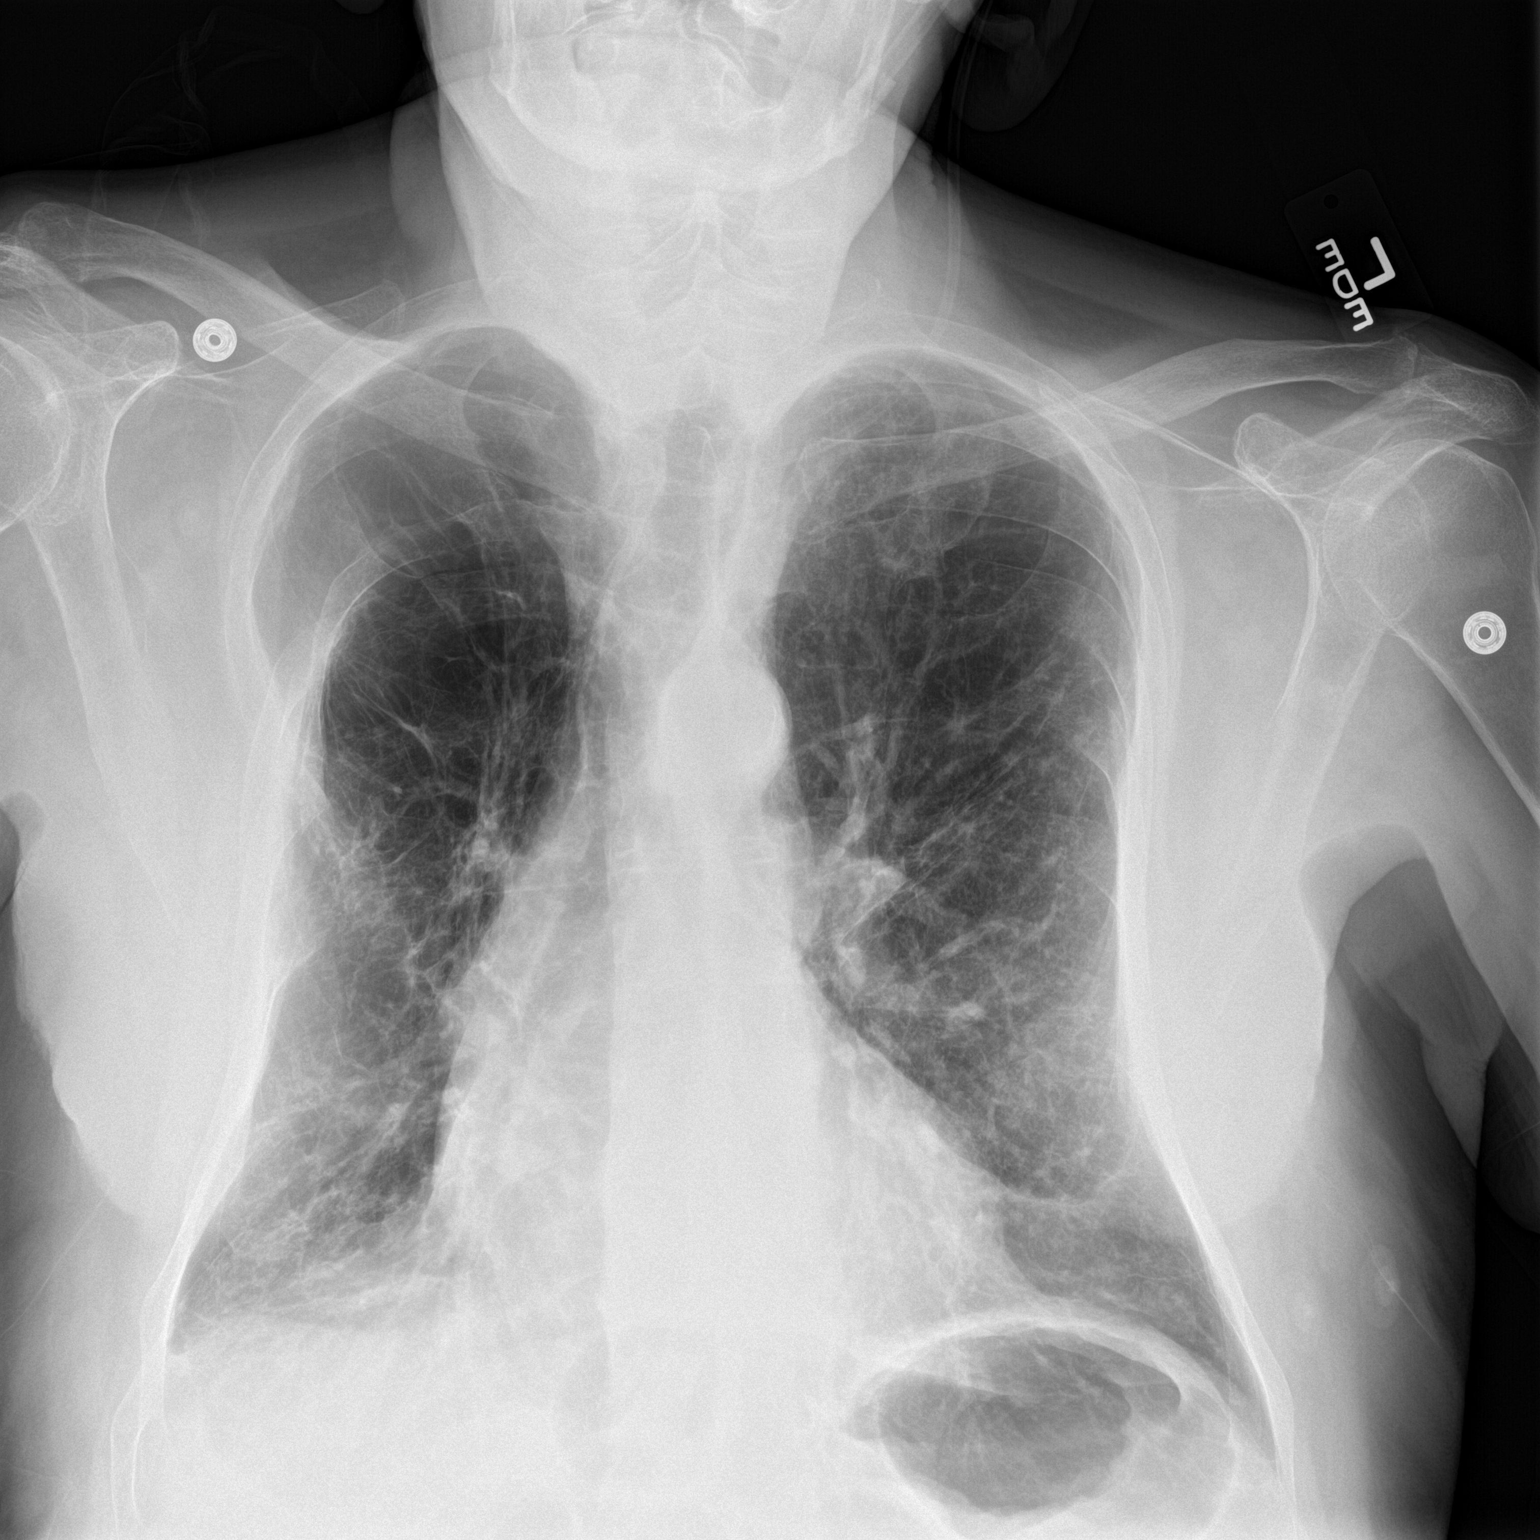

[chest lat]
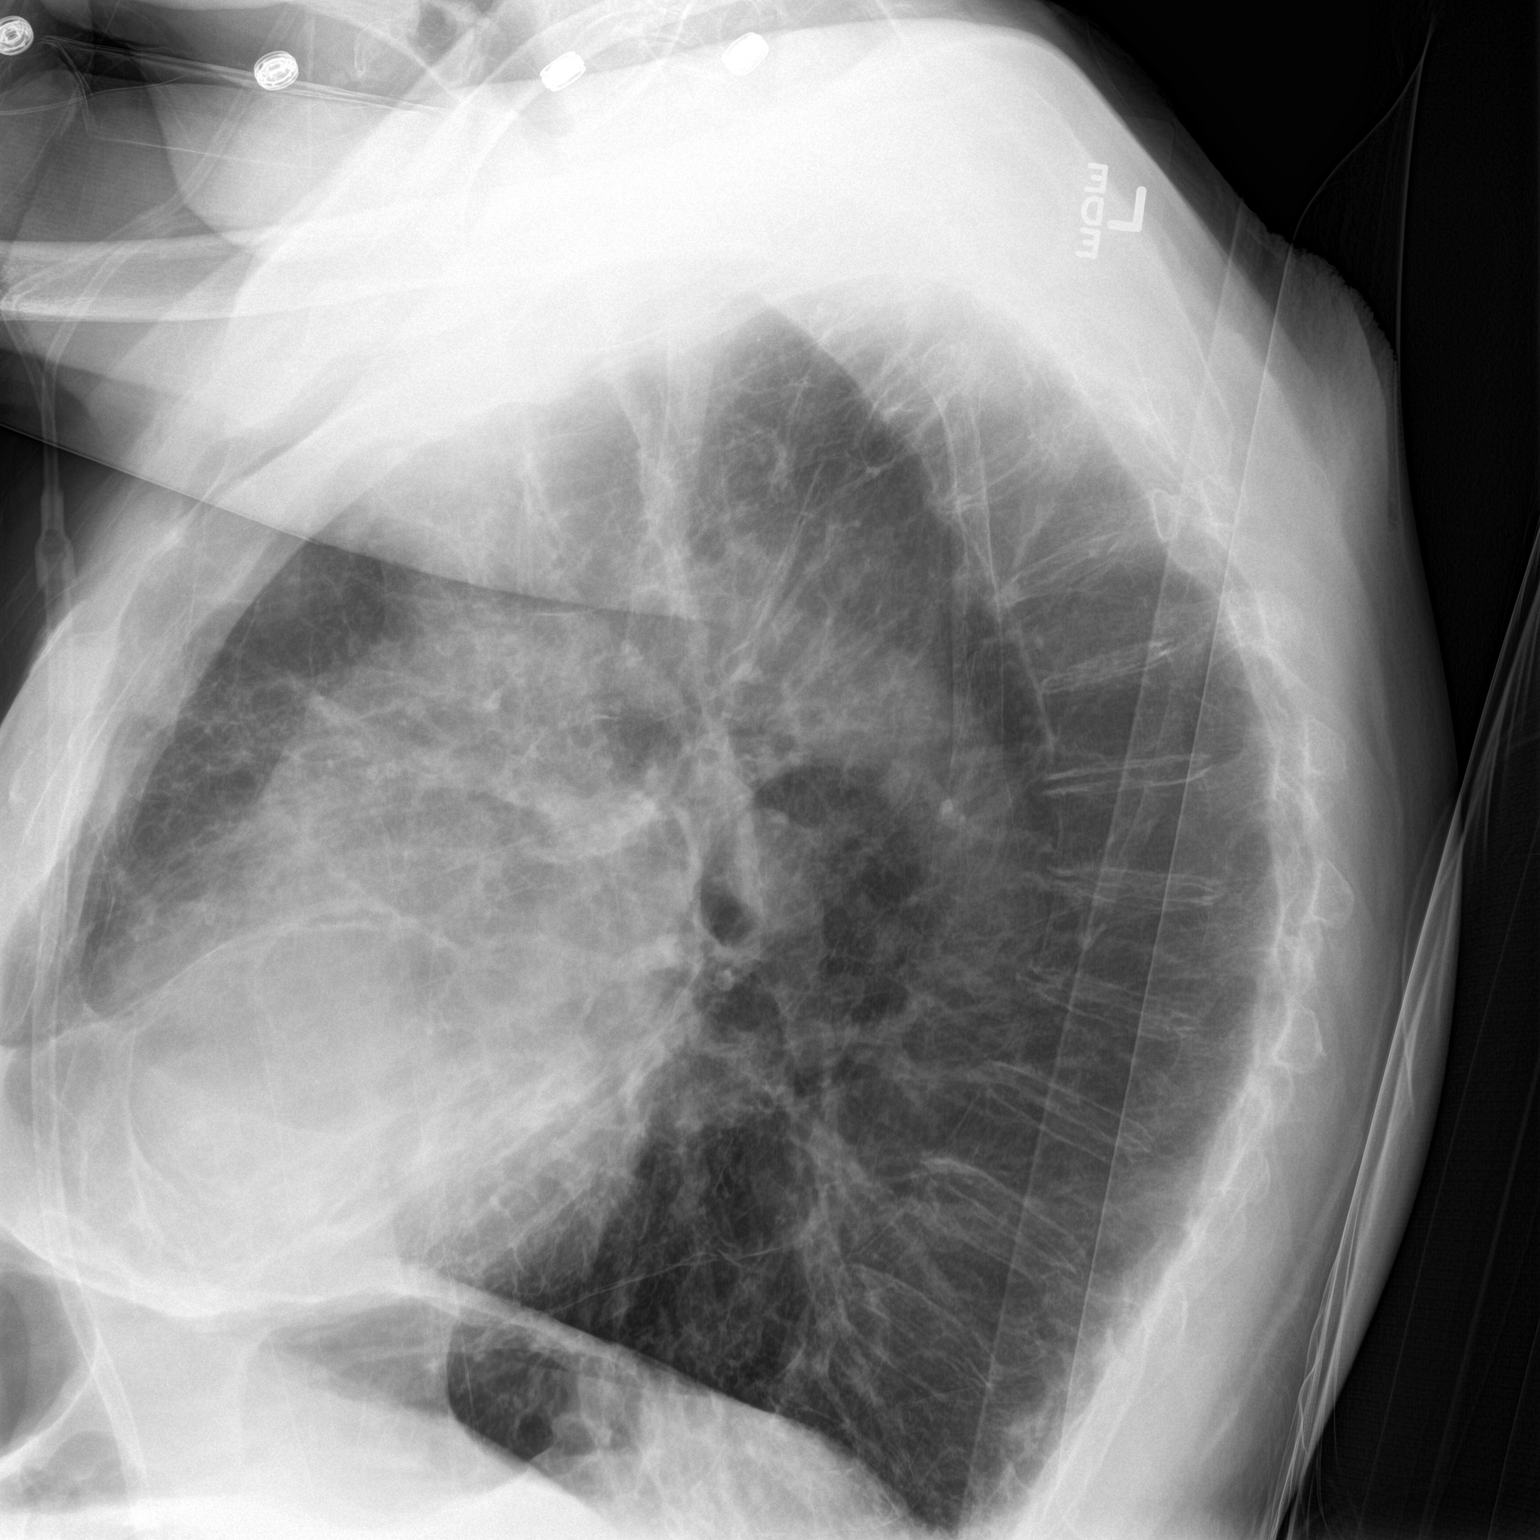

[2 of 2 positions shown; findings below may reference images not displayed]

FINDINGS: There is marked hyperinflation. The pulmonary interstitial markings
are chronically increased but are slightly more conspicuous today.
There is no alveolar infiltrate, pneumothorax, nor pleural effusion.
The heart and pulmonary vascularity are within the limits of normal.
There is chronic deformity of the right fifth rib.
IMPRESSION: Severe COPD. Slightly increased pulmonary interstitial markings may
reflect subsegmental atelectasis or interstitial pneumonia.

## 2016-02-10 DIAGNOSIS — J449 Chronic obstructive pulmonary disease, unspecified: Secondary | ICD-10-CM | POA: Diagnosis not present

## 2016-02-10 DIAGNOSIS — I10 Essential (primary) hypertension: Secondary | ICD-10-CM | POA: Diagnosis not present

## 2016-02-14 ENCOUNTER — Emergency Department (HOSPITAL_COMMUNITY): Payer: Commercial Managed Care - HMO

## 2016-02-14 ENCOUNTER — Inpatient Hospital Stay (HOSPITAL_COMMUNITY)
Admission: EM | Admit: 2016-02-14 | Discharge: 2016-02-20 | DRG: 190 | Disposition: A | Discharge status: Hospice | Payer: Commercial Managed Care - HMO | Attending: Internal Medicine | Admitting: Internal Medicine

## 2016-02-14 ENCOUNTER — Encounter (HOSPITAL_COMMUNITY): Payer: Self-pay | Admitting: Emergency Medicine

## 2016-02-14 DIAGNOSIS — J9620 Acute and chronic respiratory failure, unspecified whether with hypoxia or hypercapnia: Secondary | ICD-10-CM | POA: Diagnosis present

## 2016-02-14 DIAGNOSIS — R0600 Dyspnea, unspecified: Secondary | ICD-10-CM

## 2016-02-14 DIAGNOSIS — K5900 Constipation, unspecified: Secondary | ICD-10-CM | POA: Diagnosis not present

## 2016-02-14 DIAGNOSIS — N179 Acute kidney failure, unspecified: Secondary | ICD-10-CM | POA: Diagnosis not present

## 2016-02-14 DIAGNOSIS — G8929 Other chronic pain: Secondary | ICD-10-CM | POA: Diagnosis present

## 2016-02-14 DIAGNOSIS — J441 Chronic obstructive pulmonary disease with (acute) exacerbation: Secondary | ICD-10-CM | POA: Diagnosis not present

## 2016-02-14 DIAGNOSIS — Z7951 Long term (current) use of inhaled steroids: Secondary | ICD-10-CM

## 2016-02-14 DIAGNOSIS — I129 Hypertensive chronic kidney disease with stage 1 through stage 4 chronic kidney disease, or unspecified chronic kidney disease: Secondary | ICD-10-CM | POA: Diagnosis not present

## 2016-02-14 DIAGNOSIS — R0602 Shortness of breath: Secondary | ICD-10-CM

## 2016-02-14 DIAGNOSIS — A419 Sepsis, unspecified organism: Secondary | ICD-10-CM | POA: Diagnosis not present

## 2016-02-14 DIAGNOSIS — Z6825 Body mass index (BMI) 25.0-25.9, adult: Secondary | ICD-10-CM

## 2016-02-14 DIAGNOSIS — Z515 Encounter for palliative care: Secondary | ICD-10-CM | POA: Diagnosis present

## 2016-02-14 DIAGNOSIS — E46 Unspecified protein-calorie malnutrition: Secondary | ICD-10-CM | POA: Diagnosis present

## 2016-02-14 DIAGNOSIS — R5381 Other malaise: Secondary | ICD-10-CM | POA: Diagnosis not present

## 2016-02-14 DIAGNOSIS — Z66 Do not resuscitate: Secondary | ICD-10-CM | POA: Diagnosis present

## 2016-02-14 DIAGNOSIS — I130 Hypertensive heart and chronic kidney disease with heart failure and stage 1 through stage 4 chronic kidney disease, or unspecified chronic kidney disease: Secondary | ICD-10-CM | POA: Diagnosis present

## 2016-02-14 DIAGNOSIS — I5022 Chronic systolic (congestive) heart failure: Secondary | ICD-10-CM | POA: Diagnosis not present

## 2016-02-14 DIAGNOSIS — Z79891 Long term (current) use of opiate analgesic: Secondary | ICD-10-CM | POA: Diagnosis not present

## 2016-02-14 DIAGNOSIS — J449 Chronic obstructive pulmonary disease, unspecified: Secondary | ICD-10-CM | POA: Diagnosis not present

## 2016-02-14 DIAGNOSIS — R0603 Acute respiratory distress: Secondary | ICD-10-CM

## 2016-02-14 DIAGNOSIS — E872 Acidosis, unspecified: Secondary | ICD-10-CM

## 2016-02-14 DIAGNOSIS — N189 Chronic kidney disease, unspecified: Secondary | ICD-10-CM | POA: Diagnosis present

## 2016-02-14 DIAGNOSIS — R140 Abdominal distension (gaseous): Secondary | ICD-10-CM

## 2016-02-14 DIAGNOSIS — R1084 Generalized abdominal pain: Secondary | ICD-10-CM

## 2016-02-14 DIAGNOSIS — K5909 Other constipation: Secondary | ICD-10-CM | POA: Diagnosis present

## 2016-02-14 DIAGNOSIS — I4719 Other supraventricular tachycardia: Secondary | ICD-10-CM | POA: Diagnosis present

## 2016-02-14 DIAGNOSIS — R05 Cough: Secondary | ICD-10-CM | POA: Diagnosis not present

## 2016-02-14 DIAGNOSIS — R911 Solitary pulmonary nodule: Secondary | ICD-10-CM | POA: Diagnosis present

## 2016-02-14 DIAGNOSIS — Z9981 Dependence on supplemental oxygen: Secondary | ICD-10-CM

## 2016-02-14 DIAGNOSIS — F419 Anxiety disorder, unspecified: Secondary | ICD-10-CM | POA: Diagnosis present

## 2016-02-14 DIAGNOSIS — J9621 Acute and chronic respiratory failure with hypoxia: Secondary | ICD-10-CM | POA: Diagnosis present

## 2016-02-14 DIAGNOSIS — I471 Supraventricular tachycardia: Secondary | ICD-10-CM | POA: Diagnosis not present

## 2016-02-14 DIAGNOSIS — I429 Cardiomyopathy, unspecified: Secondary | ICD-10-CM | POA: Diagnosis present

## 2016-02-14 DIAGNOSIS — Z79899 Other long term (current) drug therapy: Secondary | ICD-10-CM | POA: Diagnosis not present

## 2016-02-14 DIAGNOSIS — K219 Gastro-esophageal reflux disease without esophagitis: Secondary | ICD-10-CM | POA: Diagnosis present

## 2016-02-14 DIAGNOSIS — R109 Unspecified abdominal pain: Secondary | ICD-10-CM | POA: Diagnosis not present

## 2016-02-14 DIAGNOSIS — Z87891 Personal history of nicotine dependence: Secondary | ICD-10-CM

## 2016-02-14 DIAGNOSIS — F329 Major depressive disorder, single episode, unspecified: Secondary | ICD-10-CM | POA: Diagnosis present

## 2016-02-14 DIAGNOSIS — J439 Emphysema, unspecified: Secondary | ICD-10-CM | POA: Diagnosis not present

## 2016-02-14 DIAGNOSIS — J432 Centrilobular emphysema: Secondary | ICD-10-CM | POA: Diagnosis not present

## 2016-02-14 DIAGNOSIS — I701 Atherosclerosis of renal artery: Secondary | ICD-10-CM | POA: Diagnosis not present

## 2016-02-14 LAB — I-STAT CHEM 8, ED
BUN: 29 mg/dL — ABNORMAL HIGH (ref 6–20)
CHLORIDE: 105 mmol/L (ref 101–111)
Calcium, Ion: 1.14 mmol/L (ref 1.12–1.23)
Creatinine, Ser: 1.2 mg/dL (ref 0.61–1.24)
GLUCOSE: 111 mg/dL — AB (ref 65–99)
HEMATOCRIT: 51 % (ref 39.0–52.0)
Hemoglobin: 17.3 g/dL — ABNORMAL HIGH (ref 13.0–17.0)
POTASSIUM: 3.9 mmol/L (ref 3.5–5.1)
SODIUM: 141 mmol/L (ref 135–145)
TCO2: 24 mmol/L (ref 0–100)

## 2016-02-14 LAB — BLOOD GAS, ARTERIAL
Acid-base deficit: 6.1 mmol/L — ABNORMAL HIGH (ref 0.0–2.0)
BICARBONATE: 19.4 meq/L — AB (ref 20.0–24.0)
O2 Content: 6 L/min
O2 Saturation: 95.8 %
PCO2 ART: 40.1 mmHg (ref 35.0–45.0)
PH ART: 7.305 — AB (ref 7.350–7.450)
PO2 ART: 86.7 mmHg (ref 80.0–100.0)
Patient temperature: 98.6
TCO2: 17.2 mmol/L (ref 0–100)

## 2016-02-14 LAB — BLOOD GAS, VENOUS
ACID-BASE EXCESS: 1.1 mmol/L (ref 0.0–2.0)
Bicarbonate: 25.6 mEq/L — ABNORMAL HIGH (ref 20.0–24.0)
O2 Saturation: 86.7 %
PCO2 VEN: 42 mmHg — AB (ref 45.0–50.0)
PH VEN: 7.401 — AB (ref 7.250–7.300)
PO2 VEN: 50.4 mmHg — AB (ref 31.0–45.0)
Patient temperature: 98.6
TCO2: 21.7 mmol/L (ref 0–100)

## 2016-02-14 LAB — LACTIC ACID, PLASMA
Lactic Acid, Venous: 4.3 mmol/L (ref 0.5–1.9)
Lactic Acid, Venous: 3.5 mmol/L (ref 0.5–1.9)

## 2016-02-14 LAB — I-STAT CG4 LACTIC ACID, ED
LACTIC ACID, VENOUS: 4.67 mmol/L — AB (ref 0.5–1.9)
Lactic Acid, Venous: 2.54 mmol/L (ref 0.5–1.9)

## 2016-02-14 LAB — CBC
HEMATOCRIT: 50.1 % (ref 39.0–52.0)
Hemoglobin: 16.3 g/dL (ref 13.0–17.0)
MCH: 30.8 pg (ref 26.0–34.0)
MCHC: 32.5 g/dL (ref 30.0–36.0)
MCV: 94.5 fL (ref 78.0–100.0)
PLATELETS: 152 10*3/uL (ref 150–400)
RBC: 5.3 MIL/uL (ref 4.22–5.81)
RDW: 14 % (ref 11.5–15.5)
WBC: 17.9 10*3/uL — AB (ref 4.0–10.5)

## 2016-02-14 LAB — COMPREHENSIVE METABOLIC PANEL
ALT: 18 U/L (ref 17–63)
AST: 26 U/L (ref 15–41)
Albumin: 4.2 g/dL (ref 3.5–5.0)
Alkaline Phosphatase: 81 U/L (ref 38–126)
Anion gap: 10 (ref 5–15)
BUN: 27 mg/dL — AB (ref 6–20)
CHLORIDE: 106 mmol/L (ref 101–111)
CO2: 25 mmol/L (ref 22–32)
Calcium: 9.3 mg/dL (ref 8.9–10.3)
Creatinine, Ser: 1.27 mg/dL — ABNORMAL HIGH (ref 0.61–1.24)
GFR calc Af Amer: 60 mL/min — ABNORMAL LOW (ref >60)
GFR calc non Af Amer: 52 mL/min — ABNORMAL LOW (ref >60)
GLUCOSE: 111 mg/dL — AB (ref 65–99)
POTASSIUM: 3.9 mmol/L (ref 3.5–5.1)
SODIUM: 141 mmol/L (ref 135–145)
Total Bilirubin: 1.4 mg/dL — ABNORMAL HIGH (ref 0.3–1.2)
Total Protein: 7 g/dL (ref 6.5–8.1)

## 2016-02-14 LAB — LACTATE DEHYDROGENASE: LDH: 138 U/L (ref 98–192)

## 2016-02-14 LAB — I-STAT TROPONIN, ED: Troponin i, poc: 0.01 ng/mL (ref 0.00–0.08)

## 2016-02-14 MED ORDER — ASPIRIN 300 MG RE SUPP: 300.0000 mg | RECTAL | Status: AC

## 2016-02-14 MED ORDER — GLYCERIN (LAXATIVE) 2.1 G RE SUPP
1.0000 | Freq: Once | RECTAL | Status: AC
  Administered 2016-02-14: 1 via RECTAL
  Filled 2016-02-14: qty 1

## 2016-02-14 MED ORDER — METHYLPREDNISOLONE SODIUM SUCC 125 MG IJ SOLR: 125.0000 mg | Freq: Once | INTRAMUSCULAR | Status: DC

## 2016-02-14 MED ORDER — IOPAMIDOL (ISOVUE-370) INJECTION 76%
100.0000 mL | Freq: Once | INTRAVENOUS | Status: AC | PRN
  Administered 2016-02-14: 100 mL via INTRAVENOUS

## 2016-02-14 MED ORDER — SODIUM CHLORIDE 0.9 % IV BOLUS (SEPSIS)
1000.0000 mL | Freq: Once | INTRAVENOUS | Status: AC
  Administered 2016-02-14: 1000 mL via INTRAVENOUS

## 2016-02-14 MED ORDER — MOMETASONE FURO-FORMOTEROL FUM 100-5 MCG/ACT IN AERO
2.0000 | INHALATION_SPRAY | Freq: Once | RESPIRATORY_TRACT | Status: AC
  Administered 2016-02-14: 2 via RESPIRATORY_TRACT
  Filled 2016-02-14: qty 8.8

## 2016-02-14 MED ORDER — ASPIRIN 81 MG PO CHEW
324.0000 mg | CHEWABLE_TABLET | ORAL | Status: AC
  Administered 2016-02-14: 324 mg via ORAL
  Filled 2016-02-14: qty 4

## 2016-02-14 MED ORDER — MOMETASONE FURO-FORMOTEROL FUM 200-5 MCG/ACT IN AERO
2.0000 | INHALATION_SPRAY | Freq: Two times a day (BID) | RESPIRATORY_TRACT | Status: DC
  Administered 2016-02-14 – 2016-02-16 (×4): 2 via RESPIRATORY_TRACT
  Filled 2016-02-14 (×2): qty 8.8

## 2016-02-14 MED ORDER — HEPARIN SODIUM (PORCINE) 5000 UNIT/ML IJ SOLN
5000.0000 [IU] | Freq: Three times a day (TID) | INTRAMUSCULAR | Status: DC
  Administered 2016-02-14 – 2016-02-17 (×9): 5000 [IU] via SUBCUTANEOUS
  Filled 2016-02-14 (×10): qty 1

## 2016-02-14 MED ORDER — HYDROMORPHONE HCL 1 MG/ML IJ SOLN
0.5000 mg | Freq: Four times a day (QID) | INTRAMUSCULAR | Status: DC | PRN
  Administered 2016-02-14 – 2016-02-17 (×10): 0.5 mg via INTRAVENOUS
  Filled 2016-02-14 (×10): qty 1

## 2016-02-14 MED ORDER — SODIUM CHLORIDE 0.9 % IV SOLN
Freq: Once | INTRAVENOUS | Status: AC
  Administered 2016-02-14: 100 mL/h via INTRAVENOUS

## 2016-02-14 MED ORDER — ALBUTEROL (5 MG/ML) CONTINUOUS INHALATION SOLN
10.0000 mg/h | INHALATION_SOLUTION | Freq: Once | RESPIRATORY_TRACT | Status: AC
  Administered 2016-02-14: 10 mg/h via RESPIRATORY_TRACT
  Filled 2016-02-14: qty 20

## 2016-02-14 MED ORDER — PIPERACILLIN-TAZOBACTAM 3.375 G IVPB 30 MIN
3.3750 g | INTRAVENOUS | Status: AC
  Administered 2016-02-14: 3.375 g via INTRAVENOUS
  Filled 2016-02-14: qty 50

## 2016-02-14 MED ORDER — ALBUTEROL SULFATE (2.5 MG/3ML) 0.083% IN NEBU
5.0000 mg | INHALATION_SOLUTION | Freq: Once | RESPIRATORY_TRACT | Status: DC
  Filled 2016-02-14: qty 6

## 2016-02-14 MED ORDER — SODIUM CHLORIDE 0.9 % IV SOLN
INTRAVENOUS | Status: DC
  Administered 2016-02-15: 500 mL via INTRAVENOUS
  Administered 2016-02-15 (×3): via INTRAVENOUS

## 2016-02-14 MED ORDER — PIPERACILLIN-TAZOBACTAM 3.375 G IVPB
3.3750 g | Freq: Three times a day (TID) | INTRAVENOUS | Status: DC
  Administered 2016-02-14 – 2016-02-18 (×11): 3.375 g via INTRAVENOUS
  Filled 2016-02-14 (×9): qty 50

## 2016-02-14 MED ORDER — IPRATROPIUM BROMIDE 0.02 % IN SOLN
1.0000 mg | Freq: Once | RESPIRATORY_TRACT | Status: AC
  Administered 2016-02-14: 1 mg via RESPIRATORY_TRACT
  Filled 2016-02-14: qty 5

## 2016-02-14 MED ORDER — ALPRAZOLAM 0.5 MG PO TABS
0.5000 mg | ORAL_TABLET | Freq: Every evening | ORAL | Status: DC | PRN
  Administered 2016-02-15 – 2016-02-16 (×3): 0.5 mg via ORAL
  Filled 2016-02-14 (×3): qty 1

## 2016-02-14 MED ORDER — SODIUM CHLORIDE 0.9 % IV SOLN: 250.0000 mL | INTRAVENOUS | Status: DC | PRN

## 2016-02-14 MED ORDER — SORBITOL 70 % SOLN
960.0000 mL | TOPICAL_OIL | ORAL | Status: DC | PRN
  Filled 2016-02-14: qty 240

## 2016-02-14 MED ORDER — SORBITOL 70 % SOLN: 960.0000 mL | TOPICAL_OIL | Freq: Once | ORAL | Status: DC

## 2016-02-14 MED ORDER — HYDROMORPHONE HCL 1 MG/ML IJ SOLN
1.0000 mg | Freq: Once | INTRAMUSCULAR | Status: AC
  Administered 2016-02-14: 1 mg via INTRAVENOUS
  Filled 2016-02-14: qty 1

## 2016-02-14 MED ORDER — IPRATROPIUM-ALBUTEROL 0.5-2.5 (3) MG/3ML IN SOLN
3.0000 mL | Freq: Four times a day (QID) | RESPIRATORY_TRACT | Status: DC | PRN
  Administered 2016-02-14 – 2016-02-16 (×3): 3 mL via RESPIRATORY_TRACT
  Filled 2016-02-14 (×3): qty 3

## 2016-02-14 MED ORDER — VANCOMYCIN HCL 10 G IV SOLR
1500.0000 mg | INTRAVENOUS | Status: AC
  Administered 2016-02-14: 1500 mg via INTRAVENOUS
  Filled 2016-02-14: qty 1500

## 2016-02-14 MED ORDER — VANCOMYCIN HCL IN DEXTROSE 750-5 MG/150ML-% IV SOLN
750.0000 mg | Freq: Two times a day (BID) | INTRAVENOUS | Status: DC
  Administered 2016-02-15: 750 mg via INTRAVENOUS
  Filled 2016-02-14: qty 150

## 2016-02-14 MED ORDER — SODIUM CHLORIDE 0.9 % IV BOLUS (SEPSIS)
500.0000 mL | Freq: Once | INTRAVENOUS | Status: AC
  Administered 2016-02-14: 500 mL via INTRAVENOUS

## 2016-02-14 NOTE — ED Notes (Signed)
Lab delay - Pt needs second set of cultures -  Refusing to let me stick L arm.  R arm has 2 IV's and just got a CT with contrast. RN notified that I cannot go above the IV.  Told RN I will have to come back to get second set.

## 2016-02-14 NOTE — ED Notes (Signed)
Dr. Ellender Hose, MD, requests RN to hold PRN enema at this time.

## 2016-02-14 NOTE — ED Provider Notes (Signed)
[facility] DEPT Provider Note   CSN: [id number] Arrival date & time: 02/14/16  1308  First Provider Contact:  None       History   Chief Complaint Chief Complaint  Patient presents with  . Constipation  . Shortness of Breath    HPI [name] is a 82 y.o. male.  HPI 82 year old male with past medical history of COPD on 3 L baseline, chronic constipation, who presents with abdominal distention and pain due to constipation. The patient states over the last 3-4 days, he has been unable to pass any stool. Over the last 24 hours, he has had progressively worsening abdominal distention and pain. He tried an enema, stool softener, and attempted to self disimpact. He was not successful. Since then, he has had progressively worsening shortness of breath and abdominal distention. He feels that all of his symptoms would resolve if he had a bowel movement. He does endorse shortness of breath and wheezing, but states this is usual for him when he becomes this constipated.  Past Medical History:  Diagnosis Date  . Anxiety   . Asthma   . COPD (chronic obstructive pulmonary disease) ([place])   . Depression   . Emphysema of lung ([place])   . Hypertension   . Multiple lung nodules   . On home O2    3L N/C continuous  . Oxygen deficiency   . Shortness of breath dyspnea     Patient Active Problem List   Diagnosis Date Noted  . Chronic respiratory failure with hypoxia ([place]) 06/13/2014  . Protein-calorie malnutrition ([place]) 06/13/2014  . Multifocal atrial tachycardia ([place]) 06/13/2014  . Systolic CHF with reduced left ventricular function, NYHA class 2 ([place]) 06/13/2014  . Bradycardia   . COPD with acute exacerbation ([place]) 06/09/2014  . Atrial tachycardia, multifocal ([place]) 06/09/2014  . COPD exacerbation ([place]) 09/12/2013  . Acute on chronic respiratory failure ([place]) 09/12/2013  . COPD GOLD IV/ 02 dep 06/06/2013  . O2 dependent 06/06/2013  . Renal stone 06/06/2013  . Abnormal chest  x-ray with multiple lung nodules 06/06/2013  . Cholelithiasis 09/06/2012    Past Surgical History:  Procedure Laterality Date  . HERNIA REPAIR  2008  . lump removed from neck  2004       Home Medications    Prior to Admission medications   Medication Sig Start Date End Date Taking? Authorizing Provider  Albuterol Sulfate (PROAIR RESPICLICK) [id number] (90 BASE) MCG/ACT AEPB Inhale 2 puffs into the lungs every 6 (six) hours as needed. 09/17/14   Tanda Rockers, MD  ALPRAZolam Duanne Moron) 0.5 MG tablet Take 1 tablet (0.5 mg total) by mouth at bedtime as needed for anxiety. 09/13/13   Seward Carol, MD  budesonide-formoterol [facility]) 160-4.5 MCG/ACT inhaler Take 2 puffs first thing in am and then another 2 puffs about 12 hours later. Patient taking differently: Inhale 2 puffs into the lungs 2 (two) times daily. Take 2 puffs first thing in am and then another 2 puffs about 12 hours later. 07/29/14   Tanda Rockers, MD  citalopram (CELEXA) 20 MG tablet Take 20 mg by mouth daily.    Historical Provider, MD  guaiFENesin (MUCINEX) 600 MG 12 hr tablet Take 1 tablet (600 mg total) by mouth 2 (two) times daily. 06/13/14   Nishant Dhungel, MD  ipratropium-albuterol (DUONEB) 0.5-2.5 (3) MG/3ML SOLN Take 3 mLs by nebulization every 6 (six) hours as needed (increased / severe wheezing , perisstent shortness of breath). 06/13/14   Louellen Molder, MD  losartan (COZAAR) 50 MG tablet Take 1 tablet (50 mg total) by mouth daily. 06/13/14   Nishant Dhungel, MD  ondansetron (ZOFRAN) 4 MG tablet Take 1 tablet (4 mg total) by mouth every 6 (six) hours. Patient not taking: Reported on 05/13/2015 09/14/14   Dorie Rank, MD  oxyCODONE-acetaminophen (PERCOCET/ROXICET) 5-325 MG per tablet Take 1-2 tablets by mouth every 6 (six) hours as needed. 09/14/14   Dorie Rank, MD    Family History History reviewed. No pertinent family history.  Social History Social History  Substance Use Topics  . Smoking status: Former Smoker     Packs/day: 1.50    Years: 50.00    Types: Cigarettes    Quit date: 09/06/2002  . Smokeless tobacco: Never Used  . Alcohol use No     Allergies   Review of patient's allergies indicates no known allergies.   Review of Systems Review of Systems  Constitutional: Positive for fatigue. Negative for chills and fever.  HENT: Negative for congestion and rhinorrhea.   Eyes: Negative for visual disturbance.  Respiratory: Positive for shortness of breath and wheezing. Negative for cough.   Cardiovascular: Negative for chest pain and leg swelling.  Gastrointestinal: Positive for abdominal distention, abdominal pain and constipation. Negative for diarrhea, nausea and vomiting.  Genitourinary: Negative for dysuria and flank pain.  Musculoskeletal: Negative for neck pain.  Skin: Negative for rash.  Allergic/Immunologic: Negative for immunocompromised state.  Neurological: Negative for syncope and headaches.  Hematological: Does not bruise/bleed easily.     Physical Exam Updated Vital Signs BP 136/93 (BP Location: Left Arm)   Pulse (!) 136   Temp 98.2 F (36.8 C) (Oral)   Resp (!) 28   SpO2 91%   Physical Exam  Constitutional: He appears well-developed. He has a sickly appearance. He appears ill. He appears distressed.  HENT:  Head: Normocephalic and atraumatic.  Mouth/Throat: Oropharynx is clear and moist. No oropharyngeal exudate.  Eyes: Conjunctivae are normal. Pupils are equal, round, and reactive to light.  Neck: Neck supple.  Cardiovascular: Regular rhythm and normal heart sounds.  Tachycardia present.  Exam reveals no friction rub.   No murmur heard. Pulmonary/Chest: Accessory muscle usage present. Tachypnea noted. He has decreased breath sounds. He has wheezes.  Abdominal: Soft. Normal appearance and bowel sounds are normal. He exhibits distension. There is generalized tenderness. There is no rigidity, no rebound and no guarding.  Musculoskeletal: He exhibits no edema.    Neurological: He is alert. He exhibits normal muscle tone.  Skin: Skin is warm. Capillary refill takes less than 2 seconds. No rash noted.  Nursing note and vitals reviewed.    ED Treatments / Results  Labs (all labs ordered are listed, but only abnormal results are displayed) Labs Reviewed  CBC - Abnormal; Notable for the following:       Result Value   WBC 17.9 (*)    All other components within normal limits  COMPREHENSIVE METABOLIC PANEL - Abnormal; Notable for the following:    Glucose, Bld 111 (*)    BUN 27 (*)    Creatinine, Ser 1.27 (*)    Total Bilirubin 1.4 (*)    GFR calc non Af Amer 52 (*)    GFR calc Af Amer 60 (*)    All other components within normal limits  BLOOD GAS, VENOUS - Abnormal; Notable for the following:    pH, Ven 7.401 (*)    pCO2, Ven 42.0 (*)    pO2, Ven 50.4 (*)  Bicarbonate 25.6 (*)    All other components within normal limits  BLOOD GAS, ARTERIAL - Abnormal; Notable for the following:    pH, Arterial 7.305 (*)    Bicarbonate 19.4 (*)    Acid-base deficit 6.1 (*)    All other components within normal limits  LACTIC ACID, PLASMA - Abnormal; Notable for the following:    Lactic Acid, Venous 4.3 (*)    All other components within normal limits  LACTIC ACID, PLASMA - Abnormal; Notable for the following:    Lactic Acid, Venous 3.5 (*)    All other components within normal limits  I-STAT CG4 LACTIC ACID, ED - Abnormal; Notable for the following:    Lactic Acid, Venous 2.54 (*)    All other components within normal limits  I-STAT CHEM 8, ED - Abnormal; Notable for the following:    BUN 29 (*)    Glucose, Bld 111 (*)    Hemoglobin 17.3 (*)    All other components within normal limits  I-STAT CG4 LACTIC ACID, ED - Abnormal; Notable for the following:    Lactic Acid, Venous 4.67 (*)    All other components within normal limits  CULTURE, BLOOD (ROUTINE X 2)  CULTURE, BLOOD (ROUTINE X 2)  LACTATE DEHYDROGENASE  LACTIC ACID, PLASMA  LACTIC  ACID, PLASMA  CBC  BASIC METABOLIC PANEL  MAGNESIUM  PHOSPHORUS  TROPONIN I  TROPONIN I  TROPONIN I  LACTIC ACID, PLASMA  LACTIC ACID, PLASMA  CBC  BASIC METABOLIC PANEL  I-STAT VENOUS BLOOD GAS, ED  I-STAT TROPOININ, ED  I-STAT TROPOININ, ED    EKG  EKG Interpretation  Date/Time:  Sunday February 14 2016 13:32:03 EDT Ventricular Rate:  133 PR Interval:    QRS Duration: 106 QT Interval:  289 QTC Calculation: 430 R Axis:   -11 Text Interpretation:  Sinus tachycardia Repolarization abnormality, prob rate related When compared to last tracing ST depressions diffusely, likely demand No ST elevations Confirmed by Ellender Hose MD, Lysbeth Galas [phone]) on 02/14/2016 10:09:55 PM       Radiology Dg Chest Port 1 View  Result Date: 02/14/2016 CLINICAL DATA:  Shortness of breath today. EXAM: PORTABLE CHEST 1 VIEW COMPARISON:  Most recent chest imaging chest CT 05/13/2015. Most recent radiograph 05/09/2014 FINDINGS: Advanced emphysema again seen. Heart size and mediastinal contours are unchanged from prior radiograph allowing for differences in technique. There is atherosclerosis of the aortic arch. Chronic bronchial thickening with mild increase from prior with associated increase in interstitial markings diffusely. Streaky lower lobe opacities appear similar and likely scarring. Chronic blunting of the right costophrenic angle. No pneumothorax. Chronic change about the right ribs. IMPRESSION: Advanced emphysema with chronic bronchial thickening, however increased from prior exam. This may reflect acute on chronic bronchitis. Electronically Signed   By: Jeb Levering M.D.   On: 02/14/2016 18:17  Ct Angio Abd/pel W/ And/or W/o  Result Date: 02/14/2016 CLINICAL DATA:  82 year old with severe shortness of breath and diffuse abdominal pain. Lack of bowel movements. EXAM: CT ANGIOGRAPHY ABDOMEN AND PELVIS TECHNIQUE: Multidetector CT imaging of the abdomen and pelvis was performed using the standard protocol  during bolus administration of intravenous contrast. Multiplanar reconstructed images including MIPs were obtained and reviewed to evaluate the vascular anatomy. CONTRAST:  100 mL Isovue 370 COMPARISON:  09/14/2015 FINDINGS: ARTERIAL FINDINGS: Aorta: Atherosclerotic disease in the abdominal aorta without aneurysm or dissection. Celiac axis: Common trunk of the SMA and celiac trunk. Mild plaque in the common trunk without significant stenosis. The common  hepatic artery and splenic artery are patent. Superior mesenteric: Superior mesenteric artery main branches are patent. Left renal: Single left renal artery is widely patent with mild atherosclerotic disease. Right renal: There are 2 right renal arteries. The main right renal artery has focal noncalcified plaque 1 cm from the origin causing at least 75% narrowing of the lumen. The inferior accessory right renal artery appears to be patent. Inferior mesenteric: Inferior mesenteric artery is patent. Left iliac: Atherosclerotic plaque and ectasia in the left common iliac artery, measuring up to 1.9 cm. Ectasia of the proximal left internal iliac artery measuring up to 1.7 cm. Left external iliac artery is patent. Proximal left femoral arteries are patent. Right iliac: Irregular atherosclerotic plaque in the proximal right common iliac artery. There is a focal aneurysm of the distal right common iliac artery measuring up to 2.6 cm and previously measured 2.4 cm. Diffuse atherosclerotic disease in the right internal iliac artery. Right external iliac artery is patent. Proximal right femoral arteries are patent. There may be a small amount of plaque in the proximal right SFA distally, partially visualized. Venous findings: The left common iliac vein is compressed by the right common iliac artery which is a normal anatomic configuration and no evidence for a DVT. IVC and renal veins are patent. Limited evaluation of the portal venous system due to motion artifact. Review of  the MIP images confirms the above findings. NONVASCULAR FINDINGS: Lung bases demonstrate diffuse emphysematous changes. No pleural effusions. Multiple gallstones without evidence for gallbladder inflammation. Normal appearance of the liver. No biliary dilatation. Normal appearance of the pancreas without inflammation or duct dilatation. Normal appearance of spleen without enlargement. Normal appearance of the adrenal glands. Again noted is a large stone in left renal pelvis measuring up to 1.3 cm without significant hydronephrosis. Evidence for cortical and parapelvic left renal cysts. Again noted is a 0.7 cm stone in the right kidney lower pole. Again noted is a exophytic right renal cyst. No significant right hydronephrosis. Normal appearance of the urinary bladder. Rectal distension due to stool. There is mild perirectal and presacral edema. Normal appearance of stomach and small bowel. No evidence for bowel obstruction. No suspicious lymphadenopathy in the abdomen or pelvis. No significant free fluid. No free air. No acute bone abnormality. No gross abnormality to the prostate or seminal vesicles. IMPRESSION: Rectal distension due to a large amount of stool with mild perirectal and presacral edema. Findings are suggestive for constipation. Atherosclerotic disease involving the aorta, iliac arteries and visceral arteries. No significant stenosis in the mesenteric arteries with a common trunk for the celiac trunk and SMA. Stenosis in the main right renal artery due to focal noncalcified plaque. 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 Bilateral nephrolithiasis without hydronephrosis. Cholelithiasis. Bilateral renal cysts. Electronically Signed   By: Markus Daft M.D.   On: 02/14/2016 15:30   Procedures .Critical Care Performed by: Duffy Bruce Authorized by: Duffy Bruce   Critical care provider statement:    Critical care time (minutes):  65   Critical care time was  exclusive of:  Separately billable procedures and treating other patients   Critical care was necessary to treat or prevent imminent or life-threatening deterioration of the following conditions:  Cardiac failure, circulatory failure, respiratory failure, sepsis and shock   Critical care was time spent personally by me on the following activities:  Ordering and performing treatments and interventions, development of treatment plan with patient or surrogate, ordering and review of  laboratory studies, discussions with consultants, ordering and review of radiographic studies, pulse oximetry, evaluation of patient's response to treatment, re-evaluation of patient's condition, examination of patient, review of old charts and obtaining history from patient or surrogate   I assumed direction of critical care for this patient from another provider in my specialty: no     (including critical care time)  Medications Ordered in ED Medications  albuterol (PROVENTIL) (2.5 MG/3ML) 0.083% nebulizer solution 5 mg (5 mg Nebulization Not Given 02/14/16 1357)  piperacillin-tazobactam (ZOSYN) IVPB 3.375 g (not administered)  vancomycin (VANCOCIN) IVPB 750 mg/150 ml premix (not administered)  sorbitol, milk of mag, mineral oil, glycerin (SMOG) enema (not administered)  aspirin chewable tablet 324 mg (not administered)    Or  aspirin suppository 300 mg (not administered)  0.9 %  sodium chloride infusion (not administered)  heparin injection 5,000 Units (not administered)  0.9 %  sodium chloride infusion (not administered)  HYDROmorphone (DILAUDID) injection 0.5 mg (not administered)  albuterol (PROVENTIL,VENTOLIN) solution continuous neb (10 mg/hr Nebulization Given 02/14/16 1357)  ipratropium (ATROVENT) nebulizer solution 1 mg (1 mg Nebulization Given 02/14/16 1357)  sodium chloride 0.9 % bolus 1,000 mL (0 mLs Intravenous Stopped 02/14/16 1743)  iopamidol (ISOVUE-370) 76 % injection 100 mL (100 mLs Intravenous  Contrast Given 02/14/16 1457)  piperacillin-tazobactam (ZOSYN) IVPB 3.375 g (0 g Intravenous Stopped 02/14/16 1743)  vancomycin (VANCOCIN) 1,500 mg in sodium chloride 0.9 % 500 mL IVPB (0 mg Intravenous Stopped 02/14/16 1951)  sodium chloride 0.9 % bolus 1,000 mL (0 mLs Intravenous Stopped 02/14/16 1743)  HYDROmorphone (DILAUDID) injection 1 mg (1 mg Intravenous Given 02/14/16 1607)  sodium chloride 0.9 % bolus 500 mL (0 mLs Intravenous Stopped 02/14/16 1952)  Glycerin (Adult) 2.1 g suppository 1 suppository (1 suppository Rectal Given 02/14/16 1853)  0.9 %  sodium chloride infusion (100 mL/hr Intravenous New Bag/Given 02/14/16 1951)  mometasone-formoterol (DULERA) 100-5 MCG/ACT inhaler 2 puff (2 puffs Inhalation Given 02/14/16 2142)     Initial Impression / Assessment and Plan / ED Course  I have reviewed the triage vital signs and the nursing notes.  Pertinent labs & imaging results that were available during my care of the patient were reviewed by me and considered in my medical decision making (see chart for details).  Clinical Course  82 year old male with past medical history as above who presents with increasing abdominal distention and pain in the setting of constipation. On arrival, patient is tachycardic, tachypnea, and ill appearing. Stat labs obtained shows baseline kidney function and patient was taken immediately to the CT scanner. He has a moderate lactic acidosis on initial labs and I'm concerned for possible mesenteric ischemia. Must also consider stercoral colitis, diverticulitis, or other intra-abdominal pathology. Will also consider pneumonia, although given his abdominal pain and correlation with constipation, pulmonary etiology is less likely. I suspect his shortness of breath is secondary to abdominal distention and possibly metabolic acidosis in the setting of intra-abdominal pathology. Otherwise, EKG shows diffuse ST depressions, which is likely secondary to demand. No ST  elevations. Given his ill appearance, tachycardia, and tachypnea, will start empiric sepsis protocol with vancomycin, Zosyn, and 30 mL/kg fluid.  Labs, imaging reviewed as above. CBC shows leukocytosis of 18,000. Lactic acid, trending up from 2.5-4.7. CMP shows baseline renal function. CT angiogram of the abdomen and pelvis shows no acute ischemia. He has a large stool burden with surrounding perirectal stranding consistent with constipation. Given his abdominal pain and increasing lactate, concern for possible stercoral colitis as  well. I'm hesitant to be aggressive with enemas given his history of long-term steroid use, dstention, and hesitancy to cause any rectal/colonic injury. Gentle, manual disimpaction performed at bedside within distal anus with good success. Glycerin suppository placed. Will attempt to have bowel movement after suppository. Otherwise, critical care was consult it for management of worsening sepsis and lactic acidosis. Patient does appear moderately improved after disimpaction. Plan to continue ABX, admit to ICU. Surgery aware but will hold on formal consult at this time, as pt is poor surgical candidate, will attempt non-operative management. IVF, ABX given.  Final Clinical Impressions(s) / ED Diagnoses   Final diagnoses:  Abdominal distension  Abdominal pain, diffuse  Lactic acidosis  Sepsis, due to unspecified organism ([place])  CKD (chronic kidney disease), unspecified stage  Chronic obstructive pulmonary disease, unspecified COPD type ([facility])  Respiratory distress  Metabolic acidosis  Constipation, unspecified constipation type    New Prescriptions New Prescriptions   No medications on file     Duffy Bruce, MD 02/14/16 2215

## 2016-02-14 NOTE — ED Triage Notes (Signed)
Per EMS pt c/o constipation x 3 days, exacerbation of chronic SOB. Pt reports hx of chronic emphysema, COPD. EMS administered Duoneb en route. Pt wears 2 L/min O2 via nasal cannula at home, wife increased to 5 L/min today due to SOB.  Pt also reports near syncope after standing from chair today, staggered into wall and obtained skin tears to arm. No LOC, no fall to ground.

## 2016-02-14 NOTE — Progress Notes (Signed)
Pharmacy Antibiotic Note  [name] is a 82 y.o. male with PMHx asthma, COPD on home 02, multiple lung nodules, anxiety and depression admitted on 02/14/2016 with SOB, fall.  Pharmacy has been consulted for Vancomycin and Zosyn dosing for sepsis.   Plan: Vancomycin 1500mg  IV x1 then 750mg  IV q12h Zosyn 3.375g IV x1 over 30 minutes, then Zosyn 3.375g IV q8h (infuse over 4 hours) F/u renal function, VT at Css, cultures, clinical course   Temp (24hrs), Avg:98.2 F (36.8 C), Min:98.2 F (36.8 C), Max:98.2 F (36.8 C)   Recent Labs Lab 02/14/16 1404 02/14/16 1408  WBC 17.9*  --   CREATININE  --  1.20  LATICACIDVEN  --  2.54*    CrCl cannot be calculated (Unknown ideal weight.).    No Known Allergies  Antimicrobials this admission: 7/30 Vancomycin >>  7/30 Zosyn >>   Dose adjustments this admission:  Microbiology results: 7/30 BCx: ordered  Thank you for allowing pharmacy to be a part of this patient's care.  Ralene Bathe, PharmD, BCPS 02/14/2016, 2:47 PM  Pager: [phone]

## 2016-02-14 NOTE — ED Notes (Signed)
Patient transported to CT will get second set of cultures when he returns

## 2016-02-14 NOTE — ED Notes (Signed)
EDP and RN made aware of patient lactic result.

## 2016-02-14 NOTE — H&P (Addendum)
PULMONARY / CRITICAL CARE MEDICINE   Name: [name] MRN: [id number] DOB: August 30, 1933    ADMISSION DATE:  02/14/2016 CONSULTATION DATE:  02/14/16  REFERRING MD:  ED  CHIEF COMPLAINT:  Constipation, dyspnea.  HISTORY OF PRESENT ILLNESS:   Mr. [name] is an 82 year old with very severe COPD, on home O2, 3L, lung nodule, mild systolic heart failure, multifocal atrial tachycardia. He is a patient of Dr. Melvyn Mckinney. Has been followed for pulmonary nodule that have remained stable on follow-up CT imaging.  He has history of chronic constipation. He complains of severe constipation for the past 3 days. Over the past 24 hours he's had progressive abdominal pain, distention, dyspnea. He also reports near syncope while standing up from chair. He did not lose any consciousness or fall to the ground but has some skin tears over the arm. In the ED got a CT scan of the abdomen which showed large amount of rectal stool with mild perirectal edema. Initial lactic acid was 2.54 which increased to 4.67 and then improved slightly to 4.3. He has received 30cc/kg IVF bolus. Gentle manual disimpaction performed at bedside and glycerin suppository placed. Surgery is aware of the patient. PCCM called for further evaluation.  PAST MEDICAL HISTORY :  He  has a past medical history of Anxiety; Asthma; COPD (chronic obstructive pulmonary disease) ([place]); Depression; Emphysema of lung ([place]); Hypertension; Multiple lung nodules; On home O2; Oxygen deficiency; and Shortness of breath dyspnea.  PAST SURGICAL HISTORY: He  has a past surgical history that includes lump removed from neck (2004) and Hernia repair (2008).  No Known Allergies  No current facility-administered medications on file prior to encounter.    Current Outpatient Prescriptions on File Prior to Encounter  Medication Sig  . ALPRAZolam (XANAX) 0.5 MG tablet Take 1 tablet (0.5 mg total) by mouth at bedtime as needed for anxiety.  . citalopram (CELEXA) 20 MG  tablet Take 20 mg by mouth daily.    FAMILY HISTORY:  His indicated that his mother is deceased. He indicated that his father is deceased.    SOCIAL HISTORY: He  reports that he quit smoking about 13 years ago. His smoking use included Cigarettes. He has a 75.00 pack-year smoking history. He has never used smokeless tobacco. He reports that he does not drink alcohol or use drugs.  REVIEW OF SYSTEMS:    SUBJECTIVE:   VITAL SIGNS: BP 166/67   Pulse (!) 128   Temp 98.2 F (36.8 C) (Oral)   Resp (!) 28   Ht 6\' 1"  (1.854 m)   Wt 182 lb (82.6 kg)   SpO2 91%   BMI 24.01 kg/m   HEMODYNAMICS:   VENTILATOR SETTINGS:   INTAKE / OUTPUT: No intake/output data recorded.  PHYSICAL EXAMINATION: General:  Mild distress Neuro:  Moves all four extremities. No focal deficits HEENT:  Dry mucus membranes, No thryromegaly, JVD Cardiovascular:  RRR, No MRG Lungs:  Clear, No wheeze or crackles Abdomen:  Distended, soft, Mild tenderness over suprapubic area Musculoskeletal: Normal tone and bulk Skin:  Bruises, skin tear over the arms  LABS:  BMET  Recent Labs Lab 02/14/16 1404 02/14/16 1408  NA 141 141  K 3.9 3.9  CL 106 105  CO2 25  --   BUN 27* 29*  CREATININE 1.27* 1.20  GLUCOSE 111* 111*    Electrolytes  Recent Labs Lab 02/14/16 1404  CALCIUM 9.3    CBC  Recent Labs Lab 02/14/16 1404 02/14/16 1408  WBC 17.9*  --  HGB 16.3 17.3*  HCT 50.1 51.0  PLT 152  --     Coag's No results for input(s): APTT, INR in the last 168 hours.  Sepsis Markers  Recent Labs Lab 02/14/16 1408 02/14/16 1735 02/14/16 1831  LATICACIDVEN 2.54* 4.67* 4.3*    ABG  Recent Labs Lab 02/14/16 1818  PHART 7.305*  PCO2ART 40.1  PO2ART 86.7    Liver Enzymes  Recent Labs Lab 02/14/16 1404  AST 26  ALT 18  ALKPHOS 81  BILITOT 1.4*  ALBUMIN 4.2    Cardiac Enzymes No results for input(s): TROPONINI, PROBNP in the last 168 hours.  Glucose No results for  input(s): GLUCAP in the last 168 hours.  Imaging CT abd, pelvis 7/30> Rectal distention due to large amount of stool with mild perirectal and presacral edema. CXR 7/30> COPD, bronchits  STUDIES:    CULTURES: Bcx 7/30 X 2 >  ANTIBIOTICS: Vancomycin 7/30 > Zosyn 7/30 >  SIGNIFICANT EVENTS:  LINES/TUBES:  DISCUSSION: 82 year old with h/o severe COPD Admitted with abdominal distention, constipation for the past 3 days. This is apparently recurrent problem with him. He has elevated lactic acid concerning for intra-abdominal sepsis versus ischemia versus dehydration.  Surgery is aware of the patient and recommended manual disimpaction first which has been performed in the ED. We will admit him to the ICU for close monitoring, continue IV fluid resuscitation. He'll get started on broad-spectrum antibiotics.  ASSESSMENT / PLAN:  PULMONARY A: Severe COPD on home O2. FEV1 23% in 2015 Increased dyspnea likely secondary from abdominal distention. P:   Continue supplemental O2 Continue outpatient Symbicort Duonebs necessary.  CARDIOVASCULAR A:  Sinus tachycardia EKG shows non specific ST changes with normal troponin Mild systolic cardiomyopathy. EF 45-50% in 2015 Elevated LA P:  Continue IVF NS at 125cc/hr Trend LA and troponins  RENAL A:   Stable P:   Monitor urine output and Cr  GASTROINTESTINAL A:   Severe constipation s/p manual disimpaction Concern for bowel ischemia P:   Continue to monitor abdominal exam Dilaudid prn for abd pain. Will try to use sparingly to avoid worsening of constipation We will avoid aggressive bowel regimen due to concern for bowel ischemia. Gentle tap water enema. He will need a surgery evaluation tonight if his lactic acid or clinical examination worsens.  HEMATOLOGIC A:   Leukocytosis Elevated Hb P:  [place]ikley hemo concentrated Repeat CBC in AM  INFECTIOUS A:   Concern for intra abdominal source of sepsis P:   Continue broad  spectrum antibiotics Follow cultures  ENDOCRINE A:   Stable P:    NEUROLOGIC A:   Stable P:    FAMILY  - Updates: Pt updated at bedside. Code status is DNR (confirmed with the patient) - Inter-disciplinary family meet or Palliative Care meeting due by:  8/6  Critical care time- 17 mins  Dalton Garfinkel MD [facility] Pager [phone] If no answer or after 3pm call: [phone] 02/14/2016, 9:07 PM

## 2016-02-14 NOTE — ED Notes (Signed)
2 sets blood cultures obtained prior to antibiotic administer.

## 2016-02-15 LAB — BASIC METABOLIC PANEL
ANION GAP: 7 (ref 5–15)
BUN: 32 mg/dL — ABNORMAL HIGH (ref 6–20)
CALCIUM: 8.2 mg/dL — AB (ref 8.9–10.3)
CO2: 20 mmol/L — AB (ref 22–32)
Chloride: 114 mmol/L — ABNORMAL HIGH (ref 101–111)
Creatinine, Ser: 1.51 mg/dL — ABNORMAL HIGH (ref 0.61–1.24)
GFR, EST AFRICAN AMERICAN: 49 mL/min — AB (ref >60)
GFR, EST NON AFRICAN AMERICAN: 42 mL/min — AB (ref >60)
Glucose, Bld: 130 mg/dL — ABNORMAL HIGH (ref 65–99)
Potassium: 4.2 mmol/L (ref 3.5–5.1)
Sodium: 141 mmol/L (ref 135–145)

## 2016-02-15 LAB — CBC
HEMATOCRIT: 45.6 % (ref 39.0–52.0)
Hemoglobin: 14.6 g/dL (ref 13.0–17.0)
MCH: 30.7 pg (ref 26.0–34.0)
MCHC: 32 g/dL (ref 30.0–36.0)
MCV: 95.8 fL (ref 78.0–100.0)
PLATELETS: 145 10*3/uL — AB (ref 150–400)
RBC: 4.76 MIL/uL (ref 4.22–5.81)
RDW: 14.4 % (ref 11.5–15.5)
WBC: 21.6 10*3/uL — AB (ref 4.0–10.5)

## 2016-02-15 LAB — PHOSPHORUS: PHOSPHORUS: 2.3 mg/dL — AB (ref 2.5–4.6)

## 2016-02-15 LAB — TROPONIN I
TROPONIN I: 0.1 ng/mL — AB (ref <0.03)
Troponin I: 0.11 ng/mL (ref <0.03)
Troponin I: 0.14 ng/mL (ref <0.03)

## 2016-02-15 LAB — LACTIC ACID, PLASMA
LACTIC ACID, VENOUS: 2.8 mmol/L — AB (ref 0.5–1.9)
Lactic Acid, Venous: 3.9 mmol/L (ref 0.5–1.9)
Lactic Acid, Venous: 1.8 mmol/L (ref 0.5–1.9)

## 2016-02-15 LAB — MRSA PCR SCREENING: MRSA by PCR: NEGATIVE

## 2016-02-15 LAB — MAGNESIUM: MAGNESIUM: 1.9 mg/dL (ref 1.7–2.4)

## 2016-02-15 MED ORDER — GLYCERIN (LAXATIVE) 2.1 G RE SUPP
1.0000 | Freq: Once | RECTAL | Status: AC
  Administered 2016-02-15: 1 via RECTAL
  Filled 2016-02-15: qty 1

## 2016-02-15 MED ORDER — ONDANSETRON HCL 4 MG/2ML IJ SOLN
4.0000 mg | Freq: Four times a day (QID) | INTRAMUSCULAR | Status: DC | PRN
  Administered 2016-02-15 – 2016-02-17 (×8): 4 mg via INTRAVENOUS
  Filled 2016-02-15 (×8): qty 2

## 2016-02-15 MED ORDER — DOCUSATE SODIUM 100 MG PO CAPS
100.0000 mg | ORAL_CAPSULE | Freq: Every day | ORAL | Status: DC
  Administered 2016-02-15 – 2016-02-16 (×2): 100 mg via ORAL
  Filled 2016-02-15 (×2): qty 1

## 2016-02-15 MED ORDER — ACETAMINOPHEN 325 MG PO TABS: 650.0000 mg | ORAL_TABLET | Freq: Four times a day (QID) | ORAL | Status: DC | PRN

## 2016-02-15 MED ORDER — SODIUM PHOSPHATES 45 MMOLE/15ML IV SOLN
20.0000 mmol | Freq: Once | INTRAVENOUS | Status: AC
  Administered 2016-02-15: 20 mmol via INTRAVENOUS
  Filled 2016-02-15: qty 6.67

## 2016-02-15 MED ORDER — METOPROLOL TARTRATE 5 MG/5ML IV SOLN: 5.0000 mg | INTRAVENOUS | Status: DC | PRN

## 2016-02-15 MED ORDER — CITALOPRAM HYDROBROMIDE 20 MG PO TABS
20.0000 mg | ORAL_TABLET | Freq: Every day | ORAL | Status: DC
  Administered 2016-02-15 – 2016-02-20 (×6): 20 mg via ORAL
  Filled 2016-02-15 (×6): qty 1

## 2016-02-15 MED ORDER — GI COCKTAIL ~~LOC~~
30.0000 mL | Freq: Two times a day (BID) | ORAL | Status: DC | PRN
  Administered 2016-02-15: 30 mL via ORAL
  Filled 2016-02-15: qty 30

## 2016-02-15 NOTE — Progress Notes (Signed)
MD Notified on lactic acid of 3.9 orders received 500 bolus over 1 hour started

## 2016-02-15 NOTE — Progress Notes (Signed)
[facility] Progress Note Patient Name: [name] DOB: [birth year] MRN: [id number]   Date of Service  02/15/2016  HPI/Events of Note  Is and Os were recalculated as ED intake is not reflected acurately in epic.  Per RN, so far, pt has gotten 3.4 L and has made 200 ml. Concentrated urine.   Remains comfortable.  160/70, 109, 25, 92% on3 L  eICU Interventions  Currently on 125 mls/hr. Lactate to be drawn soon. Will observe next 1-2 hrs re: resp status and uo.      Intervention Category Evaluation Type: Other  [place] 02/15/2016, 3:54 AM

## 2016-02-15 NOTE — Progress Notes (Signed)
eLink Physician-Brief Progress Note Patient Name: [name] DOB: 05-21-1934 MRN: [id number]   Date of Service  02/15/2016  HPI/Events of Note  RN called earlier as pt was agitated and HTN in 170s.   Pt received his nightly dose of xanax.   Pt seen, comfortable, not in distress.   112/50, 110, 24, 93% [name]  eICU Interventions  Will observe for now. Cont present management        [place] 02/15/2016, 1:17 AM

## 2016-02-15 NOTE — Progress Notes (Signed)
PULMONARY / CRITICAL CARE MEDICINE   Name: [name] MRN: [id number] DOB: 12-17-1933    ADMISSION DATE:  02/14/2016 CONSULTATION DATE:  02/14/16  REFERRING MD:  ED  CHIEF COMPLAINT:  Constipation, dyspnea.  HISTORY OF PRESENT ILLNESS:   [name] is an 82 year old with very severe COPD, on home O2, 3L, lung nodule, mild systolic heart failure, multifocal atrial tachycardia. He is a patient of Dr. Melvyn Novas. Has been followed for pulmonary nodule that have remained stable on follow-up CT imaging.  He has history of chronic constipation. He complains of severe constipation for the past 3 days. Over the past 24 hours he's had progressive abdominal pain, distention, dyspnea. He also reports near syncope while standing up from chair. He did not lose any consciousness or fall to the ground but has some skin tears over the arm. In the ED got a CT scan of the abdomen which showed large amount of rectal stool with mild perirectal edema. Initial lactic acid was 2.54 which increased to 4.67 and then improved slightly to 4.3. He has received 30cc/kg IVF bolus. Gentle manual disimpaction performed at bedside and glycerin suppository placed. Surgery is aware of the patient. PCCM called for further evaluation.    SUBJECTIVE: RN reports large BM.  Afebrile, VSS.  Lactic acid cleared.  RN reports no acute events.  Pt reports feeling better this am after BM  VITAL SIGNS: BP 122/81   Pulse (!) 108   Temp 98.6 F (37 C) (Oral)   Resp (!) 24   Ht 6' (1.829 m)   Wt 191 lb 2.2 oz (86.7 kg)   SpO2 93%   BMI 25.92 kg/m   HEMODYNAMICS:   VENTILATOR SETTINGS:   INTAKE / OUTPUT: I/O last 3 completed shifts: In: 1925 [I.V.:1875; IV Piggyback:50] Out: 200 [Urine:200]  PHYSICAL EXAMINATION: General:  Elderly male in NAD Neuro:  Moves all four extremities. No focal deficits, AAOx4 HEENT:  Dry mucus membranes, No thryromegaly, JVD Cardiovascular:  RRR, No MRG Lungs:  Diminished but clear, No wheeze or  crackles Abdomen: soft, no tenderness to palpation Musculoskeletal: Normal tone and bulk Skin:  Bruises, skin tear over the arms  LABS:  BMET  Recent Labs Lab 02/14/16 1404 02/14/16 1408 02/15/16 0410  NA 141 141 141  K 3.9 3.9 4.2  CL 106 105 114*  CO2 25  --  20*  BUN 27* 29* 32*  CREATININE 1.27* 1.20 1.51*  GLUCOSE 111* 111* 130*    Electrolytes  Recent Labs Lab 02/14/16 1404 02/15/16 0410  CALCIUM 9.3 8.2*  MG  --  1.9  PHOS  --  2.3*    CBC  Recent Labs Lab 02/14/16 1404 02/14/16 1408 02/15/16 0410  WBC 17.9*  --  21.6*  HGB 16.3 17.3* 14.6  HCT 50.1 51.0 45.6  PLT 152  --  145*    Coag's No results for input(s): APTT, INR in the last 168 hours.  Sepsis Markers  Recent Labs Lab 02/15/16 0041 02/15/16 0410 02/15/16 0701  LATICACIDVEN 3.9* 2.8* 1.8    ABG  Recent Labs Lab 02/14/16 1818  PHART 7.305*  PCO2ART 40.1  PO2ART 86.7    Liver Enzymes  Recent Labs Lab 02/14/16 1404  AST 26  ALT 18  ALKPHOS 81  BILITOT 1.4*  ALBUMIN 4.2    Cardiac Enzymes  Recent Labs Lab 02/15/16 0410 02/15/16 0701  TROPONINI 0.10* 0.11*    Glucose No results for input(s): GLUCAP in the last 168 hours.  STUDIES:  CT abd, pelvis 7/30 >> Rectal distention due to large amount of stool with mild perirectal and presacral edema. CXR 7/30 >> COPD, bronchits  CULTURES: Bcx 7/30 X 2 >>  ANTIBIOTICS: Vancomycin 7/30 >> 7/31 Zosyn 7/30 >>  SIGNIFICANT EVENTS:  LINES/TUBES:  DISCUSSION: 82 year old with h/o severe COPD.  Admitted with abdominal distention, worsening constipation for 3 days.  Initial concerns for sepsis with elevated lactic acid vs ischemia vs dehydration.  Surgery consulted, patient manually disimpacted + suppository, volume resuscitation.  Lactic acid cleared, mild AKI.  BM 7/31 with relief.    ASSESSMENT / PLAN:  PULMONARY A: Severe COPD on home O2. FEV1 23% in 2015 Increased dyspnea likely secondary from abdominal  distention. P:   Continue supplemental O2 Continue outpatient Symbicort Duonebs PRN  CARDIOVASCULAR A:  Sinus tachycardia EKG shows non specific ST changes with normal troponin Mild systolic cardiomyopathy. EF 45-50% in 2015 Elevated LA P:  Reduce IVF NS to 75cc/hr Monitor LA and troponins  RENAL A:   Mild AKI P:   Monitor urine output and Cr  GASTROINTESTINAL A:   Severe constipation s/p manual disimpaction Concern for bowel ischemia - abd pain relieved with BM.  No further evidence of ischemia.  P:   Continue to monitor abdominal exam Dilaudid prn for abd pain. Will try to use sparingly to avoid worsening of constipation SMOG enema PRN  Add colace PO Repeat glycerin suppository  HEMATOLOGIC A:   Leukocytosis Elevated Hb - likely hemoconcentrated P:  Trend CBC  INFECTIOUS A:   Concern for intra abdominal source of sepsis P:   Continue abx as above  D/C vancomycin  Follow cultures  NEUROLOGIC A:   Anxiety  P:  Monitor / supportive care  PRN xanxax  FAMILY: - Updates: Pt updated at bedside 7/31. Code status is DNR (confirmed with the patient) - Inter-disciplinary family meet or Palliative Care meeting due by:  8/6   Transfer to medical floor.  Anticipate discharge in next 24-48 hours.   Noe Gens, NP-C [facility] Pgr: [phone] or if no answer [phone] 02/15/2016, 9:08 AM  He had BM this AM and feels better.  Still has bloating, and having reflux.  Feels short of breath.  Alert, no wheeze, HR regular/tachy, abd soft >> mild distention with increased tympany  Lactic acid 1.8, WBC 21.6.  Assessment/plan: Severe constipation with rectal distention >> likely from chronic opiate use. - bowel regimen  - limit narcotics - clear liquid diet  Acid reflux. - GI cocktail  Hx of COPD with chronic respiratory failure/hypoxia. - continue BDs, supplemental oxygen  ?sepsis >> elevated WBC and lactic acid could be acute stress  response rather than infection. - check procalcitonin >> if negative, then consider d/c Abx soon  Chronic systolic CHF. - monitor fluid balance  AKI >> improved.  DNR/DNI.  Chesley Mires, MD [facility]/Critical [facility] 02/15/2016, 10:51 AM Pager:  [phone] After 3pm call: [phone]  - continue IV fluids

## 2016-02-15 NOTE — Progress Notes (Signed)
eLink Physician-Brief Progress Note Patient Name: [name] DOB: 21-Apr-1934 MRN: [id number]   Date of Service  02/15/2016  HPI/Events of Note  Pt with nausea  eICU Interventions  zofran prn     Intervention Category Evaluation Type: Other  [place] 02/15/2016, 6:37 AM

## 2016-02-15 NOTE — Progress Notes (Signed)
[facility] Progress Note Patient Name: [name] DOB: [birth year] MRN: [id number]   Date of Service  02/15/2016  HPI/Events of Note  RN calls as lactate now 3.9 from 3.5 from 4.3.  Has gotten 1.4 L and barely any urine. 120/80, 114. Tachypneic in 26, 91% on 3-4L.   eICU Interventions  Will bolus with NS 500 ml over 1 hr and observe resp status.  Differential is spesis vs ischemia vs dehydration.  Pt with severe copd and has CHF EF 40-45%. Will cont with hydration.         [place] 02/15/2016, 2:33 AM

## 2016-02-16 ENCOUNTER — Inpatient Hospital Stay (HOSPITAL_COMMUNITY): Payer: Commercial Managed Care - HMO

## 2016-02-16 DIAGNOSIS — K5909 Other constipation: Secondary | ICD-10-CM

## 2016-02-16 DIAGNOSIS — J439 Emphysema, unspecified: Secondary | ICD-10-CM

## 2016-02-16 DIAGNOSIS — R5381 Other malaise: Secondary | ICD-10-CM

## 2016-02-16 LAB — CBC WITH DIFFERENTIAL/PLATELET
BASOS ABS: 0 10*3/uL (ref 0.0–0.1)
BASOS PCT: 0 %
EOS PCT: 0 %
Eosinophils Absolute: 0 10*3/uL (ref 0.0–0.7)
HCT: 48.1 % (ref 39.0–52.0)
Hemoglobin: 15.2 g/dL (ref 13.0–17.0)
LYMPHS PCT: 5 %
Lymphs Abs: 1 10*3/uL (ref 0.7–4.0)
MCH: 30.6 pg (ref 26.0–34.0)
MCHC: 31.6 g/dL (ref 30.0–36.0)
MCV: 96.8 fL (ref 78.0–100.0)
MONO ABS: 2.1 10*3/uL — AB (ref 0.1–1.0)
Monocytes Relative: 10 %
Neutro Abs: 18.6 10*3/uL — ABNORMAL HIGH (ref 1.7–7.7)
Neutrophils Relative %: 85 %
PLATELETS: 170 10*3/uL (ref 150–400)
RBC: 4.97 MIL/uL (ref 4.22–5.81)
RDW: 14.7 % (ref 11.5–15.5)
WBC: 21.8 10*3/uL — AB (ref 4.0–10.5)

## 2016-02-16 LAB — BASIC METABOLIC PANEL
ANION GAP: 8 (ref 5–15)
ANION GAP: 9 (ref 5–15)
BUN: 41 mg/dL — ABNORMAL HIGH (ref 6–20)
BUN: 40 mg/dL — ABNORMAL HIGH (ref 6–20)
CALCIUM: 8.6 mg/dL — AB (ref 8.9–10.3)
CALCIUM: 8.5 mg/dL — AB (ref 8.9–10.3)
CHLORIDE: 112 mmol/L — AB (ref 101–111)
CO2: 22 mmol/L (ref 22–32)
CO2: 21 mmol/L — ABNORMAL LOW (ref 22–32)
Chloride: 112 mmol/L — ABNORMAL HIGH (ref 101–111)
Creatinine, Ser: 1.43 mg/dL — ABNORMAL HIGH (ref 0.61–1.24)
Creatinine, Ser: 1.33 mg/dL — ABNORMAL HIGH (ref 0.61–1.24)
GFR calc non Af Amer: 45 mL/min — ABNORMAL LOW (ref >60)
GFR, EST AFRICAN AMERICAN: 52 mL/min — AB (ref >60)
GFR, EST AFRICAN AMERICAN: 57 mL/min — AB (ref >60)
GFR, EST NON AFRICAN AMERICAN: 49 mL/min — AB (ref >60)
GLUCOSE: 77 mg/dL (ref 65–99)
Glucose, Bld: 88 mg/dL (ref 65–99)
POTASSIUM: 4.3 mmol/L (ref 3.5–5.1)
Potassium: 4.6 mmol/L (ref 3.5–5.1)
SODIUM: 142 mmol/L (ref 135–145)
SODIUM: 142 mmol/L (ref 135–145)

## 2016-02-16 LAB — CBC
HEMATOCRIT: 47.8 % (ref 39.0–52.0)
Hemoglobin: 15 g/dL (ref 13.0–17.0)
MCH: 30.3 pg (ref 26.0–34.0)
MCHC: 31.4 g/dL (ref 30.0–36.0)
MCV: 96.6 fL (ref 78.0–100.0)
PLATELETS: 151 10*3/uL (ref 150–400)
RBC: 4.95 MIL/uL (ref 4.22–5.81)
RDW: 14.6 % (ref 11.5–15.5)
WBC: 21 10*3/uL — AB (ref 4.0–10.5)

## 2016-02-16 LAB — PHOSPHORUS: PHOSPHORUS: 3.2 mg/dL (ref 2.5–4.6)

## 2016-02-16 LAB — MAGNESIUM: MAGNESIUM: 2.3 mg/dL (ref 1.7–2.4)

## 2016-02-16 LAB — PROCALCITONIN: Procalcitonin: 0.76 ng/mL

## 2016-02-16 MED ORDER — ARFORMOTEROL TARTRATE 15 MCG/2ML IN NEBU
15.0000 ug | INHALATION_SOLUTION | Freq: Two times a day (BID) | RESPIRATORY_TRACT | Status: DC
  Administered 2016-02-16 – 2016-02-17 (×2): 15 ug via RESPIRATORY_TRACT
  Filled 2016-02-16 (×3): qty 2

## 2016-02-16 MED ORDER — IPRATROPIUM-ALBUTEROL 0.5-2.5 (3) MG/3ML IN SOLN
3.0000 mL | Freq: Three times a day (TID) | RESPIRATORY_TRACT | Status: DC
  Filled 2016-02-16: qty 3

## 2016-02-16 MED ORDER — SENNOSIDES-DOCUSATE SODIUM 8.6-50 MG PO TABS
2.0000 | ORAL_TABLET | Freq: Every day | ORAL | Status: DC
  Administered 2016-02-16 – 2016-02-20 (×5): 2 via ORAL
  Filled 2016-02-16 (×5): qty 2

## 2016-02-16 MED ORDER — GUAIFENESIN ER 600 MG PO TB12
1200.0000 mg | ORAL_TABLET | Freq: Two times a day (BID) | ORAL | Status: DC
  Administered 2016-02-16 – 2016-02-20 (×9): 1200 mg via ORAL
  Filled 2016-02-16 (×9): qty 2

## 2016-02-16 MED ORDER — IPRATROPIUM-ALBUTEROL 0.5-2.5 (3) MG/3ML IN SOLN: 3.0000 mL | RESPIRATORY_TRACT | Status: DC | PRN

## 2016-02-16 MED ORDER — TRAMADOL HCL 50 MG PO TABS
50.0000 mg | ORAL_TABLET | Freq: Four times a day (QID) | ORAL | Status: DC | PRN
  Administered 2016-02-16 – 2016-02-17 (×2): 50 mg via ORAL
  Filled 2016-02-16 (×2): qty 1

## 2016-02-16 MED ORDER — BENZONATATE 100 MG PO CAPS
200.0000 mg | ORAL_CAPSULE | Freq: Three times a day (TID) | ORAL | Status: DC | PRN
  Administered 2016-02-16 – 2016-02-19 (×3): 200 mg via ORAL
  Filled 2016-02-16 (×3): qty 2

## 2016-02-16 MED ORDER — BUDESONIDE 0.5 MG/2ML IN SUSP
0.5000 mg | Freq: Two times a day (BID) | RESPIRATORY_TRACT | Status: DC
  Administered 2016-02-16 – 2016-02-20 (×8): 0.5 mg via RESPIRATORY_TRACT
  Filled 2016-02-16 (×9): qty 2

## 2016-02-16 NOTE — Progress Notes (Signed)
PULMONARY / CRITICAL CARE MEDICINE   Name: JUDE SKIPTON MRN: [id number] DOB: Apr 10, 1934    ADMISSION DATE:  02/14/2016 CONSULTATION DATE:  02/14/16  REFERRING MD:  ED  CHIEF COMPLAINT:  Constipation, dyspnea.  BRIEF SUMMARY:  Mr. Romanowski is an 82 year old with PMH of chronic constipation, very severe COPD, on home O2, 3L, lung nodule, mild systolic heart failure, multifocal atrial tachycardia. He is a patient of Dr. Melvyn Novas. Has been followed for pulmonary nodule that have remained stable on follow-up CT imaging.  Admitted 7/30 with a 3 day history of worsening constipation.  24 hours prior to admit, he developed progressive abdominal pain, distention and SOB. He also reported near syncope while standing up from chair. He did not lose any consciousness or fall to the ground but has some skin tears over the arm.   Initial ER evaluation included a CT of the abdomen which showed large amount of rectal stool with mild perirectal edema. Initial lactic acid was 2.54 which increased to 4.67 and then improved slightly to 4.3. He was treated with aggressive volume resuscitation (30cc/kg IVF bolus). Gentle manual disimpaction performed at bedside and glycerin suppository placed. Surgery was contacted regarding the patient.  He was admitted per PCCM for further evaluation.     SUBJECTIVE:  Pt reports feeling weak / tired.  Reports no further BM since 7/31.  Not eating / drinking as it causes nausea.  Afebrile, cleared lactic acid, PCT 0.76, improving Sr Cr, WBC remains elevated at 21.  Blood cultures pending but negative to date.  Tachycardic, Tachypneic and mildly hypertensive.    VITAL SIGNS: BP (!) 145/86 (BP Location: Right Arm)   Pulse (!) 110   Temp 98.8 F (37.1 C) (Oral)   Resp (!) 26   Ht 6' (1.829 m)   Wt 201 lb 1 oz (91.2 kg)   SpO2 100%   BMI 27.27 kg/m   HEMODYNAMICS:   VENTILATOR SETTINGS:   INTAKE / OUTPUT: I/O last 3 completed shifts: In: 4142.9 [P.O.:240; I.V.:3652.9; IV  Piggyback:250] Out: [id number] [Urine:1185; Stool:2]  PHYSICAL EXAMINATION: General:  Elderly male in NAD, appears uncomfortable on bedpan Neuro:  Moves all four extremities. No focal deficits, AAOx4 HEENT:  Dry mucus membranes, No thryromegaly, JVD Cardiovascular:  RRR, No MRG Lungs:  Diminished but clear, No wheeze or crackles Abdomen: soft, mild distention, no tenderness to palpation Musculoskeletal: Normal tone and bulk Skin:  Bruises, skin tear over the arms  LABS:  BMET  Recent Labs Lab 02/14/16 1404 02/14/16 1408 02/15/16 0410 02/16/16 0513  NA 141 141 141 142  K 3.9 3.9 4.2 4.6  CL 106 105 114* 112*  CO2 25  --  20* 22  BUN 27* 29* 32* 41*  CREATININE 1.27* 1.20 1.51* 1.43*  GLUCOSE 111* 111* 130* 88    Electrolytes  Recent Labs Lab 02/14/16 1404 02/15/16 0410 02/16/16 0513  CALCIUM 9.3 8.2* 8.6*  MG  --  1.9 2.3  PHOS  --  2.3* 3.2    CBC  Recent Labs Lab 02/14/16 1404 02/14/16 1408 02/15/16 0410 02/16/16 0513  WBC 17.9*  --  21.6* 21.0*  HGB 16.3 17.3* 14.6 15.0  HCT 50.1 51.0 45.6 47.8  PLT 152  --  145* 151    Coag's No results for input(s): APTT, INR in the last 168 hours.  Sepsis Markers  Recent Labs Lab 02/15/16 0041 02/15/16 0410 02/15/16 0701 02/16/16 0513  LATICACIDVEN 3.9* 2.8* 1.8  --   PROCALCITON  --   --   --  0.76    ABG  Recent Labs Lab 02/14/16 1818  PHART 7.305*  PCO2ART 40.1  PO2ART 86.7    Liver Enzymes  Recent Labs Lab 02/14/16 1404  AST 26  ALT 18  ALKPHOS 81  BILITOT 1.4*  ALBUMIN 4.2    Cardiac Enzymes  Recent Labs Lab 02/15/16 0410 02/15/16 0701 02/15/16 1306  TROPONINI 0.10* 0.11* 0.14*    Glucose No results for input(s): GLUCAP in the last 168 hours.  STUDIES:  CT abd, pelvis 7/30 >> Rectal distention due to large amount of stool with mild perirectal and presacral edema. CXR 7/30 >> COPD, bronchits  CULTURES: BCx 7/30 X 2 >>  ANTIBIOTICS: Vancomycin 7/30 >> 7/31 Zosyn  7/30 >>  SIGNIFICANT EVENTS: 7/30  Admit with abd pain, SIRS, constipation  LINES/TUBES:  DISCUSSION: 82 year old with h/o severe COPD.  Admitted with abdominal distention, worsening constipation for 3 days.  Initial concerns for sepsis with elevated lactic acid vs ischemia vs dehydration.  Surgery consulted, patient manually disimpacted + suppository, volume resuscitation.  Lactic acid cleared, mild AKI.  BM 7/31 with relief.    ASSESSMENT / PLAN:  PULMONARY A: Severe COPD on home O2. FEV1 23% in 2015 Increased dyspnea likely secondary from abdominal distention. P:   Continue supplemental O2 Continue outpatient Symbicort Duonebs PRN Pulmonary hygiene - IS, mobilize  CARDIOVASCULAR A:  Sinus tachycardia EKG shows non specific ST changes with normal troponin Mild systolic cardiomyopathy. EF 45-50% in 2015 Elevated LA P:  Reduce IVF NS to KVO Monitor LA and troponins  RENAL A:   Mild AKI P:   Monitor urine output and Cr  GASTROINTESTINAL A:   Severe constipation s/p manual disimpaction Concern for bowel ischemia - abd pain relieved with BM.  No further evidence of ischemia.  P:   Continue to monitor abdominal exam Dilaudid prn for abd pain. Will try to use sparingly to avoid worsening of constipation SMOG enema PRN  Change colace to senakot-S QD  HEMATOLOGIC A:   Leukocytosis - ? Stress response  Elevated Hb - likely hemoconcentrated P:  Trend CBC  INFECTIOUS A:   Concern for intra abdominal source of sepsis P:   Continue abx as above  Follow cultures Monitor PCT  NEUROLOGIC A:   Anxiety  P:  Monitor / supportive care  PRN xanxax  FAMILY: - Updates: Pt updated at bedside 8/1. Code status is DNR (confirmed with the patient) - Inter-disciplinary family meet or Palliative Care meeting due by:  8/6   Transfer to [facility] as of 8/2 am.  PCCM will remain on as pulmonary consultation.  Continues to have leukocytosis - stress response vs infectious process  (afebrile, PCT low).  Suspect that his constipation is driving current status.  Baseline severe lung disease. Lives at home with wife.  PT evaluation for weakness.     Noe Gens, NP-C [facility] Pgr: [phone] or if no answer [phone] 02/16/2016, 9:51 AM  Feels weak.  C/o cough and chest congestion >> wants tussionex.  Abdominal discomfort improved some.  Says he has been dealing with pain all his life.  Scattered rhonchi.  No wheeze.  HR regular.  Abd softer.  No edema.  Multiple ecchymoses.  Assessment/plan: Severe constipation. - explained role opiate medications are having with this - continue bowel regimen  ?sepsis from intra-abdominal source >> has leukocytosis, and mild elevation in procalcitonin. - continue Abx for now  Severe COPD with chronic cough. - change to brovana, pulmicort, with prn duoneb -  flutter valve - mucinex, tessalon - try to avoid opiate based cough medicines if able  Chronic systolic CHF. - KVO IV fluids  Chronic pain, anxiety. Deconditioning. - PT evaluation - continue celexa - prn tylenol, tramadol - prn dilaudid for severe pain only  Goals of care. - DNR/DNI  Will ask [facility] to assume primary care from 8/02 and PCCM will f/u as pulmonary consult.  Chesley Mires, MD [facility]monary/Critical [facility] 02/16/2016, 11:20 AM Pager:  [phone] After 3pm call: [phone]

## 2016-02-17 DIAGNOSIS — J441 Chronic obstructive pulmonary disease with (acute) exacerbation: Principal | ICD-10-CM

## 2016-02-17 DIAGNOSIS — Z66 Do not resuscitate: Secondary | ICD-10-CM

## 2016-02-17 DIAGNOSIS — J432 Centrilobular emphysema: Secondary | ICD-10-CM

## 2016-02-17 DIAGNOSIS — R109 Unspecified abdominal pain: Secondary | ICD-10-CM

## 2016-02-17 DIAGNOSIS — R1084 Generalized abdominal pain: Secondary | ICD-10-CM

## 2016-02-17 DIAGNOSIS — J9621 Acute and chronic respiratory failure with hypoxia: Secondary | ICD-10-CM

## 2016-02-17 DIAGNOSIS — J449 Chronic obstructive pulmonary disease, unspecified: Secondary | ICD-10-CM

## 2016-02-17 MED ORDER — ENOXAPARIN SODIUM 40 MG/0.4ML ~~LOC~~ SOLN
40.0000 mg | SUBCUTANEOUS | Status: DC
Start: 2016-02-17 — End: 2016-02-20
  Administered 2016-02-17 – 2016-02-19 (×3): 40 mg via SUBCUTANEOUS
  Filled 2016-02-17 (×3): qty 0.4

## 2016-02-17 MED ORDER — MORPHINE SULFATE ER 15 MG PO TBCR
15.0000 mg | EXTENDED_RELEASE_TABLET | Freq: Two times a day (BID) | ORAL | Status: DC
  Administered 2016-02-17 – 2016-02-20 (×6): 15 mg via ORAL
  Filled 2016-02-17 (×6): qty 1

## 2016-02-17 MED ORDER — IPRATROPIUM-ALBUTEROL 0.5-2.5 (3) MG/3ML IN SOLN
3.0000 mL | Freq: Four times a day (QID) | RESPIRATORY_TRACT | Status: DC
Start: 2016-02-17 — End: 2016-02-20
  Administered 2016-02-17 – 2016-02-20 (×10): 3 mL via RESPIRATORY_TRACT
  Filled 2016-02-17 (×10): qty 3

## 2016-02-17 MED ORDER — ALPRAZOLAM 0.5 MG PO TABS
0.5000 mg | ORAL_TABLET | Freq: Every day | ORAL | Status: DC
  Administered 2016-02-17 – 2016-02-19 (×3): 0.5 mg via ORAL
  Filled 2016-02-17 (×3): qty 1

## 2016-02-17 MED ORDER — FUROSEMIDE 10 MG/ML IJ SOLN
40.0000 mg | Freq: Once | INTRAMUSCULAR | Status: AC
  Administered 2016-02-17: 40 mg via INTRAVENOUS
  Filled 2016-02-17: qty 4

## 2016-02-17 MED ORDER — IPRATROPIUM-ALBUTEROL 0.5-2.5 (3) MG/3ML IN SOLN: 3.0000 mL | Freq: Four times a day (QID) | RESPIRATORY_TRACT | Status: DC

## 2016-02-17 MED ORDER — METHYLNALTREXONE BROMIDE 12 MG/0.6ML ~~LOC~~ SOLN
12.0000 mg | Freq: Once | SUBCUTANEOUS | Status: AC
  Administered 2016-02-17: 12 mg via SUBCUTANEOUS
  Filled 2016-02-17: qty 0.6

## 2016-02-17 MED ORDER — PREDNISONE 20 MG PO TABS
40.0000 mg | ORAL_TABLET | Freq: Every day | ORAL | Status: DC
  Administered 2016-02-18 – 2016-02-20 (×3): 40 mg via ORAL
  Filled 2016-02-17 (×3): qty 2

## 2016-02-17 MED ORDER — MORPHINE SULFATE (PF) 2 MG/ML IV SOLN
2.0000 mg | INTRAVENOUS | Status: DC | PRN
  Administered 2016-02-17 – 2016-02-20 (×19): 2 mg via INTRAVENOUS
  Filled 2016-02-17 (×19): qty 1

## 2016-02-17 MED ORDER — ALBUTEROL SULFATE (2.5 MG/3ML) 0.083% IN NEBU: 2.5000 mg | INHALATION_SOLUTION | RESPIRATORY_TRACT | Status: DC | PRN

## 2016-02-17 MED ORDER — LORAZEPAM 0.5 MG PO TABS: 0.5000 mg | ORAL_TABLET | ORAL | Status: DC | PRN

## 2016-02-17 NOTE — Progress Notes (Signed)
PROGRESS NOTE    [name]  [id number] DOB: 1933/12/04 DOA: 02/14/2016 PCP: Kandice Hams, MD   Brief Narrative:  [name] is an 82 year old gentleman with a history of severe chronic obstructive pulmonary disease, at baseline uses 4-5 L of supplemental oxygen via nasal cannula, admitted to the pulmonary critical care medicine service on 02/14/2016 when he presented with complaints of shortness of breath. It was felt that severe constipation contributing to shortness of breath, undergoing manual disimpaction.    Assessment & Plan:   Principal Problem:   COPD exacerbation ([place]) Active Problems:   Acute on chronic respiratory failure (HCC)   COPD GOLD IV/ 02 dep   Protein-calorie malnutrition (HCC)   Multifocal atrial tachycardia (HCC)   Constipation   1.  Chronic active pulmonary disease exacerbation -Patient having a history of probable end-stage COPD, oxygen dependent requiring 4-5 L of supplemental oxygen, having a FEV1 of 23% in 2015 -On my assessment on 02/17/2016 patient appears worse, placed on Venturi mask -Pulmonary critical care medicine following, he is on prednisone, antibiotics, nebulizer treatments  2.  Severe constipation. -CT imaging showing rectal distention due to large amount of stool -He underwent rectal disimpaction -Narcotic analgesics likely contributors, on Relistor  3.  Acute on chronic respiratory failure -Patient with probable end-stage COPD, having worsening shortness of breath requiring Venturi mask today -Pulmonary critical care medicine consulted, continue supportive care -Reviewed ins and outs, has positive fluid balance of 3 L, will provide 40 mg of IV Lasix now as fluid overload may be a contributor. He received IV fluids earlier during this hospitalization  4.  Medical goals of care -Family members expressing concern for deteriorating clinical status. We discussed the advanced stage of his COPD and what quality-of-life means to  him.They would like to focus his care on comfort and request a palliative care consultation. Palliative care consultation placed   DVT prophylaxis: Lovenox Code Status: DO NOT RESUSCITATE Family Communication: I spoke to multiple family members at bedside Disposition Plan:   Consultants:   Pulmonary critical care medicine  Palliative medicine  Procedures:     Antimicrobials:   Zosyn    Subjective: Patient is on a Venturi mask, reports feeling more short of breath today  Objective: Vitals:   02/16/16 2151 02/16/16 2154 02/17/16 0500 02/17/16 0900  BP:   (!) 170/99   Pulse:   (!) 111   Resp:      Temp:   98.7 F (37.1 C)   TempSrc:   Oral   SpO2: 95% 95% 96% 95%  Weight:   88.1 kg (194 lb 3.2 oz)   Height:        Intake/Output Summary (Last 24 hours) at 02/17/16 1603 Last data filed at 02/17/16 0846  Gross per 24 hour  Intake              330 ml  Output              250 ml  Net               80 ml   Filed Weights   02/14/16 2250 02/16/16 0500 02/17/16 0500  Weight: 86.7 kg (191 lb 2.2 oz) 91.2 kg (201 lb 1 oz) 88.1 kg (194 lb 3.2 oz)    Examination:  General exam: Chronically ill-appearing, with Venturi mask Respiratory system: There is diminished breath sounds bilaterally, having bilateral crackles at bases Cardiovascular system: S1 & S2 heard, irregular rate and rhythm. No JVD, murmurs, rubs, gallops  or clicks. No pedal edema. Gastrointestinal system: Abdomen is nondistended, soft and nontender. No organomegaly or masses felt. Normal bowel sounds heard. Central nervous system: Alert and oriented. No focal neurological deficits. Extremities: Symmetric 5 x 5 power. Skin: No rashes, lesions or ulcers Psychiatry: Judgement and insight appear normal. Mood & affect appropriate.     Data Reviewed: I have personally reviewed following labs and imaging studies  CBC:  Recent Labs Lab 02/14/16 1404 02/14/16 1408 02/15/16 0410 02/16/16 0513 02/16/16 1052   WBC 17.9*  --  21.6* 21.0* 21.8*  NEUTROABS  --   --   --   --  18.6*  HGB 16.3 17.3* 14.6 15.0 15.2  HCT 50.1 51.0 45.6 47.8 48.1  MCV 94.5  --  95.8 96.6 96.8  PLT 152  --  145* 151 [id number]   Basic Metabolic Panel:  Recent Labs Lab 02/14/16 1404 02/14/16 1408 02/15/16 0410 02/16/16 0513 02/16/16 1052  NA 141 141 141 142 142  K 3.9 3.9 4.2 4.6 4.3  CL 106 105 114* 112* 112*  CO2 25  --  20* 22 21*  GLUCOSE 111* 111* 130* 88 77  BUN 27* 29* 32* 41* 40*  CREATININE 1.27* 1.20 1.51* 1.43* 1.33*  CALCIUM 9.3  --  8.2* 8.6* 8.5*  MG  --   --  1.9 2.3  --   PHOS  --   --  2.3* 3.2  --    GFR: Estimated Creatinine Clearance: 48.6 mL/min (by C-G formula based on SCr of 1.33 mg/dL). Liver Function Tests:  Recent Labs Lab 02/14/16 1404  AST 26  ALT 18  ALKPHOS 81  BILITOT 1.4*  PROT 7.0  ALBUMIN 4.2   No results for input(s): LIPASE, AMYLASE in the last 168 hours. No results for input(s): AMMONIA in the last 168 hours. Coagulation Profile: No results for input(s): INR, PROTIME in the last 168 hours. Cardiac Enzymes:  Recent Labs Lab 02/15/16 0410 02/15/16 0701 02/15/16 1306  TROPONINI 0.10* 0.11* 0.14*   BNP (last 3 results) No results for input(s): PROBNP in the last 8760 hours. HbA1C: No results for input(s): HGBA1C in the last 72 hours. CBG: No results for input(s): GLUCAP in the last 168 hours. Lipid Profile: No results for input(s): CHOL, HDL, LDLCALC, TRIG, CHOLHDL, LDLDIRECT in the last 72 hours. Thyroid Function Tests: No results for input(s): TSH, T4TOTAL, FREET4, T3FREE, THYROIDAB in the last 72 hours. Anemia Panel: No results for input(s): VITAMINB12, FOLATE, FERRITIN, TIBC, IRON, RETICCTPCT in the last 72 hours. Sepsis Labs:  Recent Labs Lab 02/14/16 2125 02/15/16 0041 02/15/16 0410 02/15/16 0701 02/16/16 0513  PROCALCITON  --   --   --   --  0.76  LATICACIDVEN 3.5* 3.9* 2.8* 1.8  --     Recent Results (from the past 240 hour(s))  Blood  culture (routine x 2)     Status: None (Preliminary result)   Collection Time: 02/14/16  2:05 PM  Result Value Ref Range Status   Specimen Description BLOOD BLOOD RIGHT FOREARM  Final   Special Requests BOTTLES DRAWN AEROBIC AND ANAEROBIC 10CC  Final   Culture   Final    NO GROWTH 3 DAYS Performed at [facility]    Report Status PENDING  Incomplete  Blood culture (routine x 2)     Status: None (Preliminary result)   Collection Time: 02/14/16  4:24 PM  Result Value Ref Range Status   Specimen Description BLOOD RIGHT [facility]  Final   Special  Requests BOTTLES DRAWN AEROBIC AND ANAEROBIC 5CC  Final   Culture   Final    NO GROWTH 3 DAYS Performed at [facility]    Report Status PENDING  Incomplete  MRSA PCR Screening     Status: None   Collection Time: 02/14/16 10:48 PM  Result Value Ref Range Status   MRSA by PCR NEGATIVE NEGATIVE Final    Comment:        The GeneXpert MRSA Assay (FDA approved for NASAL specimens only), is one component of a comprehensive MRSA colonization surveillance program. It is not intended to diagnose MRSA infection nor to guide or monitor treatment for MRSA infections.          Radiology Studies: Dg Chest Port 1 View  Result Date: 02/16/2016 CLINICAL DATA:  Sob, cough, hx of copd EXAM: PORTABLE CHEST 1 VIEW COMPARISON:  02/14/2016 FINDINGS: Apical lordotic positioning. The heart is enlarged. Perihilar bronchitic changes are again noted. Emphysematous changes are present. No new consolidations or definite pleural effusions identified. IMPRESSION: No evidence for acute abnormality. Bronchitic changes and emphysema. Electronically Signed   By: Nolon Nations M.D.   On: 02/16/2016 10:45        Scheduled Meds: . ALPRAZolam  0.5 mg Oral QHS  . budesonide (PULMICORT) nebulizer solution  0.5 mg Nebulization BID  . citalopram  20 mg Oral Daily  . guaiFENesin  1,200 mg Oral BID  . ipratropium-albuterol  3 mL Nebulization Q6H  .  methylnaltrexone  12 mg Subcutaneous Once  . morphine  15 mg Oral Q12H  . piperacillin-tazobactam (ZOSYN)  IV  3.375 g Intravenous Q8H  . [START ON 02/18/2016] predniSONE  40 mg Oral Q breakfast  . senna-docusate  2 tablet Oral Daily   Continuous Infusions: . sodium chloride 10 mL/hr at 02/16/16 1118     LOS: 3 days    Time spent: 35 minutes    Kelvin Cellar, MD [facility] Pager [phone]  If 7PM-7AM, please contact night-coverage [email or web address] Password [email or web address] 02/17/2016, 4:03 PM

## 2016-02-17 NOTE — Progress Notes (Signed)
Dr. Hilma Favors recommend residential Hospice.  Family selected [facility].  Referral called to in house rep with [facility].

## 2016-02-17 NOTE — Progress Notes (Signed)
PULMONARY / CRITICAL CARE MEDICINE   Name: [name] MRN: [id number] DOB: 30-Jun-1934    ADMISSION DATE:  02/14/2016 CONSULTATION DATE:  02/14/16  REFERRING MD:  ED  CHIEF COMPLAINT:  Constipation, dyspnea.  SUBJECTIVE:   Feels more short of breath.  Feels like he needs more pain medicine.  VITAL SIGNS: BP (!) 170/99 (BP Location: Left Arm)   Pulse (!) 111   Temp 98.7 F (37.1 C) (Oral)   Resp 20   Ht 6' (1.829 m)   Wt 194 lb 3.2 oz (88.1 kg)   SpO2 95%   BMI 26.34 kg/m   INTAKE / OUTPUT: I/O last 3 completed shifts: In: 1917.5 [P.O.:600; I.V.:1017.5; IV Piggyback:300] Out: 975 [Urine:975]  PHYSICAL EXAMINATION: General: ill appearing, using accessory muscles Neuro: sleepy, follows commands HEENT: no stridor Cardiac: regular, tachycardic Chest: poor air movement, decreased BS, no wheeze Abd: soft, non tender Ext: 1+ edema, decrease muscle bulk Skin: multiple areas of ecchymosis  LABS: CMP Latest Ref Rng & Units 02/16/2016 02/16/2016 02/15/2016  Glucose 65 - 99 mg/dL 77 88 130(H)  BUN 6 - 20 mg/dL 40(H) 41(H) 32(H)  Creatinine 0.61 - 1.24 mg/dL 1.33(H) 1.43(H) 1.51(H)  Sodium 135 - 145 mmol/L 142 142 141  Potassium 3.5 - 5.1 mmol/L 4.3 4.6 4.2  Chloride 101 - 111 mmol/L 112(H) 112(H) 114(H)  CO2 22 - 32 mmol/L 21(L) 22 20(L)  Calcium 8.9 - 10.3 mg/dL 8.5(L) 8.6(L) 8.2(L)  Total Protein 6.5 - 8.1 g/dL - - -  Total Bilirubin 0.3 - 1.2 mg/dL - - -  Alkaline Phos 38 - 126 U/L - - -  AST 15 - 41 U/L - - -  ALT 17 - 63 U/L - - -    CBC Latest Ref Rng & Units 02/16/2016 02/16/2016 02/15/2016  WBC 4.0 - 10.5 K/uL 21.8(H) 21.0(H) 21.6(H)  Hemoglobin 13.0 - 17.0 g/dL 15.2 15.0 14.6  Hematocrit 39.0 - 52.0 % 48.1 47.8 45.6  Platelets 150 - 400 K/uL 170 151 145(L)    IMAGING: Dg Chest Port 1 View  Result Date: 02/16/2016 CLINICAL DATA:  Sob, cough, hx of copd EXAM: PORTABLE CHEST 1 VIEW COMPARISON:  02/14/2016 FINDINGS: Apical lordotic positioning. The heart is  enlarged. Perihilar bronchitic changes are again noted. Emphysematous changes are present. No new consolidations or definite pleural effusions identified. IMPRESSION: No evidence for acute abnormality. Bronchitic changes and emphysema. Electronically Signed   By: Nolon Nations M.D.   On: 02/16/2016 10:45    STUDIES:  CT abd, pelvis 7/30 >> Rectal distention due to large amount of stool with mild perirectal and presacral edema.  CULTURES: BCx 7/30 >>  ANTIBIOTICS: Vancomycin 7/30 >> 7/31 Zosyn 7/30 >>  SIGNIFICANT EVENTS: 7/30 Admit with abd pain, SIRS, constipation 7/31 to telemetry  8/02 palliative care consulted  LINES/TUBES:  DISCUSSION: 82 yo male presented with abdominal distention, severe constipation, and concern for abdominal sepsis.  He has hx of severe COPD/emphysema.  He is on chronic opiate therapy.  ASSESSMENT / PLAN:  Severe COPD with emphysema. Acute on chronic hypoxic respiratory failure. - continue duoneb, pulmicort - oxygen to keep SpO2 90 to 95%  Chronic pain, anxiety. Goals of care. - DNR/DNI - agree with plan for palliative care assessment  Severe constipation. ?sepsis from intra-abdominal source >> has leukocytosis, and mild elevation in procalcitonin. - explained role opiate medications are having with this - continue bowel regimen - continue Abx for now  Chronic systolic CHF. - KVO IV fluids  Updated  pt's family at bedside.  D/w Dr. Coralyn Pear.  Chesley Mires, MD [facility] Pulmonary/Critical Care 02/17/2016, 11:17 AM Pager:  [phone] After 3pm call: [phone]

## 2016-02-17 NOTE — Progress Notes (Signed)
RT re-instructed Flutter device. PT demonstrated verbal and hands on understanding of Flutter- expiratory performance is weak to modest.

## 2016-02-17 NOTE — Progress Notes (Signed)
PT Cancellation Note  Patient Details Name: [name] MRN: [id number] DOB: 1934/01/22   Cancelled Treatment:    Reason Eval/Treat Not Completed: Other (comment); spoke with RN, not the best time to see pt--recommends we defer for today and check back tomorrow or as schedule permits   [facility] 02/17/2016, 12:18 PM

## 2016-02-17 NOTE — Consult Note (Signed)
[facility] [employer]  Received request from [place] for family interest in [facility]. Chart reviewed and spoke with spouse by phone to confirm interest and answer questions. She is aware [facility] Liaison will follow up in am re availability/eligibilty.   Thank you, Erling Conte, LCSW [phone]

## 2016-02-17 NOTE — Progress Notes (Addendum)
ANTICOAGULATION CONSULT NOTE - Initial Consult  Pharmacy Consult for lovenox Indication: VTE prophylaxis  No Known Allergies  Patient Measurements: Height: 6' (182.9 cm) Weight: 194 lb 3.2 oz (88.1 kg) IBW/kg (Calculated) : 77.6 Heparin Dosing Weight:   Vital Signs: Temp: 98.7 F (37.1 C) (08/02 0500) Temp Source: Oral (08/02 0500) BP: 170/99 (08/02 0500) Pulse Rate: 111 (08/02 0500)  Labs:  Recent Labs  02/15/16 0410 02/15/16 0701 02/15/16 1306 02/16/16 0513 02/16/16 1052  HGB 14.6  --   --  15.0 15.2  HCT 45.6  --   --  47.8 48.1  PLT 145*  --   --  151 170  CREATININE 1.51*  --   --  1.43* 1.33*  TROPONINI 0.10* 0.11* 0.14*  --   --     Estimated Creatinine Clearance: 48.6 mL/min (by C-G formula based on SCr of 1.33 mg/dL).   Medical History: Past Medical History:  Diagnosis Date  . Anxiety   . Asthma   . COPD (chronic obstructive pulmonary disease) ([place])   . Depression   . Emphysema of lung ([place])   . Hypertension   . Multiple lung nodules   . On home O2    3L N/C continuous  . Oxygen deficiency   . Shortness of breath dyspnea     Assessment: Patient is an 82 y.o M presented to the ED on 7/30 with c/o constipation and SOB.  To start lovenox for VTE prophylaxis.  Scr improving with crcl~48 mL/min.  CBC relatively stable.     Plan:  - lovenox 40 mg SQ q24h (next dose due at 10PM since last SQ heparin dose given at ~3PM today - pharmacy will sign off - re-consult [place] if need further assistance  Kinaya Hilliker P 02/17/2016,4:00 PM

## 2016-02-18 NOTE — Progress Notes (Signed)
PROGRESS NOTE    [name]  [id number] DOB: February 01, 1934 DOA: 02/14/2016 PCP: Kandice Hams, MD   Brief Narrative:  [name] is an 82 year old gentleman with a history of severe chronic obstructive pulmonary disease, at baseline uses 4-5 L of supplemental oxygen via nasal cannula, admitted to the pulmonary critical care medicine service on 02/14/2016 when he presented with complaints of shortness of breath. It was felt that severe constipation contributing to shortness of breath, undergoing manual disimpaction.    Assessment & Plan:   Principal Problem:   COPD exacerbation ([place]) Active Problems:   Acute on chronic respiratory failure (HCC)   COPD GOLD IV/ 02 dep   Protein-calorie malnutrition (HCC)   Multifocal atrial tachycardia (HCC)   Constipation   Abdominal pain, diffuse   1.  Chronic active pulmonary disease exacerbation -Patient having a history of probable end-stage COPD, oxygen dependent requiring 4-5 L of supplemental oxygen, having a FEV1 of 23% in 2015 -On my assessment on 02/17/2016 patient appears worse, placed on Venturi mask -Pulmonary critical care medicine following, he is on prednisone, antibiotics, nebulizer treatments  2.  Severe constipation. -CT imaging showing rectal distention due to large amount of stool -He underwent rectal disimpaction -Narcotic analgesics likely contributors, on Relistor  3.  Acute on chronic respiratory failure -Patient with probable end-stage COPD, having worsening shortness of breath requiring Venturi mask today -Pulmonary critical care medicine consulted, continue supportive care -On 02/17/2016: Reviewed ins and outs, has positive fluid balance of 3 L, will provide 40 mg of IV Lasix now as fluid overload may be a contributor. He received IV fluids earlier during this hospitalization -On 02/18/2016 patient seems improved from a respiratory standpoint, he appears more comfortable  4.  Medical goals of care -Family  members expressing concern for deteriorating clinical status. We discussed the advanced stage of his COPD and what quality-of-life means to him.They would like to focus his care on comfort and request a palliative care consultation. Palliative care consultation placed  Awaiting placement to residential hospice.    DVT prophylaxis: Lovenox Code Status: DO NOT RESUSCITATE Family Communication: I spoke to multiple family members at bedside Disposition Plan:   Consultants:   Pulmonary critical care medicine  Palliative medicine  Procedures:     Antimicrobials:   Zosyn    Subjective: Looks better overall, interim improvement to respiratory status.  Objective: Vitals:   02/17/16 2150 02/18/16 0556 02/18/16 0906 02/18/16 1445  BP: 136/65 127/79    Pulse: (!) 55 94  63  Resp: 20 20  20   Temp: 98.5 F (36.9 C) 97.9 F (36.6 C)  98.2 F (36.8 C)  TempSrc: Oral Oral  Oral  SpO2: 99% 98% 96% 97%  Weight:      Height:        Intake/Output Summary (Last 24 hours) at 02/18/16 1811 Last data filed at 02/18/16 0622  Gross per 24 hour  Intake           463.67 ml  Output             1050 ml  Net          -586.33 ml   Filed Weights   02/14/16 2250 02/16/16 0500 02/17/16 0500  Weight: 86.7 kg (191 lb 2.2 oz) 91.2 kg (201 lb 1 oz) 88.1 kg (194 lb 3.2 oz)    Examination:  General exam: Chronically ill-appearing,On nasal cannula now Respiratory system: There is diminished breath sounds bilaterally, interim improvement to lung exam with decreased  crackles Cardiovascular system: S1 & S2 heard, irregular rate and rhythm. No JVD, murmurs, rubs, gallops or clicks. No pedal edema. Gastrointestinal system: Abdomen is nondistended, soft and nontender. No organomegaly or masses felt. Normal bowel sounds heard. Central nervous system: Alert and oriented. No focal neurological deficits. Extremities: Symmetric 5 x 5 power. Skin: No rashes, lesions or ulcers Psychiatry: Judgement and  insight appear normal. Mood & affect appropriate.     Data Reviewed: I have personally reviewed following labs and imaging studies  CBC:  Recent Labs Lab 02/14/16 1404 02/14/16 1408 02/15/16 0410 02/16/16 0513 02/16/16 1052  WBC 17.9*  --  21.6* 21.0* 21.8*  NEUTROABS  --   --   --   --  18.6*  HGB 16.3 17.3* 14.6 15.0 15.2  HCT 50.1 51.0 45.6 47.8 48.1  MCV 94.5  --  95.8 96.6 96.8  PLT 152  --  145* 151 [id number]   Basic Metabolic Panel:  Recent Labs Lab 02/14/16 1404 02/14/16 1408 02/15/16 0410 02/16/16 0513 02/16/16 1052  NA 141 141 141 142 142  K 3.9 3.9 4.2 4.6 4.3  CL 106 105 114* 112* 112*  CO2 25  --  20* 22 21*  GLUCOSE 111* 111* 130* 88 77  BUN 27* 29* 32* 41* 40*  CREATININE 1.27* 1.20 1.51* 1.43* 1.33*  CALCIUM 9.3  --  8.2* 8.6* 8.5*  MG  --   --  1.9 2.3  --   PHOS  --   --  2.3* 3.2  --    GFR: Estimated Creatinine Clearance: 48.6 mL/min (by C-G formula based on SCr of 1.33 mg/dL). Liver Function Tests:  Recent Labs Lab 02/14/16 1404  AST 26  ALT 18  ALKPHOS 81  BILITOT 1.4*  PROT 7.0  ALBUMIN 4.2   No results for input(s): LIPASE, AMYLASE in the last 168 hours. No results for input(s): AMMONIA in the last 168 hours. Coagulation Profile: No results for input(s): INR, PROTIME in the last 168 hours. Cardiac Enzymes:  Recent Labs Lab 02/15/16 0410 02/15/16 0701 02/15/16 1306  TROPONINI 0.10* 0.11* 0.14*   BNP (last 3 results) No results for input(s): PROBNP in the last 8760 hours. HbA1C: No results for input(s): HGBA1C in the last 72 hours. CBG: No results for input(s): GLUCAP in the last 168 hours. Lipid Profile: No results for input(s): CHOL, HDL, LDLCALC, TRIG, CHOLHDL, LDLDIRECT in the last 72 hours. Thyroid Function Tests: No results for input(s): TSH, T4TOTAL, FREET4, T3FREE, THYROIDAB in the last 72 hours. Anemia Panel: No results for input(s): VITAMINB12, FOLATE, FERRITIN, TIBC, IRON, RETICCTPCT in the last 72  hours. Sepsis Labs:  Recent Labs Lab 02/14/16 2125 02/15/16 0041 02/15/16 0410 02/15/16 0701 02/16/16 0513  PROCALCITON  --   --   --   --  0.76  LATICACIDVEN 3.5* 3.9* 2.8* 1.8  --     Recent Results (from the past 240 hour(s))  Blood culture (routine x 2)     Status: None (Preliminary result)   Collection Time: 02/14/16  2:05 PM  Result Value Ref Range Status   Specimen Description BLOOD BLOOD RIGHT FOREARM  Final   Special Requests BOTTLES DRAWN AEROBIC AND ANAEROBIC 10CC  Final   Culture   Final    NO GROWTH 4 DAYS Performed at [facility]    Report Status PENDING  Incomplete  Blood culture (routine x 2)     Status: None (Preliminary result)   Collection Time: 02/14/16  4:24 PM  Result  Value Ref Range Status   Specimen Description BLOOD RIGHT [facility]  Final   Special Requests BOTTLES DRAWN AEROBIC AND ANAEROBIC 5CC  Final   Culture   Final    NO GROWTH 4 DAYS Performed at [facility]    Report Status PENDING  Incomplete  MRSA PCR Screening     Status: None   Collection Time: 02/14/16 10:48 PM  Result Value Ref Range Status   MRSA by PCR NEGATIVE NEGATIVE Final    Comment:        The GeneXpert MRSA Assay (FDA approved for NASAL specimens only), is one component of a comprehensive MRSA colonization surveillance program. It is not intended to diagnose MRSA infection nor to guide or monitor treatment for MRSA infections.          Radiology Studies: No results found.      Scheduled Meds: . ALPRAZolam  0.5 mg Oral QHS  . budesonide (PULMICORT) nebulizer solution  0.5 mg Nebulization BID  . citalopram  20 mg Oral Daily  . enoxaparin (LOVENOX) injection  40 mg Subcutaneous Q24H  . guaiFENesin  1,200 mg Oral BID  . ipratropium-albuterol  3 mL Nebulization Q6H  . morphine  15 mg Oral Q12H  . predniSONE  40 mg Oral Q breakfast  . senna-docusate  2 tablet Oral Daily   Continuous Infusions: . sodium chloride 10 mL/hr at 02/17/16 1901      LOS: 4 days    Time spent: 15 minutes    Kelvin Cellar, MD [facility] Pager [phone]  If 7PM-7AM, please contact night-coverage [email or web address] Password [email or web address] 02/18/2016, 6:11 PM

## 2016-02-18 NOTE — Consult Note (Signed)
   [facility] CM Inpatient Consult   02/18/2016  [name] [birth year] [id number]    Patient screened for potential [facility] Care Management services. Chart reviewed. Noted discharge plan is for residential hospice facility.  There are no identifiable [facility] Care Management needs at this time. Spoke with inpatient Licensed CSW to confirm disposition plans.    Marthenia Rolling, MSN-Ed, RN,BSN [facility] Liaison [phone]

## 2016-02-18 NOTE — Consult Note (Signed)
Met with patient and his wife to discuss goals of care and to assist with symptom management in end stage COPD with functional status decline and worsening respiratory failure.   Increasingly difficult for patient to remain at home due to dyspnea, limited mobility and pain.  Admitted with abdominal pain due to severe opiate induced constipation.  Symptom Recs:  Audibly wheezing during my evaluation  Scheduled duonebs instead of PRN, added on oral prednisone  Dyspnea due to advanced COPD  Started MSContin 15 with IV morphine PRN for dyspnea  Acute on Chronic Pain "generalizized" was taking relatively high doses oxycodone at home prior to admission  Hopefully MS Contin will help- may consider adjuvants  Constipation: has not responded to traditional laxatives  Start Relistor QOD at least while in hospital and can transition to standard regimen once severe constipation is relieved.  Agitation, Confusion  Getting worse per his wife  Schedule PM alprazolam and use PRN ativan low dose for now  Prognosis: Advanced COPD, difficult to fully prognosticate but he is extremely fragile- could be days to weeks.  Disposition: Difficult decision making process, wife in his current condition is unable to care for him at home-he wants to be at home. They are agreeable to hospice care. They have financial stress. I suggested the possibility of going to a hospice facility for specific attention to sympytom management and if he stabilizes they coiuld again address the possibility of home with hospice. I prepared them also that he is sick enough that he may be quickly approaching EOL and a hospice facility could best care for his evolving needs.  PPS 30%  Lane Hacker, DO Palliative Medicine  Time: 2PM-3PM Total Time: 60 minutes Greater than 50%  of this time was spent counseling and coordinating care related to the above assessment and plan.

## 2016-02-18 NOTE — Progress Notes (Signed)
PT Cancellation Note  Patient Details Name: [name] MRN: [id number] DOB: Jan 10, 1934   Cancelled Treatment:    Reason Eval/Treat Not Completed: PT screened, no needs identified, will sign off; Per notes Dr. Hilma Favors recommends [facility]; PT will sign off.   [facility] 02/18/2016, 8:57 AM

## 2016-02-18 NOTE — Progress Notes (Signed)
[place] with patient and son at bedside re: family interest in [facility].  Unfortunately, there are no beds available for placement today.  Will continue to follow and update regarding eligibility and bed availability.  Updated CSW, Claiborne Billings.  Please call with any questions.  Thank you, Freddi Starr RN, BSN [phone]

## 2016-02-18 NOTE — Progress Notes (Signed)
CSW received call from [place] at [facility] that they have no beds available today. CSW met with patient's son, [name] at bedside & provided list - son requested that [place] check with [facility]. CSW made referral to Orbie Pyo with [facility], awaiting call back re: bed availability/eligibility.    Raynaldo Opitz, [facility] Clinical Social Worker cell #: [phone]

## 2016-02-19 DIAGNOSIS — R0603 Acute respiratory distress: Secondary | ICD-10-CM

## 2016-02-19 DIAGNOSIS — R0600 Dyspnea, unspecified: Secondary | ICD-10-CM

## 2016-02-19 LAB — CULTURE, BLOOD (ROUTINE X 2)
CULTURE: NO GROWTH
Culture: NO GROWTH

## 2016-02-19 MED ORDER — HYDROCOD POLST-CPM POLST ER 10-8 MG/5ML PO SUER
5.0000 mL | Freq: Two times a day (BID) | ORAL | Status: DC | PRN
  Administered 2016-02-19: 5 mL via ORAL
  Filled 2016-02-19: qty 5

## 2016-02-19 NOTE — Progress Notes (Signed)
[place] Liaison  Continue to follow for family interest in [facility]. Unfortunately [facility] is not able to offer room today. CSW [place] aware. Will continue to follow until disposition determined. Thank you, Erling Conte, LCSW [phone]

## 2016-02-19 NOTE — Progress Notes (Signed)
[facility] room available for [name] tomorrow. Spouse completed paper work this evening for transfer tomorrow. Dr. Orpah Melter to assume care per spouse's Preference. CSW [place] aware.   Please fax discharge summary to [phone].  RN please call report to [phone].  Thank you,  Dalton Conte, LCSW [phone]

## 2016-02-19 NOTE — Progress Notes (Signed)
PROGRESS NOTE    [name]  [id number] DOB: 06-11-1934 DOA: 02/14/2016 PCP: Kandice Hams, MD   Brief Narrative:  [name] is an 82 year old gentleman with a history of severe chronic obstructive pulmonary disease, at baseline uses 4-5 L of supplemental oxygen via nasal cannula, admitted to the pulmonary critical care medicine service on 02/14/2016 when he presented with complaints of shortness of breath. It was felt that severe constipation contributing to shortness of breath, undergoing manual disimpaction.    Assessment & Plan:   Principal Problem:   COPD exacerbation ([place]) Active Problems:   Acute on chronic respiratory failure (HCC)   COPD GOLD IV/ 02 dep   Protein-calorie malnutrition (HCC)   Multifocal atrial tachycardia (HCC)   Constipation   Abdominal pain, diffuse   1.  Chronic active pulmonary disease exacerbation -Patient having a history of probable end-stage COPD, oxygen dependent requiring 4-5 L of supplemental oxygen, having a FEV1 of 23% in 2015 -He is stable from a respiratory standpoint  2.  Severe constipation. -Narcotic analgesics likely contributors, on Relistor  3.  Acute on chronic respiratory failure -Patient with probable end-stage COPD, having worsening shortness of breath requiring Venturi mask today -Pulmonary critical care medicine consulted, continue supportive care -On 02/17/2016: Reviewed ins and outs, has positive fluid balance of 3 L, will provide 40 mg of IV Lasix now as fluid overload may be a contributor. He received IV fluids earlier during this hospitalization -On 02/18/2016 patient seems improved from a respiratory standpoint, he appears more comfortable  4.  Medical goals of care -Family members expressing concern for deteriorating clinical status. We discussed the advanced stage of his COPD and what quality-of-life means to him.They would like to focus his care on comfort and request a palliative care consultation.  Palliative care consultation placed  Awaiting placement to residential hospice.    DVT prophylaxis: Lovenox Code Status: DO NOT RESUSCITATE Family Communication: I spoke to multiple family members at bedside Disposition Plan:   Consultants:   Pulmonary critical care medicine  Palliative medicine  Procedures:     Antimicrobials:   Zosyn    Subjective: Asking for IV morphine, reports his breathing is "fine"  Objective: Vitals:   02/19/16 0522 02/19/16 0815 02/19/16 1331 02/19/16 1427  BP: 131/79  134/68   Pulse: 99  67   Resp: 16  18   Temp: 98.2 F (36.8 C)  98.1 F (36.7 C)   TempSrc: Oral  Oral   SpO2: 99% 95% 91% 96%  Weight:      Height:        Intake/Output Summary (Last 24 hours) at 02/19/16 1541 Last data filed at 02/19/16 1300  Gross per 24 hour  Intake           546.33 ml  Output              550 ml  Net            -3.67 ml   Filed Weights   02/14/16 2250 02/16/16 0500 02/17/16 0500  Weight: 86.7 kg (191 lb 2.2 oz) 91.2 kg (201 lb 1 oz) 88.1 kg (194 lb 3.2 oz)    Examination:  General exam: Chronically ill-appearing,On nasal cannula now Respiratory system: There is diminished breath sounds bilaterally, interim improvement to lung exam with decreased crackles Cardiovascular system: S1 & S2 heard, irregular rate and rhythm. No JVD, murmurs, rubs, gallops or clicks. No pedal edema. Gastrointestinal system: Abdomen is nondistended, soft and nontender. No organomegaly or  masses felt. Normal bowel sounds heard. Central nervous system: Alert and oriented. No focal neurological deficits. Extremities: Symmetric 5 x 5 power. Skin: No rashes, lesions or ulcers Psychiatry: Judgement and insight appear normal. Mood & affect appropriate.     Data Reviewed: I have personally reviewed following labs and imaging studies  CBC:  Recent Labs Lab 02/14/16 1404 02/14/16 1408 02/15/16 0410 02/16/16 0513 02/16/16 1052  WBC 17.9*  --  21.6* 21.0* 21.8*    NEUTROABS  --   --   --   --  18.6*  HGB 16.3 17.3* 14.6 15.0 15.2  HCT 50.1 51.0 45.6 47.8 48.1  MCV 94.5  --  95.8 96.6 96.8  PLT 152  --  145* 151 [id number]   Basic Metabolic Panel:  Recent Labs Lab 02/14/16 1404 02/14/16 1408 02/15/16 0410 02/16/16 0513 02/16/16 1052  NA 141 141 141 142 142  K 3.9 3.9 4.2 4.6 4.3  CL 106 105 114* 112* 112*  CO2 25  --  20* 22 21*  GLUCOSE 111* 111* 130* 88 77  BUN 27* 29* 32* 41* 40*  CREATININE 1.27* 1.20 1.51* 1.43* 1.33*  CALCIUM 9.3  --  8.2* 8.6* 8.5*  MG  --   --  1.9 2.3  --   PHOS  --   --  2.3* 3.2  --    GFR: Estimated Creatinine Clearance: 48.6 mL/min (by C-G formula based on SCr of 1.33 mg/dL). Liver Function Tests:  Recent Labs Lab 02/14/16 1404  AST 26  ALT 18  ALKPHOS 81  BILITOT 1.4*  PROT 7.0  ALBUMIN 4.2   No results for input(s): LIPASE, AMYLASE in the last 168 hours. No results for input(s): AMMONIA in the last 168 hours. Coagulation Profile: No results for input(s): INR, PROTIME in the last 168 hours. Cardiac Enzymes:  Recent Labs Lab 02/15/16 0410 02/15/16 0701 02/15/16 1306  TROPONINI 0.10* 0.11* 0.14*   BNP (last 3 results) No results for input(s): PROBNP in the last 8760 hours. HbA1C: No results for input(s): HGBA1C in the last 72 hours. CBG: No results for input(s): GLUCAP in the last 168 hours. Lipid Profile: No results for input(s): CHOL, HDL, LDLCALC, TRIG, CHOLHDL, LDLDIRECT in the last 72 hours. Thyroid Function Tests: No results for input(s): TSH, T4TOTAL, FREET4, T3FREE, THYROIDAB in the last 72 hours. Anemia Panel: No results for input(s): VITAMINB12, FOLATE, FERRITIN, TIBC, IRON, RETICCTPCT in the last 72 hours. Sepsis Labs:  Recent Labs Lab 02/14/16 2125 02/15/16 0041 02/15/16 0410 02/15/16 0701 02/16/16 0513  PROCALCITON  --   --   --   --  0.76  LATICACIDVEN 3.5* 3.9* 2.8* 1.8  --     Recent Results (from the past 240 hour(s))  Blood culture (routine x 2)     Status:  None   Collection Time: 02/14/16  2:05 PM  Result Value Ref Range Status   Specimen Description BLOOD BLOOD RIGHT FOREARM  Final   Special Requests BOTTLES DRAWN AEROBIC AND ANAEROBIC 10CC  Final   Culture   Final    NO GROWTH 5 DAYS Performed at [facility]    Report Status 02/19/2016 FINAL  Final  Blood culture (routine x 2)     Status: None   Collection Time: 02/14/16  4:24 PM  Result Value Ref Range Status   Specimen Description BLOOD RIGHT [facility]  Final   Special Requests BOTTLES DRAWN AEROBIC AND ANAEROBIC 5CC  Final   Culture   Final  NO GROWTH 5 DAYS Performed at [facility]    Report Status 02/19/2016 FINAL  Final  MRSA PCR Screening     Status: None   Collection Time: 02/14/16 10:48 PM  Result Value Ref Range Status   MRSA by PCR NEGATIVE NEGATIVE Final    Comment:        The GeneXpert MRSA Assay (FDA approved for NASAL specimens only), is one component of a comprehensive MRSA colonization surveillance program. It is not intended to diagnose MRSA infection nor to guide or monitor treatment for MRSA infections.          Radiology Studies: No results found.      Scheduled Meds: . ALPRAZolam  0.5 mg Oral QHS  . budesonide (PULMICORT) nebulizer solution  0.5 mg Nebulization BID  . citalopram  20 mg Oral Daily  . enoxaparin (LOVENOX) injection  40 mg Subcutaneous Q24H  . guaiFENesin  1,200 mg Oral BID  . ipratropium-albuterol  3 mL Nebulization Q6H  . morphine  15 mg Oral Q12H  . predniSONE  40 mg Oral Q breakfast  . senna-docusate  2 tablet Oral Daily   Continuous Infusions: . sodium chloride 10 mL/hr at 02/19/16 1300     LOS: 5 days    Time spent: 15 minutes    Kelvin Cellar, MD [facility] Pager [phone]  If 7PM-7AM, please contact night-coverage [email or web address] Password [email or web address] 02/19/2016, 3:41 PM

## 2016-02-19 NOTE — Care Management Important Message (Signed)
Important Message  Patient Details  Name: KAEDE EISNER MRN: [id number] Date of Birth: [birth year]   Medicare Important Message Given:  Yes    Camillo Flaming 02/19/2016, 9:53 AMImportant Message  Patient Details  Name: MARQUEE WAVRA MRN: [id number] Date of Birth: [birth year]   Medicare Important Message Given:  Yes    Camillo Flaming 02/19/2016, 9:53 AM

## 2016-02-20 MED ORDER — BENZONATATE 200 MG PO CAPS: 200.0000 mg | ORAL_CAPSULE | Freq: Three times a day (TID) | ORAL | 0 refills | Status: AC | PRN

## 2016-02-20 MED ORDER — PREDNISONE 10 MG (21) PO TBPK: ORAL_TABLET | ORAL | 0 refills | Status: DC

## 2016-02-20 MED ORDER — HYDROCOD POLST-CPM POLST ER 10-8 MG/5ML PO SUER: 5.0000 mL | Freq: Two times a day (BID) | ORAL | 0 refills | Status: DC | PRN

## 2016-02-20 MED ORDER — ALPRAZOLAM 0.5 MG PO TABS: 0.5000 mg | ORAL_TABLET | Freq: Every day | ORAL | 0 refills | Status: AC

## 2016-02-20 MED ORDER — MORPHINE SULFATE ER 15 MG PO TBCR: 15.0000 mg | EXTENDED_RELEASE_TABLET | Freq: Two times a day (BID) | ORAL | 0 refills | Status: DC

## 2016-02-20 NOTE — Discharge Summary (Signed)
Physician Discharge Summary  Erol Flanagin Kucher XBJ:[id number] DOB: [birth year] DOA: 02/14/2016  PCP: Kandice Hams, MD  Admit date: 02/14/2016 Discharge date: 02/20/2016  Time spent: 35 minutes  Recommendations for Outpatient Follow-up:  1. [name] was [facility]   Discharge Diagnoses:  Principal Problem:   COPD exacerbation [facility]) Active Problems:   Acute on chronic respiratory failure (HCC)   COPD GOLD IV/ 02 dep   Protein-calorie malnutrition ([place])   Multifocal atrial tachycardia (HCC)   Constipation   Abdominal pain, diffuse   Respiratory distress   Discharge Condition: Stable for Transport  Diet recommendation: As tolerated  Filed Weights   02/14/16 2250 02/16/16 0500 02/17/16 0500  Weight: 86.7 kg (191 lb 2.2 oz) 91.2 kg (201 lb 1 oz) 88.1 kg (194 lb 3.2 oz)    History of present illness:  Dalton. [name] is an 82 year old with very severe COPD, on home O2, 3L, lung nodule, mild systolic heart failure, multifocal atrial tachycardia. He is a patient of Dr. Melvyn Novas. Has been followed for pulmonary nodule that have remained stable on follow-up CT imaging.  He has history of chronic constipation. He complains of severe constipation for the past 3 days. Over the past 24 hours he's had progressive abdominal pain, distention, dyspnea. He also reports near syncope while standing up from chair. He did not lose any consciousness or fall to the ground but has some skin tears over the arm. In the ED got a CT scan of the abdomen which showed large amount of rectal stool with mild perirectal edema. Initial lactic acid was 2.54 which increased to 4.67 and then improved slightly to 4.3. He has received 30cc/kg IVF bolus. Gentle manual disimpaction performed at bedside and glycerin suppository placed. Surgery is aware of the patient. PCCM called for further evaluation.   Hospital Course:  Dalton [name] is an 82 year old gentleman with a history of severe chronic obstructive  pulmonary disease, at baseline uses 4-5 L of supplemental oxygen via nasal cannula, admitted to the pulmonary critical care medicine service on 02/14/2016 when he presented with complaints of shortness of breath. It was felt that severe constipation contributing to shortness of breath, undergoing manual disimpaction.   1.  Chronic active pulmonary disease exacerbation -Patient having a history of probable end-stage COPD, oxygen dependent requiring 4-5 L of supplemental oxygen, having a FEV1 of 23% in 2015 -Dalton Voland approaching end-stage disease, he was transitioned to comfort care  2.  Severe constipation. -CT imaging showing rectal distention due to large amount of stool -He underwent rectal disimpaction -Narcotic analgesics likely contributors, was treated with Relistor during this hospitalization  3.  Acute on chronic respiratory failure -Patient with probable end-stage COPD, having worsening shortness of breath requiring Venturi mask today -Pulmonary critical care medicine consulted, continue supportive care -On 02/17/2016: Reviewed ins and outs, has positive fluid balance of 3 L, will provide 40 mg of IV Lasix now as fluid overload may be a contributor. He received IV fluids earlier during this hospitalization -On 02/18/2016 patient seems improved from a respiratory standpoint, he appears more comfortable -On 02/20/2016 stable for discharge to residential hospice  4.  Medical goals of care -Family members expressing concern for deteriorating clinical status. We discussed the advanced stage of his COPD and what quality-of-life means to him.They would like to focus his care on comfort and request a palliative care consultation. Family members and patient met with Dr. Hilma Favors of palliative medicine to discuss medical goals of care. They decided to continue  receiving care in the residential hospice setting. He was accepted to [facility] on 02/20/2016.    Consultations:  Pulmonary  critical care medicine  Palliative medicine  Discharge Exam: Vitals:   02/19/16 2114 02/20/16 0513  BP: (!) 151/91 (!) 166/104  Pulse: (!) 107 (!) 102  Resp: 17 18  Temp: 97.5 F (36.4 C) 98.3 F (36.8 C)    General exam: Chronically ill-appearing,On nasal cannula now Respiratory system: There is diminished breath sounds bilaterally.  Has coarse respiratory sounds, on nasal cannula Cardiovascular system: Tachycardic, irregular rate and rhythm Gastrointestinal system: Abdomen is nondistended, soft and nontender. No organomegaly or masses felt. Normal bowel sounds heard. Central nervous system: Alert and oriented. No focal neurological deficits. Extremities: Symmetric 5 x 5 power. Skin: No rashes, lesions or ulcers Psychiatry:  mildly confused  Discharge Instructions   Discharge Instructions    Call MD for:  severe uncontrolled pain    Complete by:  As directed   Diet - low sodium heart healthy    Complete by:  As directed   Increase activity slowly    Complete by:  As directed     Current Discharge Medication List    START taking these medications   Details  benzonatate (TESSALON) 200 MG capsule Take 1 capsule (200 mg total) by mouth 3 (three) times daily as needed for cough. Qty: 20 capsule, Refills: 0    chlorpheniramine-HYDROcodone (TUSSIONEX) 10-8 MG/5ML SUER Take 5 mLs by mouth every 12 (twelve) hours as needed for cough. Qty: 140 mL, Refills: 0    morphine (MS CONTIN) 15 MG 12 hr tablet Take 1 tablet (15 mg total) by mouth every 12 (twelve) hours. Qty: 15 tablet, Refills: 0    predniSONE (STERAPRED UNI-PAK 21 TAB) 10 MG (21) TBPK tablet Take 4-3-2-1 tablets by mouth daily till gone. Qty Sufficient Qty: 15 tablet, Refills: 0      CONTINUE these medications which have CHANGED   Details  ALPRAZolam (XANAX) 0.5 MG tablet Take 1 tablet (0.5 mg total) by mouth at bedtime. Qty: 20 tablet, Refills: 0      CONTINUE these medications which have NOT CHANGED    Details  albuterol (PROVENTIL) (2.5 MG/3ML) 0.083% nebulizer solution Take 2.5 mg by nebulization every 6 (six) hours as needed for wheezing or shortness of breath.    budesonide-formoterol (SYMBICORT) 160-4.5 MCG/ACT inhaler Inhale 2 puffs into the lungs 2 (two) times daily.    citalopram (CELEXA) 20 MG tablet Take 20 mg by mouth daily.      STOP taking these medications     Albuterol Sulfate (PROAIR RESPICLICK) 108 (90 Base) MCG/ACT AEPB      oxyCODONE-acetaminophen (PERCOCET/ROXICET) 5-325 MG tablet        No Known Allergies    The results of significant diagnostics from this hospitalization (including imaging, microbiology, ancillary and laboratory) are listed below for reference.    Significant Diagnostic Studies: Dg Chest Port 1 View  Result Date: 02/16/2016 CLINICAL DATA:  Sob, cough, hx of copd EXAM: PORTABLE CHEST 1 VIEW COMPARISON:  02/14/2016 FINDINGS: Apical lordotic positioning. The heart is enlarged. Perihilar bronchitic changes are again noted. Emphysematous changes are present. No new consolidations or definite pleural effusions identified. IMPRESSION: No evidence for acute abnormality. Bronchitic changes and emphysema. Electronically Signed   By: Norva Pavlov M.D.   On: 02/16/2016 10:45   Dg Chest Port 1 View  Result Date: 02/14/2016 CLINICAL DATA:  Shortness of breath today. EXAM: PORTABLE CHEST 1 VIEW COMPARISON:  Most  recent chest imaging chest CT 05/13/2015. Most recent radiograph 05/09/2014 FINDINGS: Advanced emphysema again seen. Heart size and mediastinal contours are unchanged from prior radiograph allowing for differences in technique. There is atherosclerosis of the aortic arch. Chronic bronchial thickening with mild increase from prior with associated increase in interstitial markings diffusely. Streaky lower lobe opacities appear similar and likely scarring. Chronic blunting of the right costophrenic angle. No pneumothorax. Chronic change about the right  ribs. IMPRESSION: Advanced emphysema with chronic bronchial thickening, however increased from prior exam. This may reflect acute on chronic bronchitis. Electronically Signed   By: Jeb Levering M.D.   On: 02/14/2016 18:17  Ct Angio Abd/pel W/ And/or W/o  Result Date: 02/14/2016 CLINICAL DATA:  82 year old with severe shortness of breath and diffuse abdominal pain. Lack of bowel movements. EXAM: CT ANGIOGRAPHY ABDOMEN AND PELVIS TECHNIQUE: Multidetector CT imaging of the abdomen and pelvis was performed using the standard protocol during bolus administration of intravenous contrast. Multiplanar reconstructed images including MIPs were obtained and reviewed to evaluate the vascular anatomy. CONTRAST:  100 mL Isovue 370 COMPARISON:  09/14/2015 FINDINGS: ARTERIAL FINDINGS: Aorta: Atherosclerotic disease in the abdominal aorta without aneurysm or dissection. Celiac axis: Common trunk of the SMA and celiac trunk. Mild plaque in the common trunk without significant stenosis. The common hepatic artery and splenic artery are patent. Superior mesenteric: Superior mesenteric artery main branches are patent. Left renal: Single left renal artery is widely patent with mild atherosclerotic disease. Right renal: There are 2 right renal arteries. The main right renal artery has focal noncalcified plaque 1 cm from the origin causing at least 75% narrowing of the lumen. The inferior accessory right renal artery appears to be patent. Inferior mesenteric: Inferior mesenteric artery is patent. Left iliac: Atherosclerotic plaque and ectasia in the left common iliac artery, measuring up to 1.9 cm. Ectasia of the proximal left internal iliac artery measuring up to 1.7 cm. Left external iliac artery is patent. Proximal left femoral arteries are patent. Right iliac: Irregular atherosclerotic plaque in the proximal right common iliac artery. There is a focal aneurysm of the distal right common iliac artery measuring up to 2.6 cm and  previously measured 2.4 cm. Diffuse atherosclerotic disease in the right internal iliac artery. Right external iliac artery is patent. Proximal right femoral arteries are patent. There may be a small amount of plaque in the proximal right SFA distally, partially visualized. Venous findings: The left common iliac vein is compressed by the right common iliac artery which is a normal anatomic configuration and no evidence for a DVT. IVC and renal veins are patent. Limited evaluation of the portal venous system due to motion artifact. Review of the MIP images confirms the above findings. NONVASCULAR FINDINGS: Lung bases demonstrate diffuse emphysematous changes. No pleural effusions. Multiple gallstones without evidence for gallbladder inflammation. Normal appearance of the liver. No biliary dilatation. Normal appearance of the pancreas without inflammation or duct dilatation. Normal appearance of spleen without enlargement. Normal appearance of the adrenal glands. Again noted is a large stone in left renal pelvis measuring up to 1.3 cm without significant hydronephrosis. Evidence for cortical and parapelvic left renal cysts. Again noted is a 0.7 cm stone in the right kidney lower pole. Again noted is a exophytic right renal cyst. No significant right hydronephrosis. Normal appearance of the urinary bladder. Rectal distension due to stool. There is mild perirectal and presacral edema. Normal appearance of stomach and small bowel. No evidence for bowel obstruction. No suspicious lymphadenopathy in the  abdomen or pelvis. No significant free fluid. No free air. No acute bone abnormality. No gross abnormality to the prostate or seminal vesicles. IMPRESSION: Rectal distension due to a large amount of stool with mild perirectal and presacral edema. Findings are suggestive for constipation. Atherosclerotic disease involving the aorta, iliac arteries and visceral arteries. No significant stenosis in the mesenteric arteries with  a common trunk for the celiac trunk and SMA. Stenosis in the main right renal artery due to focal noncalcified plaque. 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 Bilateral nephrolithiasis without hydronephrosis. Cholelithiasis. Bilateral renal cysts. Electronically Signed   By: Markus Daft M.D.   On: 02/14/2016 15:30   Microbiology: Recent Results (from the past 240 hour(s))  Blood culture (routine x 2)     Status: None   Collection Time: 02/14/16  2:05 PM  Result Value Ref Range Status   Specimen Description BLOOD BLOOD RIGHT FOREARM  Final   Special Requests BOTTLES DRAWN AEROBIC AND ANAEROBIC 10CC  Final   Culture   Final    NO GROWTH 5 DAYS Performed at [facility]    Report Status 02/19/2016 FINAL  Final  Blood culture (routine x 2)     Status: None   Collection Time: 02/14/16  4:24 PM  Result Value Ref Range Status   Specimen Description BLOOD RIGHT [facility]  Final   Special Requests BOTTLES DRAWN AEROBIC AND ANAEROBIC 5CC  Final   Culture   Final    NO GROWTH 5 DAYS Performed at [facility]    Report Status 02/19/2016 FINAL  Final  MRSA PCR Screening     Status: None   Collection Time: 02/14/16 10:48 PM  Result Value Ref Range Status   MRSA by PCR NEGATIVE NEGATIVE Final    Comment:        The GeneXpert MRSA Assay (FDA approved for NASAL specimens only), is one component of a comprehensive MRSA colonization surveillance program. It is not intended to diagnose MRSA infection nor to guide or monitor treatment for MRSA infections.      Labs: Basic Metabolic Panel:  Recent Labs Lab 02/14/16 1404 02/14/16 1408 02/15/16 0410 02/16/16 0513 02/16/16 1052  NA 141 141 141 142 142  K 3.9 3.9 4.2 4.6 4.3  CL 106 105 114* 112* 112*  CO2 25  --  20* 22 21*  GLUCOSE 111* 111* 130* 88 77  BUN 27* 29* 32* 41* 40*  CREATININE 1.27* 1.20 1.51* 1.43* 1.33*  CALCIUM 9.3  --  8.2* 8.6* 8.5*  MG  --   --  1.9 2.3  --   PHOS  --   --  2.3*  3.2  --    Liver Function Tests:  Recent Labs Lab 02/14/16 1404  AST 26  ALT 18  ALKPHOS 81  BILITOT 1.4*  PROT 7.0  ALBUMIN 4.2   No results for input(s): LIPASE, AMYLASE in the last 168 hours. No results for input(s): AMMONIA in the last 168 hours. CBC:  Recent Labs Lab 02/14/16 1404 02/14/16 1408 02/15/16 0410 02/16/16 0513 02/16/16 1052  WBC 17.9*  --  21.6* 21.0* 21.8*  NEUTROABS  --   --   --   --  18.6*  HGB 16.3 17.3* 14.6 15.0 15.2  HCT 50.1 51.0 45.6 47.8 48.1  MCV 94.5  --  95.8 96.6 96.8  PLT 152  --  145* 151 170   Cardiac Enzymes:  Recent Labs Lab 02/15/16 0410 02/15/16  0701 02/15/16 1306  TROPONINI 0.10* 0.11* 0.14*   BNP: BNP (last 3 results) No results for input(s): BNP in the last 8760 hours.  ProBNP (last 3 results) No results for input(s): PROBNP in the last 8760 hours.  CBG: No results for input(s): GLUCAP in the last 168 hours.     Signed:  Kelvin Cellar MD.  [facility] 02/20/2016, 10:32 AM

## 2016-02-20 NOTE — Clinical Social Work Note (Signed)
Patient discharged to [facility] today, transported by ambulance. Family at the bedside and are aware of today's discharge and ambulance transport.  Guiseppe Flanagan Givens, MSW, LCSW Licensed Clinical Social Worker [place] [phone]

## 2016-04-07 ENCOUNTER — Other Ambulatory Visit: Payer: Self-pay | Admitting: Cardiothoracic Surgery

## 2016-04-07 DIAGNOSIS — R918 Other nonspecific abnormal finding of lung field: Secondary | ICD-10-CM

## 2016-04-28 ENCOUNTER — Inpatient Hospital Stay: Admission: RE | Admit: 2016-04-28 | Payer: Commercial Managed Care - HMO | Source: Ambulatory Visit

## 2016-04-28 ENCOUNTER — Ambulatory Visit: Payer: Commercial Managed Care - HMO | Admitting: Cardiothoracic Surgery

## 2016-04-28 NOTE — Progress Notes (Deleted)
[place] Record [id number] Date of Birth: 1933/11/01  Referring: Seward Carol, MD Primary Care: Kandice Hams, MD  Chief Complaint: left  lung nodule  History of Present Illness:    [name] 82 y.o. male is seen in the office  today for severe COPD oxygen dependent an incidental finding of a less than 1 cm left lung nodule  . The patient was originally seen approximately two   year ago .The patient's had at least 10 year history of severe COPD oxygen dependent, very limited  Functionally due to shortness of breath. He fell in October 2014 and chest x-ray in the emergency room suggested a left lung lesion, CT scan and PET scan were performed and the patient was referred to the [facility] clinic for further evaluation. Followup CT scans were done in the spring  And fall of 2015 and her patient returns today with a followup CT scan.   He continues to be very limited due to his pulmonary reserve, on home oxygen.  Current Activity/ Functional Status:  Patient is independent with mobility/ambulation, transfers, ADL's, IADL's.  Zubrod Score: At the time of surgery this patient's most appropriate activity status/level should be described as: []  Normal activity, no symptoms []  Symptoms, fully ambulatory [x]  Symptoms, in bed less than or equal to 50% of the time []  Symptoms, in bed greater than 50% of the time but less than 100% []  Bedridden []  Moribund   Past Medical History:  Diagnosis Date  . Anxiety   . Asthma   . COPD (chronic obstructive pulmonary disease) ([place])   . Depression   . Emphysema of lung ([place])   . Hypertension   . Multiple lung nodules   . On home O2    3L N/C continuous  . Oxygen deficiency   . Shortness of breath dyspnea     Past Surgical History:  Procedure Laterality Date  . HERNIA REPAIR  2008  . lump removed from neck  2004    No family history on file.  Social History   Social History    . Marital status: Married    Spouse name: N/A  . Number of children: N/A  . Years of education: N/A   Occupational History  . Not on file.   Social History Main Topics  . Smoking status: Former Smoker    Packs/day: 1.50    Years: 50.00    Types: Cigarettes    Quit date: 09/06/2002  . Smokeless tobacco: Never Used  . Alcohol use No  . Drug use: No  . Sexual activity: No   Other Topics Concern  . Not on file   Social History Narrative  . No narrative on file    History  Smoking Status  . Former Smoker  . Packs/day: 1.50  . Years: 50.00  . Types: Cigarettes  . Quit date: 09/06/2002  Smokeless Tobacco  . Never Used    History  Alcohol Use No     No Known Allergies  Current Outpatient Prescriptions  Medication Sig Dispense Refill  . albuterol (PROVENTIL) (2.5 MG/3ML) 0.083% nebulizer solution Take 2.5 mg by nebulization every 6 (six) hours as needed for wheezing or shortness of breath.    . ALPRAZolam (XANAX) 0.5 MG tablet Take 1 tablet (0.5 mg total) by mouth at bedtime. 20 tablet 0  . benzonatate (  TESSALON) 200 MG capsule Take 1 capsule (200 mg total) by mouth 3 (three) times daily as needed for cough. 20 capsule 0  . budesonide-formoterol (SYMBICORT) 160-4.5 MCG/ACT inhaler Inhale 2 puffs into the lungs 2 (two) times daily.    . chlorpheniramine-HYDROcodone (TUSSIONEX) 10-8 MG/5ML SUER Take 5 mLs by mouth every 12 (twelve) hours as needed for cough. 140 mL 0  . citalopram (CELEXA) 20 MG tablet Take 20 mg by mouth daily.    Marland Kitchen morphine (MS CONTIN) 15 MG 12 hr tablet Take 1 tablet (15 mg total) by mouth every 12 (twelve) hours. 15 tablet 0  . predniSONE (STERAPRED UNI-PAK 21 TAB) 10 MG (21) TBPK tablet Take 4-3-2-1 tablets by mouth daily till gone. Qty Sufficient 15 tablet 0   No current facility-administered medications for this visit.        Review of Systems:     Cardiac Review of Systems: Y or N  Chest Pain [  n  ]  Resting SOB Blue.Reese   ] Exertional SOB  [ y  ]  Vertell Limber Florencio.Farrier  ]   Pedal Edema [  n ]    Palpitations [ n ] Syncope  [ n ]   Presyncope [ n  ]  General Review of Systems: [Y] = yes [  ]=no Constitional: recent weight change [  ];  Wt loss over the last 3 months [n   ] anorexia [  ]; fatigue [  ]; nausea [  ]; night sweats [  ]; fever [ n]; or chills [n  ];          Dental: poor dentition[  ]; Last Dentist visit:   Eye : blurred vision [  ]; diplopia [   ]; vision changes [  ];  Amaurosis fugax[  ]; Resp: cough Blue.Reese  ];  wheezing[y  ];  hemoptysis[n  ]; shortness of breath[ y ]; paroxysmal nocturnal dyspnea[ y ]; dyspnea on exertion[y  ]; or orthopnea[  ];  GI:  Gallstones[known on ct  ], vomiting[  ];  dysphagia[  ]; melena[  ];  hematochezia [  ]; heartburn[  ];   Hx of  Colonoscopy[  ]; GU: kidney stones [  ]; hematuria[  ];   dysuria [  ];  nocturia[  ];  history of     obstruction [  ]; urinary frequency [  ]             Skin: rash, swelling[  ];, hair loss[  ];  peripheral edema[  ];  or itching[  ]; Musculosketetal: myalgias[  ];  joint swelling[  ];  joint erythema[  ];  joint pain[  ];  back pain[  ];  Heme/Lymph: bruising[  ];  bleeding[  ];  anemia[  ];  Neuro: TIA[  ];  headaches[  ];  stroke[  ];  vertigo[  ];  seizures[  ];   paresthesias[  ];  difficulty walking[ sob ];  Psych:depression[  ]; anxiety[  ];  Endocrine: diabetes[  ];  thyroid dysfunction[  ];  Immunizations: Flu up to date [not this year  ]; Pneumococcal up to date [ y ];  Other:  Physical Exam: There were no vitals taken for this visit.  Patient remains on oxygen 3 L when at rest and turned to 5 with exertion  General appearance: alert, cooperative, appears older than stated age, fatigued and mild distress Neurologic: intact Heart: regular rate and rhythm, S1, S2 normal, no murmur,  click, rub or gallop Lungs: diminished breath sounds bilaterally Abdomen: soft, non-tender; bowel sounds normal; no masses,  no organomegaly Extremities: extremities normal,  atraumatic, no cyanosis or edema and Homans sign is negative, no sign of DVT Patient has no carotid bruits Has no cervical or supraclavicular adenopathy Patient has easy bruisability of his arms  Diagnostic Studies & Laboratory data:     Recent Radiology Findings:  Ct Chest Wo Contrast  05/13/2015  CLINICAL DATA:  Followup lung nodules EXAM: CT CHEST WITHOUT CONTRAST TECHNIQUE: Multidetector CT imaging of the chest was performed following the standard protocol without IV contrast. COMPARISON:  06/10/2014 FINDINGS: Mediastinum: Heart size is normal. No pericardial effusion identified. Aortic atherosclerosis noted. Similar appearance of lipomatosis hypertrophy of the interatrial septum, image 42 of series 3. No mediastinal or hilar adenopathy. The trachea appears normal and patent. Normal appearance of the esophagus. No enlarged supraclavicular or axillary lymph nodes. Lungs/Pleura: There is no pleural fluid identified. Moderate to advanced changes of centrilobular and paraseptal emphysema identified. Calcified granulomas noted in the left upper lobe, image 32 of series 4. There is an adjacent noncalcified nodule which is unchanged measuring 5 mm, image 28 of series 4. This nodule has been stable since 05/22/2013. Twenty-four months of stability are compatible with benign process. No new or enlarging suspicious nodules or masses identified. Upper Abdomen: No focal liver abnormality. Multiple stones identified within the gallbladder. The adrenal glands appear normal. Cyst is noted arising from the upper pole of right kidney. Musculoskeletal: No aggressive lytic or sclerotic bone lesion identified. The bones appear diffusely osteopenic and there is a marked kyphosis deformity involving the upper thoracic spine. IMPRESSION: 1. No acute findings. 2. Stable left upper lobe pulmonary nodule compatible with a benign process. 3. Emphysema 4. Aortic atherosclerosis 5. Thoracic kyphosis. Electronically Signed   By:  Kerby Moors M.D.   On: 05/13/2015 14:48   Ct Chest Wo Contrast  04/17/2014   CLINICAL DATA:  Followup indeterminate pulmonary nodules. COPD. Shortness of breath. Former smoker.  EXAM: CT CHEST WITHOUT CONTRAST  TECHNIQUE: Multidetector CT imaging of the chest was performed following the standard protocol without IV contrast.  COMPARISON:  09/12/2013 and 05/22/2013  FINDINGS: Mediastinum/Hilar Regions: No masses or pathologically enlarged lymph nodes identified.  Other Thoracic Lymphadenopathy:  None.  Lungs: Severe emphysema again noted. Multiple sub-cm left lung nodules remains stable, largest within the anterior left upper lobe measuring 7 mm on image 28. No new or enlarging pulmonary nodules or masses are identified.  Pleura:  No evidence of effusion or mass.  Vascular/Cardiac: No thoracic aortic aneurysm or other significant abnormality identified.  Musculoskeletal:  No suspicious bone lesions identified.  Other:  Tiny nonobstructive right renal calculus incidentally noted.  IMPRESSION: Stable sub-cm left lung nodules.  Stable severe COPD.  No acute findings.   Electronically Signed   By: Earle Gell M.D.   On: 04/17/2014 11:27   Dg Chest 2 View (if Patient Has Fever And/or Copd)  09/12/2013   CLINICAL DATA:  Shortness of breath and cough.  EXAM: CHEST  2 VIEW  COMPARISON:  NM PET IMAGE INITIAL (PI) SKULL BASE TO THIGH dated 06/04/2013; CT CHEST W/O CM dated 05/22/2013; DG CHEST 2 VIEW dated 05/03/2013  FINDINGS: Mediastinum and hilar structures are normal. Severe interstitial prominence and bullous change noted consistent pulmonary interstitial fibrosis and COPD. Previously identified left mid lung zone nodule is not well identified on today's examination, reference made to a prior reports. No pneumothorax. No acute bony  abnormality .  IMPRESSION: 1. 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 2. Previously identified nodule in the left mid lung field is not well identified on today's  examination. This may be secondary to projection. Reference is made to prior reports. If need be for further evaluation repeat CT or PET-CT can be obtained.   Electronically Signed   By: Marcello Moores  Register   On: 09/12/2013 10:03   Ct Angio Chest Pe W/cm &/or Wo Cm  09/12/2013   CLINICAL DATA:  Shortness of breath, question pulmonary embolism, history emphysema, asthma, COPD, hypertension, former smoker  EXAM: CT ANGIOGRAPHY CHEST WITH CONTRAST  TECHNIQUE: Multidetector CT imaging of the chest was performed using the standard protocol during bolus administration of intravenous contrast. Multiplanar CT image reconstructions and MIPs were obtained to evaluate the vascular anatomy.  CONTRAST:  167mL OMNIPAQUE IOHEXOL 350 MG/ML SOLN  COMPARISON:  05/22/2013  FINDINGS: Atherosclerotic calcifications aorta without aneurysm or dissection.  Pulmonary arteries well opacified with scattered respiratory motion artifacts at the lower lobes.  No definite evidence of pulmonary embolism.  Visualized upper abdomen unremarkable.  No thoracic adenopathy.  Severe emphysematous changes with minimal atelectasis in the right middle and right lower lobes.  Minimal interstitial changes at the right base.  Small focus of atelectasis or scarring in left lower lobe image 46.  4 mm diameter lung nodules left apex image 12 and lingula image 35 unchanged.  5 mm lingular nodule image 44 stable.  No acute infiltrate, pleural effusion or pneumothorax.  Bones diffusely demineralized.  Review of the MIP images confirms the above findings.  IMPRESSION: Severe COPD changes with scattered scarring and stable left lung nodules.  No evidence of pulmonary embolism or definite acute intra thoracic process.   Electronically Signed   By: Lavonia Dana M.D.   On: 09/12/2013 13:55    Ct Chest Wo Contrast  05/22/2013   CLINICAL DATA:  New pulmonary nodule seen on chest x-ray of 05/03/2013. Shortness of breath. COPD.  EXAM: CT CHEST WITHOUT CONTRAST  TECHNIQUE:  Multidetector CT imaging of the chest was performed following the standard protocol without IV contrast.  COMPARISON:  Chest x-ray dated 05/03/2013 and chest CT dated 04/18/2010  FINDINGS: There is a new spiculated lobulated 16 x 13 x 9 mm nodule in the region of the lingula of the left upper lobe. The finding is consistent with a carcinoma of the lung.  The finding is superimposed on severe emphysema. There are several areas of new oblong nodularity in the left upper lobe which I suspect represent intrapulmonary lymph nodes in areas of new parenchymal scarring.  There is no hilar or mediastinal adenopathy. Heart size is normal. No acute osseous abnormality. The thoracic kyphosis is markedly accentuated.  Scans of the upper abdomen demonstrate multiple gallstones as well as a 11 mm stone in the left renal pelvis. These were present on the prior CT scan abdomen dated 08/28/2012. Minimal dilatation of the left renal collecting system.  IMPRESSION: New spiculated 16 mm mass in the left upper lobe consistent with a carcinoma of the lung.   Electronically Signed   By: Rozetta Nunnery M.D.   On: 05/22/2013 16:32   Nm Pet Image Initial (pi) Skull Base To Thigh  06/04/2013   CLINICAL DATA:  Initial Treatment strategy for left upper lobe lung lesion. Marland Kitchen  EXAM: NUCLEAR MEDICINE PET SKULL BASE TO THIGH  FASTING BLOOD GLUCOSE:  Value: 92mg /dl  TECHNIQUE: 17.2 mCi F-18 FDG was injected intravenously. CT data  was obtained and used for attenuation correction and anatomic localization only. (This was not acquired as a diagnostic CT examination.) Additional exam technical data entered on technologist worksheet.  COMPARISON:  Chest CT 05/22/2013  FINDINGS: NECK  No hypermetabolic lymph nodes in the neck.  CHEST  The lingular spiculated lesion demonstrates FDG uptake with SUV max of 3.6. This is consistent with a primary lung neoplasm. There are several other pulmonary nodules but no definite abnormal FDG uptake. No mediastinal or  hilar lymphadenopathy.  ABDOMEN/PELVIS  No abnormal hypermetabolic activity within the liver, pancreas, adrenal glands, or spleen. No hypermetabolic lymph nodes in the abdomen or pelvis.  SKELETON  No findings to suggest osseous metastatic disease. There is slight increased uptake in a right lower anterior with but this has the appearance of a fracture.  IMPRESSION: Lingular nodule demonstrates neoplastic range FDG uptake with SUV max of 3.6.  No mediastinal or hilar lymphadenopathy and no findings for metastatic disease.  Severe emphysematous changes with pulmonary scarring. No other definite FDG positive pulmonary nodules.   Electronically Signed   By: Kalman Jewels M.D.   On: 06/04/2013 13:12      Recent Lab Findings: Lab Results  Component Value Date   WBC 21.8 (H) 02/16/2016   HGB 15.2 02/16/2016   HCT 48.1 02/16/2016   PLT 170 02/16/2016   GLUCOSE 77 02/16/2016   ALT 18 02/14/2016   AST 26 02/14/2016   NA 142 02/16/2016   K 4.3 02/16/2016   CL 112 (H) 02/16/2016   CREATININE 1.33 (H) 02/16/2016   BUN 40 (H) 02/16/2016   CO2 21 (L) 02/16/2016   TSH 0.889 04/17/2010   PFT's 03/2014   FEV1   0 .77 23% DLCO 5.91 16%   Assessment / Plan:  Stable sub-cm left lung nodules. - By CT scan Patient has severely limiting COPD oxygen dependence and would not be  operative candidate and not eligiable for lung cancer screening clinic Severe Obstructive Airways Disease Severe Diffusion Defect  I have reviewed with the patient the findings of his CT scan of the chest done today. With the small size and stability of the nodules in his severe underlying pulmonary disease I would not recommend anything more than followup CT. Will plan followup CT of the chest in one year. encouraged to go  today to Dr Delfina Redwood office and get flu shot.   Grace Isaac MD   [address] [place] [address] Office [phone]   Beeper [id number]  04/28/2016 2:29 PM

## 2016-08-05 ENCOUNTER — Encounter (HOSPITAL_COMMUNITY): Payer: Self-pay | Admitting: Nurse Practitioner

## 2016-08-05 ENCOUNTER — Emergency Department (HOSPITAL_COMMUNITY)

## 2016-08-05 ENCOUNTER — Emergency Department (HOSPITAL_COMMUNITY)
Admission: EM | Admit: 2016-08-05 | Discharge: 2016-08-05 | Disposition: A | Discharge status: Home / Self Care | Attending: Emergency Medicine | Admitting: Emergency Medicine

## 2016-08-05 DIAGNOSIS — S299XXA Unspecified injury of thorax, initial encounter: Secondary | ICD-10-CM | POA: Diagnosis not present

## 2016-08-05 DIAGNOSIS — M25511 Pain in right shoulder: Secondary | ICD-10-CM | POA: Diagnosis not present

## 2016-08-05 DIAGNOSIS — W010XXA Fall on same level from slipping, tripping and stumbling without subsequent striking against object, initial encounter: Secondary | ICD-10-CM | POA: Diagnosis not present

## 2016-08-05 DIAGNOSIS — R000 Tachycardia, unspecified: Secondary | ICD-10-CM | POA: Diagnosis not present

## 2016-08-05 DIAGNOSIS — Z87891 Personal history of nicotine dependence: Secondary | ICD-10-CM | POA: Diagnosis not present

## 2016-08-05 DIAGNOSIS — I110 Hypertensive heart disease with heart failure: Secondary | ICD-10-CM | POA: Diagnosis not present

## 2016-08-05 DIAGNOSIS — I5020 Unspecified systolic (congestive) heart failure: Secondary | ICD-10-CM | POA: Diagnosis not present

## 2016-08-05 DIAGNOSIS — I10 Essential (primary) hypertension: Secondary | ICD-10-CM | POA: Diagnosis not present

## 2016-08-05 DIAGNOSIS — Y999 Unspecified external cause status: Secondary | ICD-10-CM | POA: Diagnosis not present

## 2016-08-05 DIAGNOSIS — Y939 Activity, unspecified: Secondary | ICD-10-CM | POA: Diagnosis not present

## 2016-08-05 DIAGNOSIS — M25519 Pain in unspecified shoulder: Secondary | ICD-10-CM | POA: Diagnosis not present

## 2016-08-05 DIAGNOSIS — J449 Chronic obstructive pulmonary disease, unspecified: Secondary | ICD-10-CM | POA: Diagnosis not present

## 2016-08-05 DIAGNOSIS — Y92009 Unspecified place in unspecified non-institutional (private) residence as the place of occurrence of the external cause: Secondary | ICD-10-CM | POA: Diagnosis not present

## 2016-08-05 DIAGNOSIS — S4991XA Unspecified injury of right shoulder and upper arm, initial encounter: Secondary | ICD-10-CM | POA: Diagnosis present

## 2016-08-05 DIAGNOSIS — S40011A Contusion of right shoulder, initial encounter: Secondary | ICD-10-CM | POA: Diagnosis not present

## 2016-08-05 DIAGNOSIS — W19XXXA Unspecified fall, initial encounter: Secondary | ICD-10-CM

## 2016-08-05 MED ORDER — OXYCODONE-ACETAMINOPHEN 5-325 MG PO TABS
1.0000 | ORAL_TABLET | Freq: Once | ORAL | Status: AC
  Administered 2016-08-05: 1 via ORAL
  Filled 2016-08-05: qty 1

## 2016-08-05 NOTE — ED Provider Notes (Signed)
[facility] DEPT Provider Note   CSN: [id number] Arrival date & time: 08/05/16  0954     History   Chief Complaint Chief Complaint  Patient presents with  . Fall  . Shoulder Pain    HPI [name] is a 83 y.o. male.  HPI  83 year old male presents with a fall while walking with his walker. He is on the way to go to the bathroom and he states that his legs gave out from under him and he lost balance. Thinks he landed on the right shoulder. Did not hit his head or lose consciousness. He is currently in hospice for end-stage COPD. Is on 3-5 liters of oxygen at home. Gets out of breath very easily whenever anything changes per the son but he is not more short of breath than typical at this time. Pain is at the right shoulder and medial to the right shoulder over his right chest. Did not feel dizzy or lightheaded. Usually takes 5 mg Percocet 4 times per day but missed the morning dose. He has significant trouble ambulating at baseline.  Past Medical History:  Diagnosis Date  . Anxiety   . Asthma   . COPD (chronic obstructive pulmonary disease) ([place])   . Depression   . Emphysema of lung ([place])   . Hypertension   . Multiple lung nodules   . On home O2    3L N/C continuous  . Oxygen deficiency   . Shortness of breath dyspnea     Patient Active Problem List   Diagnosis Date Noted  . Respiratory distress   . Abdominal pain, diffuse   . Constipation 02/14/2016  . Chronic respiratory failure with hypoxia ([place]) 06/13/2014  . Protein-calorie malnutrition ([place]) 06/13/2014  . Multifocal atrial tachycardia ([place]) 06/13/2014  . Systolic CHF with reduced left ventricular function, NYHA class 2 ([place]) 06/13/2014  . Bradycardia   . COPD with acute exacerbation ([place]) 06/09/2014  . Atrial tachycardia, multifocal ([place]) 06/09/2014  . COPD exacerbation ([place]) 09/12/2013  . Acute on chronic respiratory failure ([place]) 09/12/2013  . COPD GOLD IV/ 02 dep 06/06/2013  . O2 dependent  06/06/2013  . Renal stone 06/06/2013  . Abnormal chest x-ray with multiple lung nodules 06/06/2013  . Cholelithiasis 09/06/2012    Past Surgical History:  Procedure Laterality Date  . HERNIA REPAIR  2008  . lump removed from neck  2004       Home Medications    Prior to Admission medications   Medication Sig Start Date End Date Taking? Authorizing Provider  albuterol (PROVENTIL) (2.5 MG/3ML) 0.083% nebulizer solution Take 2.5 mg by nebulization 2 (two) times daily.    Yes Historical Provider, MD  ALPRAZolam Duanne Moron) 0.5 MG tablet Take 1 tablet (0.5 mg total) by mouth at bedtime. 02/20/16  Yes Kelvin Cellar, MD  alum & mag hydroxide-simeth (MAALOX/MYLANTA) 200-200-20 MG/5ML suspension Take 15 mLs by mouth every 6 (six) hours as needed for indigestion or heartburn.   Yes Historical Provider, MD  benzonatate (TESSALON) 200 MG capsule Take 1 capsule (200 mg total) by mouth 3 (three) times daily as needed for cough. 02/20/16  Yes Kelvin Cellar, MD  budesonide-formoterol [facility]) 160-4.5 MCG/ACT inhaler Inhale 2 puffs into the lungs 2 (two) times daily.   Yes Historical Provider, MD  chlorpheniramine-HYDROcodone (TUSSIONEX) 10-8 MG/5ML SUER Take 5 mLs by mouth every 12 (twelve) hours as needed for cough. 02/20/16  Yes Kelvin Cellar, MD  citalopram (CELEXA) 20 MG tablet Take 20 mg by mouth daily.  Yes Historical Provider, MD  [employer] SENNA PLUS 8.6-50 MG tablet Take 2 tablets by mouth at bedtime. 07/08/16  Yes Historical Provider, MD  guaifenesin (HUMIBID E) 400 MG TABS tablet Take 400 mg by mouth daily.   Yes Historical Provider, MD  ipratropium (ATROVENT) 0.02 % nebulizer solution Take 0.5 mg by nebulization 2 (two) times daily.   Yes Historical Provider, MD  LORazepam (ATIVAN) 0.5 MG tablet Take 0.5 mg by mouth See admin instructions. Take 0.5 mg by mouth every 4 hours as needed and each night at bedtime 07/08/16  Yes Historical Provider, MD  morphine (MS CONTIN) 15 MG 12 hr tablet Take 1  tablet (15 mg total) by mouth every 12 (twelve) hours. Patient taking differently: Take 15 mg by mouth every 12 (twelve) hours as needed for pain.  02/20/16  Yes Kelvin Cellar, MD  omeprazole (PRILOSEC) 40 MG capsule Take 40 mg by mouth daily. 07/09/16  Yes Historical Provider, MD  ondansetron (ZOFRAN) 4 MG tablet Take 4 mg by mouth every 8 (eight) hours as needed for nausea or vomiting.   Yes Historical Provider, MD  oxyCODONE-acetaminophen (PERCOCET/ROXICET) 5-325 MG tablet Take 1 tablet by mouth 4 (four) times daily.   Yes Historical Provider, MD    Family History History reviewed. No pertinent family history.  Social History Social History  Substance Use Topics  . Smoking status: Former Smoker    Packs/day: 1.50    Years: 50.00    Types: Cigarettes    Quit date: 09/06/2002  . Smokeless tobacco: Never Used  . Alcohol use No     Allergies   Patient has no known allergies.   Review of Systems Review of Systems  Respiratory: Positive for shortness of breath (chronic).   Cardiovascular: Positive for chest pain.  Musculoskeletal: Positive for arthralgias.  Neurological: Negative for dizziness, weakness, light-headedness and numbness.  All other systems reviewed and are negative.    Physical Exam Updated Vital Signs BP 156/72 (BP Location: Left Arm)   Pulse 99   Temp 97.8 F (36.6 C)   Resp 18   Ht 6\' 1"  (1.854 m)   Wt 170 lb (77.1 kg)   SpO2 96%   BMI 22.43 kg/m   Physical Exam  Constitutional: He is oriented to person, place, and time. He appears well-developed and well-nourished.  HENT:  Head: Normocephalic and atraumatic.  Right Ear: External ear normal.  Left Ear: External ear normal.  Nose: Nose normal.  Eyes: Right eye exhibits no discharge. Left eye exhibits no discharge.  Neck: Neck supple. No spinous process tenderness and no muscular tenderness present.  Cardiovascular: Normal rate, regular rhythm and normal heart sounds.   Pulses:      Radial  pulses are 2+ on the right side, and 2+ on the left side.       Dorsalis pedis pulses are 2+ on the right side, and 2+ on the left side.  Pulmonary/Chest: Effort normal and breath sounds normal. Tachypnea noted.  Abdominal: Soft. There is no tenderness.  Musculoskeletal: He exhibits no edema.       Right shoulder: He exhibits tenderness. He exhibits normal range of motion and no deformity.       Right elbow: He exhibits normal range of motion. No tenderness found.       Right hip: He exhibits normal range of motion and no tenderness.       Left hip: He exhibits normal range of motion and no tenderness.  Cervical back: He exhibits no tenderness.       Right upper arm: He exhibits no tenderness.       Arms:      Right upper leg: He exhibits no tenderness.       Left upper leg: He exhibits no tenderness.       Right lower leg: He exhibits no tenderness.       Left lower leg: He exhibits no tenderness.  Neurological: He is alert and oriented to person, place, and time.  Skin: Skin is warm and dry. He is not diaphoretic.  Nursing note and vitals reviewed.    ED Treatments / Results  Labs (all labs ordered are listed, but only abnormal results are displayed) Labs Reviewed - No data to display  EKG  EKG Interpretation None       Radiology Dg Chest 2 View  Result Date: 08/05/2016 CLINICAL DATA:  83 year old male on hospice care status post a fall today. Right shoulder pain. EXAM: CHEST  2 VIEW COMPARISON:  Chest x-ray 02/16/2016. FINDINGS: Lungs are hyperexpanded with emphysematous changes. No consolidative airspace disease. No pleural effusions. No pneumothorax. No pulmonary nodule or mass noted. Pulmonary vasculature and the cardiomediastinal silhouette are within normal limits. Atherosclerosis in the thoracic aorta. IMPRESSION: 1. No radiographic evidence of acute cardiopulmonary disease. 2. Aortic atherosclerosis. 3. Emphysema. Electronically Signed   By: Vinnie Langton M.D.    On: 08/05/2016 12:14   Dg Shoulder Right  Result Date: 08/05/2016 CLINICAL DATA:  [place] with right shoulder pain. EXAM: RIGHT SHOULDER - 2+ VIEW COMPARISON:  04/23/2013 FINDINGS: Right shoulder is located. There is no evidence for an acute fracture. Right AC joint is intact. Chronic irregularity of the right upper rib cage. Emphysematous changes in the right lung. IMPRESSION: No acute abnormality involving the right shoulder. Electronically Signed   By: Markus Daft M.D.   On: 08/05/2016 11:46    Procedures Procedures (including critical care time)  Medications Ordered in ED Medications  oxyCODONE-acetaminophen (PERCOCET/ROXICET) 5-325 MG per tablet 1 tablet (1 tablet Oral Given 08/05/16 1056)  oxyCODONE-acetaminophen (PERCOCET/ROXICET) 5-325 MG per tablet 1 tablet (1 tablet Oral Given 08/05/16 1315)     Initial Impression / Assessment and Plan / ED Course  I have reviewed the triage vital signs and the nursing notes.  Pertinent labs & imaging results that were available during my care of the patient were reviewed by me and considered in my medical decision making (see chart for details).  Clinical Course as of Aug 06 1943  Fri Aug 05, 2016  1029 PO Percocet (home dose), right shoulder and CXR. No headache or head injury. No palpable neck pain. NV intact. Dyspnea at baseline, mild increased WOB at this time  [SG]    Clinical Course User Index [SG] Sherwood Gambler, MD    Xrays unremarkable. Likely contusion. He is freely moving shoulder and NV intact. Advised to keep moving to help prevent frozen shoulder. No other signs of injury. Is dyspneic but this is chronic and not new. D/c home. Has pain meds at home.  Final Clinical Impressions(s) / ED Diagnoses   Final diagnoses:  Contusion of right shoulder, initial encounter  Fall in home, initial encounter    New Prescriptions Discharge Medication List as of 08/05/2016  1:09 PM       Sherwood Gambler, MD 08/05/16 1946

## 2016-08-05 NOTE — ED Notes (Signed)
[facility] called for transportation. Discharge instructions reviewed with pt and pts family at bedside.

## 2016-08-05 NOTE — ED Triage Notes (Signed)
Per EMS, pt from home. Pt on hospice care.  Pt had fall today.  Witnessed and mechanical.  Tripped while going to bathroom.  Fell with his walker. Rt shoulder pain.  No head injury. No LOC.  No thinners.  Hospice for emphysema.  Pt also c/o shortness of breath.  RA sats 90%.  6L per [place] to 94%.  Pt is soiled.  Vitals: 110/72, hr 117, resp 22,

## 2016-08-18 ENCOUNTER — Encounter: Payer: Self-pay | Admitting: Internal Medicine

## 2016-08-18 NOTE — Progress Notes (Signed)
This encounter was created in error - please disregard.

## 2016-08-31 ENCOUNTER — Inpatient Hospital Stay (HOSPITAL_COMMUNITY)
Admission: EM | Admit: 2016-08-31 | Discharge: 2016-09-02 | DRG: 189 | Disposition: A | Discharge status: Home / Self Care | Attending: Family Medicine | Admitting: Family Medicine

## 2016-08-31 ENCOUNTER — Emergency Department (HOSPITAL_COMMUNITY)

## 2016-08-31 ENCOUNTER — Encounter (HOSPITAL_COMMUNITY): Payer: Self-pay

## 2016-08-31 DIAGNOSIS — Z9981 Dependence on supplemental oxygen: Secondary | ICD-10-CM | POA: Diagnosis not present

## 2016-08-31 DIAGNOSIS — I4719 Other supraventricular tachycardia: Secondary | ICD-10-CM | POA: Diagnosis present

## 2016-08-31 DIAGNOSIS — J9621 Acute and chronic respiratory failure with hypoxia: Principal | ICD-10-CM | POA: Diagnosis present

## 2016-08-31 DIAGNOSIS — Z9889 Other specified postprocedural states: Secondary | ICD-10-CM | POA: Diagnosis not present

## 2016-08-31 DIAGNOSIS — Z79899 Other long term (current) drug therapy: Secondary | ICD-10-CM | POA: Diagnosis not present

## 2016-08-31 DIAGNOSIS — F419 Anxiety disorder, unspecified: Secondary | ICD-10-CM | POA: Diagnosis present

## 2016-08-31 DIAGNOSIS — I10 Essential (primary) hypertension: Secondary | ICD-10-CM | POA: Diagnosis present

## 2016-08-31 DIAGNOSIS — J449 Chronic obstructive pulmonary disease, unspecified: Secondary | ICD-10-CM | POA: Diagnosis not present

## 2016-08-31 DIAGNOSIS — J9620 Acute and chronic respiratory failure, unspecified whether with hypoxia or hypercapnia: Secondary | ICD-10-CM | POA: Diagnosis present

## 2016-08-31 DIAGNOSIS — I4891 Unspecified atrial fibrillation: Secondary | ICD-10-CM | POA: Diagnosis present

## 2016-08-31 DIAGNOSIS — W19XXXA Unspecified fall, initial encounter: Secondary | ICD-10-CM | POA: Diagnosis present

## 2016-08-31 DIAGNOSIS — Z87891 Personal history of nicotine dependence: Secondary | ICD-10-CM

## 2016-08-31 DIAGNOSIS — I447 Left bundle-branch block, unspecified: Secondary | ICD-10-CM | POA: Diagnosis present

## 2016-08-31 DIAGNOSIS — J441 Chronic obstructive pulmonary disease with (acute) exacerbation: Secondary | ICD-10-CM | POA: Diagnosis present

## 2016-08-31 DIAGNOSIS — I471 Supraventricular tachycardia: Secondary | ICD-10-CM | POA: Diagnosis present

## 2016-08-31 LAB — CBC WITH DIFFERENTIAL/PLATELET
Basophils Absolute: 0 10*3/uL (ref 0.0–0.1)
Basophils Relative: 0 %
EOS PCT: 2 %
Eosinophils Absolute: 0.3 10*3/uL (ref 0.0–0.7)
HCT: 45.3 % (ref 39.0–52.0)
Hemoglobin: 14.4 g/dL (ref 13.0–17.0)
LYMPHS ABS: 3.6 10*3/uL (ref 0.7–4.0)
LYMPHS PCT: 32 %
MCH: 30.4 pg (ref 26.0–34.0)
MCHC: 31.8 g/dL (ref 30.0–36.0)
MCV: 95.6 fL (ref 78.0–100.0)
MONOS PCT: 8 %
Monocytes Absolute: 0.9 10*3/uL (ref 0.1–1.0)
Neutro Abs: 6.5 10*3/uL (ref 1.7–7.7)
Neutrophils Relative %: 58 %
PLATELETS: 128 10*3/uL — AB (ref 150–400)
RBC: 4.74 MIL/uL (ref 4.22–5.81)
RDW: 15.4 % (ref 11.5–15.5)
WBC: 11.2 10*3/uL — ABNORMAL HIGH (ref 4.0–10.5)

## 2016-08-31 LAB — I-STAT TROPONIN, ED: TROPONIN I, POC: 0.07 ng/mL (ref 0.00–0.08)

## 2016-08-31 LAB — I-STAT CHEM 8, ED
BUN: 26 mg/dL — ABNORMAL HIGH (ref 6–20)
CALCIUM ION: 1.11 mmol/L — AB (ref 1.15–1.40)
CREATININE: 1.1 mg/dL (ref 0.61–1.24)
Chloride: 101 mmol/L (ref 101–111)
GLUCOSE: 148 mg/dL — AB (ref 65–99)
HCT: 46 % (ref 39.0–52.0)
HEMOGLOBIN: 15.6 g/dL (ref 13.0–17.0)
Potassium: 4.3 mmol/L (ref 3.5–5.1)
Sodium: 141 mmol/L (ref 135–145)
TCO2: 33 mmol/L (ref 0–100)

## 2016-08-31 LAB — INFLUENZA PANEL BY PCR (TYPE A & B)
Influenza A By PCR: NEGATIVE
Influenza B By PCR: NEGATIVE

## 2016-08-31 MED ORDER — MAGNESIUM SULFATE 2 GM/50ML IV SOLN
2.0000 g | Freq: Once | INTRAVENOUS | Status: AC
  Administered 2016-08-31: 2 g via INTRAVENOUS
  Filled 2016-08-31: qty 50

## 2016-08-31 MED ORDER — IPRATROPIUM-ALBUTEROL 0.5-2.5 (3) MG/3ML IN SOLN
6.0000 mL | Freq: Once | RESPIRATORY_TRACT | Status: AC
  Administered 2016-08-31: 6 mL via RESPIRATORY_TRACT
  Filled 2016-08-31: qty 6

## 2016-08-31 MED ORDER — BENZONATATE 100 MG PO CAPS
200.0000 mg | ORAL_CAPSULE | Freq: Three times a day (TID) | ORAL | Status: DC | PRN
  Administered 2016-09-01 – 2016-09-02 (×2): 200 mg via ORAL
  Filled 2016-08-31 (×2): qty 2

## 2016-08-31 MED ORDER — DOXYCYCLINE HYCLATE 100 MG IV SOLR
100.0000 mg | Freq: Two times a day (BID) | INTRAVENOUS | Status: DC
  Administered 2016-08-31 – 2016-09-02 (×4): 100 mg via INTRAVENOUS
  Filled 2016-08-31 (×6): qty 100

## 2016-08-31 MED ORDER — ACETAMINOPHEN 650 MG RE SUPP: 650.0000 mg | Freq: Four times a day (QID) | RECTAL | Status: DC | PRN

## 2016-08-31 MED ORDER — CHLORHEXIDINE GLUCONATE 0.12 % MT SOLN
15.0000 mL | Freq: Two times a day (BID) | OROMUCOSAL | Status: DC
  Administered 2016-09-01: 15 mL via OROMUCOSAL
  Filled 2016-08-31: qty 15

## 2016-08-31 MED ORDER — SENNOSIDES-DOCUSATE SODIUM 8.6-50 MG PO TABS
2.0000 | ORAL_TABLET | Freq: Two times a day (BID) | ORAL | Status: DC
Start: 2016-08-31 — End: 2016-09-02
  Administered 2016-08-31 – 2016-09-01 (×3): 2 via ORAL
  Filled 2016-08-31 (×4): qty 2

## 2016-08-31 MED ORDER — LEVOFLOXACIN IN D5W 750 MG/150ML IV SOLN: 750.0000 mg | INTRAVENOUS | Status: DC

## 2016-08-31 MED ORDER — ORAL CARE MOUTH RINSE: 15.0000 mL | Freq: Two times a day (BID) | OROMUCOSAL | Status: DC

## 2016-08-31 MED ORDER — ACETAMINOPHEN 325 MG PO TABS: 650.0000 mg | ORAL_TABLET | Freq: Four times a day (QID) | ORAL | Status: DC | PRN

## 2016-08-31 MED ORDER — ASPIRIN 325 MG PO TABS
325.0000 mg | ORAL_TABLET | Freq: Once | ORAL | Status: AC
  Administered 2016-08-31: 325 mg via ORAL

## 2016-08-31 MED ORDER — MAGNESIUM CITRATE PO SOLN: 1.0000 | Freq: Once | ORAL | Status: DC | PRN

## 2016-08-31 MED ORDER — POLYETHYLENE GLYCOL 3350 17 G PO PACK: 17.0000 g | PACK | Freq: Every day | ORAL | Status: DC | PRN

## 2016-08-31 MED ORDER — MOMETASONE FURO-FORMOTEROL FUM 200-5 MCG/ACT IN AERO
2.0000 | INHALATION_SPRAY | Freq: Two times a day (BID) | RESPIRATORY_TRACT | Status: DC
  Administered 2016-08-31 – 2016-09-02 (×4): 2 via RESPIRATORY_TRACT
  Filled 2016-08-31: qty 8.8

## 2016-08-31 MED ORDER — LEVOFLOXACIN IN D5W 500 MG/100ML IV SOLN
500.0000 mg | INTRAVENOUS | Status: DC
  Filled 2016-08-31: qty 100

## 2016-08-31 MED ORDER — OSELTAMIVIR PHOSPHATE 75 MG PO CAPS
75.0000 mg | ORAL_CAPSULE | ORAL | Status: AC
  Administered 2016-08-31: 75 mg via ORAL

## 2016-08-31 MED ORDER — LORAZEPAM 2 MG/ML IJ SOLN
0.5000 mg | Freq: Four times a day (QID) | INTRAMUSCULAR | Status: DC | PRN
  Administered 2016-08-31: 0.5 mg via INTRAVENOUS
  Filled 2016-08-31: qty 1

## 2016-08-31 MED ORDER — IPRATROPIUM-ALBUTEROL 0.5-2.5 (3) MG/3ML IN SOLN
3.0000 mL | Freq: Four times a day (QID) | RESPIRATORY_TRACT | Status: DC
  Administered 2016-08-31 – 2016-09-02 (×8): 3 mL via RESPIRATORY_TRACT
  Filled 2016-08-31 (×9): qty 3

## 2016-08-31 MED ORDER — METHYLPREDNISOLONE SODIUM SUCC 125 MG IJ SOLR
125.0000 mg | Freq: Once | INTRAMUSCULAR | Status: AC
  Administered 2016-08-31: 125 mg via INTRAVENOUS
  Filled 2016-08-31: qty 2

## 2016-08-31 MED ORDER — BISACODYL 10 MG RE SUPP: 10.0000 mg | Freq: Every day | RECTAL | Status: DC | PRN

## 2016-08-31 MED ORDER — MAGNESIUM SULFATE 50 % IJ SOLN: 2.0000 g | Freq: Once | INTRAMUSCULAR | Status: DC

## 2016-08-31 MED ORDER — CITALOPRAM HYDROBROMIDE 20 MG PO TABS
20.0000 mg | ORAL_TABLET | Freq: Every day | ORAL | Status: DC
  Administered 2016-09-01 – 2016-09-02 (×2): 20 mg via ORAL
  Filled 2016-08-31 (×2): qty 1

## 2016-08-31 MED ORDER — ENOXAPARIN SODIUM 40 MG/0.4ML ~~LOC~~ SOLN
40.0000 mg | SUBCUTANEOUS | Status: DC
  Administered 2016-09-01 – 2016-09-02 (×2): 40 mg via SUBCUTANEOUS
  Filled 2016-08-31 (×2): qty 0.4

## 2016-08-31 MED ORDER — SODIUM CHLORIDE 0.9 % IV SOLN: INTRAVENOUS | Status: DC

## 2016-08-31 MED ORDER — SODIUM CHLORIDE 0.9% FLUSH
3.0000 mL | Freq: Two times a day (BID) | INTRAVENOUS | Status: DC
  Administered 2016-08-31 – 2016-09-01 (×3): 3 mL via INTRAVENOUS

## 2016-08-31 MED ORDER — PANTOPRAZOLE SODIUM 40 MG PO TBEC
40.0000 mg | DELAYED_RELEASE_TABLET | Freq: Every day | ORAL | Status: DC
Start: 2016-09-01 — End: 2016-09-02
  Administered 2016-09-01 – 2016-09-02 (×2): 40 mg via ORAL
  Filled 2016-08-31 (×3): qty 1

## 2016-08-31 MED ORDER — IPRATROPIUM BROMIDE 0.02 % IN SOLN
0.5000 mg | Freq: Once | RESPIRATORY_TRACT | Status: AC
  Administered 2016-08-31: 0.5 mg via RESPIRATORY_TRACT
  Filled 2016-08-31: qty 2.5

## 2016-08-31 MED ORDER — METHYLPREDNISOLONE SODIUM SUCC 125 MG IJ SOLR
80.0000 mg | Freq: Four times a day (QID) | INTRAMUSCULAR | Status: AC
  Administered 2016-08-31 – 2016-09-01 (×4): 80 mg via INTRAVENOUS
  Filled 2016-08-31 (×4): qty 2

## 2016-08-31 MED ORDER — ALBUTEROL SULFATE (2.5 MG/3ML) 0.083% IN NEBU
5.0000 mg | INHALATION_SOLUTION | Freq: Once | RESPIRATORY_TRACT | Status: AC
  Administered 2016-08-31: 5 mg via RESPIRATORY_TRACT
  Filled 2016-08-31: qty 6

## 2016-08-31 MED ORDER — PREDNISONE 20 MG PO TABS: 40.0000 mg | ORAL_TABLET | Freq: Every day | ORAL | Status: DC

## 2016-08-31 MED ORDER — ASPIRIN 325 MG PO TABS
325.0000 mg | ORAL_TABLET | Freq: Every day | ORAL | Status: DC
  Filled 2016-08-31: qty 1

## 2016-08-31 MED ORDER — ALBUTEROL SULFATE (2.5 MG/3ML) 0.083% IN NEBU: 2.5000 mg | INHALATION_SOLUTION | RESPIRATORY_TRACT | Status: DC | PRN

## 2016-08-31 NOTE — Progress Notes (Signed)
Pharmacy Antibiotic Note  [name] is a 83 y.o. male admitted on 08/31/2016 with COPD exacerbation. Pt reports worsening SOB and presents in respiratory distress requiring BiPAP. WBC elevated, SCr 1.10 (GFR ~58 ml/min). Pharmacy has been consulted for levofloxacin dosing.  Plan: -Levofloxacin 500mg  IV q24h -Monitor renal function closely -Follow-up cultures, LOT - narrow as appropriate    No data recorded.   Recent Labs Lab 08/31/16 1620 08/31/16 1627  WBC 11.2*  --   CREATININE  --  1.10    CrCl cannot be calculated (Unknown ideal weight.).    No Known Allergies  Antimicrobials this admission: 2/14 Levofloxacin >>   Dose adjustments this admission: none  Microbiology results: none  Thank you for allowing pharmacy to be a part of this patient's care.  Arrie Senate, PharmD PGY-1 Pharmacy Resident Pager: [phone] 08/31/2016

## 2016-08-31 NOTE — ED Notes (Signed)
Attempted to medicate pt with aspirin but O2 sats low at this time

## 2016-08-31 NOTE — ED Triage Notes (Signed)
Pt. Called out today stating he couldn't breathe. When EMS arrived they found him doing his own home breathing treatment and still on 6L [place] with O2 sats at 70%. EMS administered 5 albuterol and .5 atrovent. Pt. Came in on C-pap with sats at 100%. Bi-pap administered immediately once here

## 2016-08-31 NOTE — ED Notes (Signed)
Hospice nurse Amy # [phone]

## 2016-08-31 NOTE — H&P (Signed)
TRH H&P   Patient Demographics:    [name], is a 83 y.o. male  MRN: [id number]   DOB - 1934/03/11  Admit Date - 08/31/2016  Outpatient Primary MD for the patient is Pcp Not In System  Referring MD/NP/PA: Dr Jarold Song  Patient coming from: home/lives with daughter  Chief Complaint  Patient presents with  . Shortness of Breath      HPI:    [name]  is a 83 y.o. male, With past medical history of COPD , chronic respiratory failure on 4-5 L nasal cannula, hypertension, followed by home hospice(fast COPD discharged to be in place in August 2017), patient presents with worsening dyspnea over last 24 hours, he reports cough, productive sputum, exertional dyspnea over the last 24 hours, reports daughter has respiratory symptoms, nausea and vomiting over last 24 hours, denies any chest pain, fever, chills, nausea, vomiting, was found to be hypoxic 70% on his nasal cannula by EMS, somnolent in ED was significant decreased air entry, quick turnaround after receiving IV steroids, magnesium and started on BiPAP, more awake and alert and communicative, he change his CODE STATUS to full code as he has great grandchild due in April, no significant labs abnormalities, EKG with new left bundle branch block, he denies any chest pain, I was called to admit.    Review of systems:    In addition to the HPI above,  No Fever-chills, No Headache, No changes with Vision or hearing, No problems swallowing food or Liquids, No Chest pain, Reports cough with productive sputum, progressive dyspnea, No Abdominal pain, No Nausea or Vommitting, Bowel movements are regular, No Blood in stool or Urine, No dysuria, No new skin rashes or bruises, No new joints pains-aches,  No new weakness, tingling, numbness in any extremity, No recent weight gain or loss, No polyuria, polydypsia or polyphagia, No  significant Mental Stressors.  A full 10 point Review of Systems was done, except as stated above, all other Review of Systems were negative.   With Past History of the following :    Past Medical History:  Diagnosis Date  . Anxiety   . Asthma   . COPD (chronic obstructive pulmonary disease) ([place])   . Depression   . Emphysema of lung ([place])   . Hypertension   . Multiple lung nodules   . On home O2    3L N/C continuous  . Oxygen deficiency   . Shortness of breath dyspnea       Past Surgical History:  Procedure Laterality Date  . HERNIA REPAIR  2008  . lump removed from neck  2004      Social History:     Social History  Substance Use Topics  . Smoking status: Former Smoker    Packs/day: 1.50    Years: 50.00    Types: Cigarettes    Quit date: 09/06/2002  . Smokeless tobacco:  Never Used  . Alcohol use No     Lives - At home with daughter  Mobility - with assistance     Family History :   Family history was reviewed, noncontributory   Home Medications:   Prior to Admission medications   Medication Sig Start Date End Date Taking? Authorizing Provider  acetaminophen (TYLENOL) 325 MG tablet Take 650 mg by mouth every 4 (four) hours as needed for mild pain, moderate pain or fever.    Historical Provider, MD  ALPRAZolam Duanne Moron) 0.5 MG tablet Take 1 tablet (0.5 mg total) by mouth at bedtime. 02/20/16   Kelvin Cellar, MD  benzonatate (TESSALON) 200 MG capsule Take 1 capsule (200 mg total) by mouth 3 (three) times daily as needed for cough. 02/20/16   Kelvin Cellar, MD  bisacodyl (DULCOLAX) 10 MG suppository Place 10 mg rectally daily as needed for moderate constipation.    Historical Provider, MD  budesonide-formoterol (SYMBICORT) 160-4.5 MCG/ACT inhaler Inhale 2 puffs into the lungs 2 (two) times daily.    Historical Provider, MD  citalopram (CELEXA) 20 MG tablet Take 20 mg by mouth daily.    Historical Provider, MD  ipratropium-albuterol (DUONEB) 0.5-2.5 (3)  MG/3ML SOLN Take 3 mLs by nebulization every 6 (six) hours as needed.    Historical Provider, MD  LORazepam (ATIVAN) 0.5 MG tablet Take 0.5 mg by mouth See admin instructions. Take 0.5 mg by mouth every 4 hours as needed and each night at bedtime 07/08/16   Historical Provider, MD  morphine (ROXANOL) 20 MG/ML concentrated solution Take by mouth. Give 10 mg to 20 mg every four hours as needed for moderate to severe pain by mouth    Historical Provider, MD  omeprazole (PRILOSEC) 40 MG capsule Take 40 mg by mouth daily. 07/09/16   Historical Provider, MD  oxyCODONE-acetaminophen (PERCOCET/ROXICET) 5-325 MG tablet Take 1 tablet by mouth 4 (four) times daily as needed.     Historical Provider, MD  predniSONE (DELTASONE) 10 MG tablet Take 10 mg by mouth daily as needed.    Historical Provider, MD  sennosides-docusate sodium (SENOKOT-S) 8.6-50 MG tablet Take 2 tablets by mouth 2 (two) times daily.    Historical Provider, MD     Allergies:    No Known Allergies   Physical Exam:   Vitals  Blood pressure 104/77, pulse (!) 124, resp. rate 22, SpO2 100 %.   1. General Chronically ill appearing frail elderly male lying in bed in mild respiratory distress  2. Normal affect and insight, Not Suicidal or Homicidal, Awake Alert, Oriented X 3.  3. No F.N deficits, ALL C.Nerves Intact, Strength 5/5 all 4 extremities, Sensation intact all 4 extremities, Plantars down going.  4. Ears and Eyes appear Normal, Conjunctivae clear, PERRLA. Moist Oral Mucosa.  5. Supple Neck, No JVD, No cervical lymphadenopathy appriciated, No Carotid Bruits.  6. Symmetrical Chest wall movement, managed air movement bilaterally, diffuse wheezing  7. Tachycardic, No Gallops, Rubs or Murmurs, No Parasternal Heave.  8. Positive Bowel Sounds, Abdomen Soft, No tenderness, No organomegaly appriciated,No rebound -guarding or rigidity.  9.  No Cyanosis, Normal Skin Turgor, significant bruising in right upper chest area  10. Good  muscle tone,  joints appear normal ,  no effusions, Normal ROM.  11. No Palpable Lymph Nodes in Neck or Axillae     Data Review:    CBC  Recent Labs Lab 08/31/16 1620 08/31/16 1627  WBC 11.2*  --   HGB 14.4 15.6  HCT 45.3 46.0  PLT 128*  --   MCV 95.6  --   MCH 30.4  --   MCHC 31.8  --   RDW 15.4  --   LYMPHSABS 3.6  --   MONOABS 0.9  --   EOSABS 0.3  --   BASOSABS 0.0  --    ------------------------------------------------------------------------------------------------------------------  Chemistries   Recent Labs Lab 08/31/16 1627  NA 141  K 4.3  CL 101  GLUCOSE 148*  BUN 26*  CREATININE 1.10   ------------------------------------------------------------------------------------------------------------------ CrCl cannot be calculated (Unknown ideal weight.). ------------------------------------------------------------------------------------------------------------------ No results for input(s): TSH, T4TOTAL, T3FREE, THYROIDAB in the last 72 hours.  Invalid input(s): FREET3  Coagulation profile No results for input(s): INR, PROTIME in the last 168 hours. ------------------------------------------------------------------------------------------------------------------- No results for input(s): DDIMER in the last 72 hours. -------------------------------------------------------------------------------------------------------------------  Cardiac Enzymes No results for input(s): CKMB, TROPONINI, MYOGLOBIN in the last 168 hours.  Invalid input(s): CK ------------------------------------------------------------------------------------------------------------------ No results found for: BNP   ---------------------------------------------------------------------------------------------------------------  Urinalysis    Component Value Date/Time   COLORURINE YELLOW 09/13/2014 2348   APPEARANCEUR CLEAR 09/13/2014 2348   LABSPEC 1.022 09/13/2014 2348    PHURINE 6.0 09/13/2014 2348   GLUCOSEU NEGATIVE 09/13/2014 2348   HGBUR LARGE (A) 09/13/2014 2348   BILIRUBINUR NEGATIVE 09/13/2014 2348   KETONESUR NEGATIVE 09/13/2014 2348   PROTEINUR NEGATIVE 09/13/2014 2348   UROBILINOGEN 0.2 09/13/2014 2348   NITRITE NEGATIVE 09/13/2014 2348   LEUKOCYTESUR NEGATIVE 09/13/2014 2348    ----------------------------------------------------------------------------------------------------------------   Imaging Results:    Dg Chest Portable 1 View  Result Date: 08/31/2016 CLINICAL DATA:  Acute onset of respiratory distress. Initial encounter. EXAM: PORTABLE CHEST 1 VIEW COMPARISON:  Chest radiograph performed 08/05/2016 FINDINGS: The lungs are hyperexpanded, with flattening of the hemidiaphragms, compatible with COPD. Mild bilateral parenchymal scarring is noted. Peribronchial thickening is noted. There is no evidence of pleural effusion or pneumothorax, though the lung bases are incompletely imaged on this study. The cardiomediastinal silhouette is borderline normal in size. No acute osseous abnormalities are seen. IMPRESSION: Findings of COPD, with mild bilateral parenchymal scarring. No focal airspace consolidation seen. Electronically Signed   By: Garald Balding M.D.   On: 08/31/2016 16:21    My personal review of EKG: Rhythm NSR, Rate  135 /min, QTc507, With new left bundle branch block   Assessment & Plan:    Active Problems:   COPD exacerbation (HCC)   Acute on chronic respiratory failure (HCC)   Atrial tachycardia, multifocal (HCC)  Acute on chronic hypoxic respiratory failure - Patient on 4-5 L nasal cannula at baseline, hypoxic in the 70s by EMS, altered mental status and presentation, most likely as well with hypercarbia. - Improving on BiPAP, continue with the BiPAP overnight , continued to treat his COPD.  COPD exacerbation - Thickened wheezing and decreased air entry on presentation, improved with magnesium, Solu-Medrol and continuous  nebs, continue with IV soluMedrol, DuoNeb's, and dulera, given productive sputum will start doxycycline. - Patient reports daughter has respiratory symptoms over last 24 hours, will give one dose of Tamiflu empirically pending influenza PCR results  Sinus tachycardia with left bundle branch block - Patient with known history of multifocal atrial tachycardia in the past, currently most likely exacerbation secondary to respiratory distress, has new left bundle branch block, he denies any chest pain, troponin negative in ED, will give her one full dose aspirin, will obtain 2-D echo.    DVT Prophylaxis  Lovenox - SCDs  AM Labs Ordered, also please review Full Orders  Family Communication:  Admission, patients condition and plan of care including tests being ordered have been discussed with the patient and son who indicate understanding and agree with the plan and Code Status.  Code Status Full code, but does not want to be kept alive on life support if poor prognosis confirmed by patient   Likely DC to  Pending further work up.  Condition GUARDED    Consults called: None  Admission status: inpatient  Time spent in minutes : 65 minutes   Chyrel Taha M.D on 08/31/2016 at 6:16 PM  Between 7am to 7pm - Pager - [phone]. After 7pm go to [email or web address] - password [facility]  [facility] - Office  [phone]

## 2016-08-31 NOTE — ED Provider Notes (Signed)
[facility] DEPT Provider Note   CSN: [id number] Arrival date & time: 08/31/16  1602     History   Chief Complaint Chief Complaint  Patient presents with  . Shortness of Breath    HPI [name] is a 83 y.o. male.  HPI 83 year old male in hospice for end-stage COPD but full code, presents with concerns of worsening shortness of breath from home. History obtained from EMS as patient is too somnolent. EMS states that when they arrived that patient was trying to do a breathing treatment on his nasal cannula and was 70%. He was in respiratory distress and they initiated BiPAP which has been ongoing for about 25 minutes.. They did one DuoNeb but did not administer additional medications. He did become less somnolent in route and was opening his eyes and nodding his head which was better. Patient is unable to provide history due to somnolence and acuity of condition. Per EMS, the daughter at the bedside at home had been sick recently. Review of records show that patient recently was seen here after a fall and had a chest x-ray which was negative for fractures.  Past Medical History:  Diagnosis Date  . Anxiety   . Asthma   . COPD (chronic obstructive pulmonary disease) ([place])   . Depression   . Emphysema of lung ([place])   . Hypertension   . Multiple lung nodules   . On home O2    3L N/C continuous  . Oxygen deficiency   . Shortness of breath dyspnea     Patient Active Problem List   Diagnosis Date Noted  . Respiratory distress   . Abdominal pain, diffuse   . Constipation 02/14/2016  . Chronic respiratory failure with hypoxia ([place]) 06/13/2014  . Protein-calorie malnutrition ([place]) 06/13/2014  . Multifocal atrial tachycardia ([place]) 06/13/2014  . Systolic CHF with reduced left ventricular function, NYHA class 2 ([place]) 06/13/2014  . Bradycardia   . COPD with acute exacerbation ([place]) 06/09/2014  . Atrial tachycardia, multifocal ([place]) 06/09/2014  . COPD exacerbation ([place])  09/12/2013  . Acute on chronic respiratory failure ([place]) 09/12/2013  . COPD GOLD IV/ 02 dep 06/06/2013  . O2 dependent 06/06/2013  . Renal stone 06/06/2013  . Abnormal chest x-ray with multiple lung nodules 06/06/2013  . Cholelithiasis 09/06/2012    Past Surgical History:  Procedure Laterality Date  . HERNIA REPAIR  2008  . lump removed from neck  2004       Home Medications    Prior to Admission medications   Medication Sig Start Date End Date Taking? Authorizing Provider  acetaminophen (TYLENOL) 325 MG tablet Take 650 mg by mouth every 4 (four) hours as needed for mild pain, moderate pain or fever.    Historical Provider, MD  ALPRAZolam Duanne Moron) 0.5 MG tablet Take 1 tablet (0.5 mg total) by mouth at bedtime. 02/20/16   Kelvin Cellar, MD  benzonatate (TESSALON) 200 MG capsule Take 1 capsule (200 mg total) by mouth 3 (three) times daily as needed for cough. 02/20/16   Kelvin Cellar, MD  bisacodyl (DULCOLAX) 10 MG suppository Place 10 mg rectally daily as needed for moderate constipation.    Historical Provider, MD  budesonide-formoterol (SYMBICORT) 160-4.5 MCG/ACT inhaler Inhale 2 puffs into the lungs 2 (two) times daily.    Historical Provider, MD  citalopram (CELEXA) 20 MG tablet Take 20 mg by mouth daily.    Historical Provider, MD  ipratropium-albuterol (DUONEB) 0.5-2.5 (3) MG/3ML SOLN Take 3 mLs by nebulization every 6 (six)  hours as needed.    Historical Provider, MD  LORazepam (ATIVAN) 0.5 MG tablet Take 0.5 mg by mouth See admin instructions. Take 0.5 mg by mouth every 4 hours as needed and each night at bedtime 07/08/16   Historical Provider, MD  morphine (ROXANOL) 20 MG/ML concentrated solution Take by mouth. Give 10 mg to 20 mg every four hours as needed for moderate to severe pain by mouth    Historical Provider, MD  omeprazole (PRILOSEC) 40 MG capsule Take 40 mg by mouth daily. 07/09/16   Historical Provider, MD  oxyCODONE-acetaminophen (PERCOCET/ROXICET) 5-325 MG tablet  Take 1 tablet by mouth 4 (four) times daily as needed.     Historical Provider, MD  predniSONE (DELTASONE) 10 MG tablet Take 10 mg by mouth daily as needed.    Historical Provider, MD  sennosides-docusate sodium (SENOKOT-S) 8.6-50 MG tablet Take 2 tablets by mouth 2 (two) times daily.    Historical Provider, MD    Family History No family history on file.  Social History Social History  Substance Use Topics  . Smoking status: Former Smoker    Packs/day: 1.50    Years: 50.00    Types: Cigarettes    Quit date: 09/06/2002  . Smokeless tobacco: Never Used  . Alcohol use No     Allergies   Patient has no known allergies.   Review of Systems Review of Systems  Unable to perform ROS: Acuity of condition     Physical Exam Updated Vital Signs BP 104/77   Pulse (!) 124   Resp 22   SpO2 100%   Physical Exam  Constitutional: He appears well-developed and well-nourished. He appears distressed.  HENT:  Head: Normocephalic and atraumatic.  Left Ear: External ear normal.  Eyes: Conjunctivae are normal. Pupils are equal, round, and reactive to light. Right eye exhibits no discharge. Left eye exhibits no discharge.  Neck: Normal range of motion. Neck supple.  Cardiovascular:  No murmur heard. Tachycardic Afib  Pulmonary/Chest: He is in respiratory distress. He has wheezes. He exhibits no tenderness.  Abdominal: Soft. Bowel sounds are normal. He exhibits no distension and no mass. There is no tenderness. There is no rebound and no guarding.  Musculoskeletal: He exhibits no edema.  Neurological:  Somnolent, but opens eyes to voice Moves all four extremities  Skin: Skin is warm. He is not diaphoretic.  Bruising along right side of chest  Psychiatric: He has a normal mood and affect.  Nursing note and vitals reviewed.    ED Treatments / Results  Labs (all labs ordered are listed, but only abnormal results are displayed) Labs Reviewed  CBC WITH DIFFERENTIAL/PLATELET -  Abnormal; Notable for the following:       Result Value   WBC 11.2 (*)    All other components within normal limits  I-STAT CHEM 8, ED - Abnormal; Notable for the following:    BUN 26 (*)    Glucose, Bld 148 (*)    Calcium, Ion 1.11 (*)    All other components within normal limits  BLOOD GAS, VENOUS  I-STAT TROPOININ, ED    EKG  EKG Interpretation  Date/Time:  Wednesday August 31 2016 16:08:30 EST Ventricular Rate:  135 PR Interval:    QRS Duration: 123 QT Interval:  338 QTC Calculation: 507 R Axis:   43 Text Interpretation:  Sinus tachycardia Atrial premature complex Left bundle branch block Confirmed by Ashok Cordia  MD, Lennette Bihari ([id number]) on 08/31/2016 4:11:16 PM       Radiology  Dg Chest Portable 1 View  Result Date: 08/31/2016 CLINICAL DATA:  Acute onset of respiratory distress. Initial encounter. EXAM: PORTABLE CHEST 1 VIEW COMPARISON:  Chest radiograph performed 08/05/2016 FINDINGS: The lungs are hyperexpanded, with flattening of the hemidiaphragms, compatible with COPD. Mild bilateral parenchymal scarring is noted. Peribronchial thickening is noted. There is no evidence of pleural effusion or pneumothorax, though the lung bases are incompletely imaged on this study. The cardiomediastinal silhouette is borderline normal in size. No acute osseous abnormalities are seen. IMPRESSION: Findings of COPD, with mild bilateral parenchymal scarring. No focal airspace consolidation seen. Electronically Signed   By: Garald Balding M.D.   On: 08/31/2016 16:21    Procedures Procedures (including critical care time)  Medications Ordered in ED Medications  magnesium sulfate IVPB 2 g 50 mL (2 g Intravenous New Bag/Given 08/31/16 1636)  albuterol (PROVENTIL) (2.5 MG/3ML) 0.083% nebulizer solution 5 mg (not administered)  ipratropium (ATROVENT) nebulizer solution 0.5 mg (not administered)  methylPREDNISolone sodium succinate (SOLU-MEDROL) 125 mg/2 mL injection 125 mg (125 mg Intravenous Given  08/31/16 1614)  ipratropium-albuterol (DUONEB) 0.5-2.5 (3) MG/3ML nebulizer solution 6 mL (6 mLs Nebulization Given 08/31/16 1636)     Initial Impression / Assessment and Plan / ED Course  I have reviewed the triage vital signs and the nursing notes.  Pertinent labs & imaging results that were available during my care of the patient were reviewed by me and considered in my medical decision making (see chart for details).     CXR wo PNA, EKG wo ACS. Labs reassuring, mild leukocytosis. Sick contacts at home. S/p solumedrol, duoneb and feels much better. BiPAP  Helping.   5:20p - Pt reassessed and now alert, oriented, able to converse while on CPAP. Code discussion with pt and son at bedside. Pt has his first great-grandchild due in April. As a result, he wants to be full code until April to attempt to make it to meet baby.   Will admit.   Final Clinical Impressions(s) / ED Diagnoses   Final diagnoses:  COPD exacerbation [facility])    New Prescriptions New Prescriptions   No medications on file     Karma Greaser, MD 09/01/16 0030    Lajean Saver, MD 09/06/16 [phone]

## 2016-08-31 NOTE — ED Notes (Signed)
Spoke with respiratory about changing O2 sat goal

## 2016-08-31 NOTE — Progress Notes (Signed)
[facility] Liaison:  RN visit  Met with patient and son at bedside.   Patient arrives to ED in resp distress.  Trying to clarify with patient about code status.  Patient states he is indeed DNR, but "I want to be put on the ventilator".  Patient son at bedside and will talk with the ED doctor.     Patient called EMS himself after getting "short of breath real bad at home".  Patient didn't tell his daughter that he had called EMS, who lives in the same home.   Daughter told EMS, "I checked on him a few minutes before and he was fine".     Patient states "feeling better since I'm on the machine".  (bipap).  Respiratory at bedside.   Will continue to follow while in the hospital.  Thank you.  Edyth Gunnels, RN, BSN [facility] Liaison [phone]  All hospital liaison's are now on [place].  Please feel free to call me directly at the above number or call hospice at [phone] after 5pm.

## 2016-08-31 NOTE — Progress Notes (Signed)
Flag appeared upon opening chart, this pt is followed by [facility], contact for additional information on this patients wishes.   This RN paged the hospice group.   Physician notified: Elgergawy At: 18  Regarding:  Flag on chart, this pt is followed by [facility]. PAged, awaiting call back for patient follow up.  Awaiting return response.   Returned Response at: [employer]): Pt full code. Wants to live to see grandchild born in April.

## 2016-09-01 ENCOUNTER — Inpatient Hospital Stay (HOSPITAL_COMMUNITY)

## 2016-09-01 LAB — CBC
HCT: 43.1 % (ref 39.0–52.0)
Hemoglobin: 13.7 g/dL (ref 13.0–17.0)
MCH: 30 pg (ref 26.0–34.0)
MCHC: 31.8 g/dL (ref 30.0–36.0)
MCV: 94.3 fL (ref 78.0–100.0)
PLATELETS: 105 10*3/uL — AB (ref 150–400)
RBC: 4.57 MIL/uL (ref 4.22–5.81)
RDW: 15.6 % — ABNORMAL HIGH (ref 11.5–15.5)
WBC: 10 10*3/uL (ref 4.0–10.5)

## 2016-09-01 LAB — BASIC METABOLIC PANEL
Anion gap: 12 (ref 5–15)
BUN: 22 mg/dL — ABNORMAL HIGH (ref 6–20)
CALCIUM: 8.9 mg/dL (ref 8.9–10.3)
CHLORIDE: 102 mmol/L (ref 101–111)
CO2: 25 mmol/L (ref 22–32)
CREATININE: 1.3 mg/dL — AB (ref 0.61–1.24)
GFR, EST AFRICAN AMERICAN: 58 mL/min — AB (ref >60)
GFR, EST NON AFRICAN AMERICAN: 50 mL/min — AB (ref >60)
Glucose, Bld: 163 mg/dL — ABNORMAL HIGH (ref 65–99)
Potassium: 3.6 mmol/L (ref 3.5–5.1)
SODIUM: 139 mmol/L (ref 135–145)

## 2016-09-01 LAB — MRSA PCR SCREENING: MRSA BY PCR: NEGATIVE

## 2016-09-01 MED ORDER — ONDANSETRON HCL 4 MG/2ML IJ SOLN
4.0000 mg | Freq: Three times a day (TID) | INTRAMUSCULAR | Status: DC | PRN
  Administered 2016-09-01: 4 mg via INTRAVENOUS
  Filled 2016-09-01: qty 2

## 2016-09-01 MED ORDER — METOPROLOL SUCCINATE ER 25 MG PO TB24
12.5000 mg | ORAL_TABLET | Freq: Every day | ORAL | Status: DC
  Administered 2016-09-01 – 2016-09-02 (×2): 12.5 mg via ORAL
  Filled 2016-09-01 (×2): qty 1

## 2016-09-01 MED ORDER — ORAL CARE MOUTH RINSE
15.0000 mL | Freq: Two times a day (BID) | OROMUCOSAL | Status: DC
  Administered 2016-09-02: 15 mL via OROMUCOSAL

## 2016-09-01 MED ORDER — ENSURE ENLIVE PO LIQD
237.0000 mL | Freq: Three times a day (TID) | ORAL | Status: DC
  Administered 2016-09-01 – 2016-09-02 (×3): 237 mL via ORAL

## 2016-09-01 MED ORDER — ALPRAZOLAM 0.5 MG PO TABS
0.5000 mg | ORAL_TABLET | Freq: Every day | ORAL | Status: DC
Start: 2016-09-01 — End: 2016-09-02
  Administered 2016-09-01: 0.5 mg via ORAL
  Filled 2016-09-01 (×2): qty 1

## 2016-09-01 MED ORDER — LORAZEPAM 2 MG/ML IJ SOLN
0.5000 mg | INTRAMUSCULAR | Status: DC | PRN
  Administered 2016-09-01: 0.5 mg via INTRAVENOUS
  Filled 2016-09-01: qty 1

## 2016-09-01 MED ORDER — GUAIFENESIN-DM 100-10 MG/5ML PO SYRP
10.0000 mL | ORAL_SOLUTION | ORAL | Status: DC | PRN
  Administered 2016-09-01: 10 mL via ORAL
  Filled 2016-09-01: qty 10

## 2016-09-01 MED ORDER — ASPIRIN 325 MG PO TABS
325.0000 mg | ORAL_TABLET | Freq: Every day | ORAL | Status: DC
  Administered 2016-09-01 – 2016-09-02 (×2): 325 mg via ORAL
  Filled 2016-09-01 (×2): qty 1

## 2016-09-01 MED ORDER — LORAZEPAM 0.5 MG PO TABS
0.5000 mg | ORAL_TABLET | Freq: Two times a day (BID) | ORAL | Status: DC | PRN
  Administered 2016-09-01: 0.5 mg via ORAL
  Filled 2016-09-01: qty 1

## 2016-09-01 MED ORDER — METHYLPREDNISOLONE SODIUM SUCC 125 MG IJ SOLR
60.0000 mg | Freq: Every day | INTRAMUSCULAR | Status: DC
  Administered 2016-09-02: 60 mg via INTRAVENOUS
  Filled 2016-09-01: qty 2

## 2016-09-01 MED ORDER — OXYCODONE-ACETAMINOPHEN 5-325 MG PO TABS
1.0000 | ORAL_TABLET | Freq: Four times a day (QID) | ORAL | Status: DC | PRN
  Administered 2016-09-01 – 2016-09-02 (×4): 1 via ORAL
  Filled 2016-09-01 (×4): qty 1

## 2016-09-01 MED ORDER — MORPHINE SULFATE (CONCENTRATE) 10 MG/0.5ML PO SOLN
6.0000 mg | ORAL | Status: DC | PRN
Start: 2016-09-01 — End: 2016-09-02
  Administered 2016-09-01 – 2016-09-02 (×2): 6 mg via ORAL
  Filled 2016-09-01 (×2): qty 0.5

## 2016-09-01 NOTE — Progress Notes (Signed)
PROGRESS NOTE    [name]  [id number] DOB: 01-05-1934 DOA: 08/31/2016 PCP: Pcp Not In System    Brief Narrative:  83 y.o. male, With past medical history of COPD , chronic respiratory failure on 4-5 L nasal cannula, hypertension, followed by home hospice(fast COPD discharged to be in place in August 2017), patient presents with worsening dyspnea over last 24 hours, he reports cough, productive sputum, exertional dyspnea over the last 24 hours, reports daughter has respiratory symptoms, nausea and vomiting over last 24 hours.  Currently being treated for copd exacerbation   Assessment & Plan:   Active Problems:   COPD exacerbation ([place]) - continue doxycycline - dulera, albuterol - continue solumedrol    Acute on chronic respiratory failure ([facility]) - bipap as needed. Continue close monitoring    Atrial tachycardia, atrial fibrillation - We'll start patient on Toprol, not anticoagulant candidate given hospice status. We'll place on aspirin  Anxiety -Continue anxiolytic  Chronic pain -Continue Roxanol as indicated by hospice nurse   DVT prophylaxis: Lovenox Code Status: Full Family Communication: Discussed with patient directly Disposition Plan: If patient respiratory status back to baseline we'll plan on transitioning back home with hospice   Consultants:   None   Procedures: None   Antimicrobials: doxycycline   Subjective: Pt has no new complaints. No acute issues overnight.  Objective: Vitals:   09/01/16 1300 09/01/16 1400 09/01/16 1436 09/01/16 1509  BP:      Pulse: 100 88    Resp: 19 17    Temp:      TempSrc:      SpO2: 95% 95%  96%  Weight:   77.1 kg (170 lb)   Height:   6\' 1"  (1.854 m)     Intake/Output Summary (Last 24 hours) at 09/01/16 1615 Last data filed at 09/01/16 1000  Gross per 24 hour  Intake              500 ml  Output              200 ml  Net              300 ml   Filed Weights   09/01/16 1436  Weight: 77.1 kg (170  lb)    Examination:  General exam: Appears calm and comfortable, in nad. Respiratory system: rhales, no wheezes, prolonged exp phase, equal chest rise. Cardiovascular system: S1 & S2 heard, RRR. No JVD, no rubs Gastrointestinal system: Abdomen is nondistended, soft and nontender. No organomegaly or masses felt. Normal bowel sounds heard. Central nervous system: Alert and oriented. No focal neurological deficits. Extremities: Symmetric tone Skin: No rashes, lesions or ulcers, on limited exam. Psychiatry: Mood & affect appropriate.     Data Reviewed: I have personally reviewed following labs and imaging studies  CBC:  Recent Labs Lab 08/31/16 1620 08/31/16 1627 09/01/16 0216  WBC 11.2*  --  10.0  NEUTROABS 6.5  --   --   HGB 14.4 15.6 13.7  HCT 45.3 46.0 43.1  MCV 95.6  --  94.3  PLT 128*  --  123456*   Basic Metabolic Panel:  Recent Labs Lab 08/31/16 1627 09/01/16 0216  NA 141 139  K 4.3 3.6  CL 101 102  CO2  --  25  GLUCOSE 148* 163*  BUN 26* 22*  CREATININE 1.10 1.30*  CALCIUM  --  8.9   GFR: Estimated Creatinine Clearance: 49.4 mL/min (by C-G formula based on SCr of 1.3 mg/dL (H)). Liver Function  Tests: No results for input(s): AST, ALT, ALKPHOS, BILITOT, PROT, ALBUMIN in the last 168 hours. No results for input(s): LIPASE, AMYLASE in the last 168 hours. No results for input(s): AMMONIA in the last 168 hours. Coagulation Profile: No results for input(s): INR, PROTIME in the last 168 hours. Cardiac Enzymes: No results for input(s): CKTOTAL, CKMB, CKMBINDEX, TROPONINI in the last 168 hours. BNP (last 3 results) No results for input(s): PROBNP in the last 8760 hours. HbA1C: No results for input(s): HGBA1C in the last 72 hours. CBG: No results for input(s): GLUCAP in the last 168 hours. Lipid Profile: No results for input(s): CHOL, HDL, LDLCALC, TRIG, CHOLHDL, LDLDIRECT in the last 72 hours. Thyroid Function Tests: No results for input(s): TSH, T4TOTAL,  FREET4, T3FREE, THYROIDAB in the last 72 hours. Anemia Panel: No results for input(s): VITAMINB12, FOLATE, FERRITIN, TIBC, IRON, RETICCTPCT in the last 72 hours. Sepsis Labs: No results for input(s): PROCALCITON, LATICACIDVEN in the last 168 hours.  Recent Results (from the past 240 hour(s))  MRSA PCR Screening     Status: None   Collection Time: 08/31/16  9:17 PM  Result Value Ref Range Status   MRSA by PCR NEGATIVE NEGATIVE Final    Comment:        The GeneXpert MRSA Assay (FDA approved for NASAL specimens only), is one component of a comprehensive MRSA colonization surveillance program. It is not intended to diagnose MRSA infection nor to guide or monitor treatment for MRSA infections.      Radiology Studies: Dg Chest Portable 1 View  Result Date: 08/31/2016 CLINICAL DATA:  Acute onset of respiratory distress. Initial encounter. EXAM: PORTABLE CHEST 1 VIEW COMPARISON:  Chest radiograph performed 08/05/2016 FINDINGS: The lungs are hyperexpanded, with flattening of the hemidiaphragms, compatible with COPD. Mild bilateral parenchymal scarring is noted. Peribronchial thickening is noted. There is no evidence of pleural effusion or pneumothorax, though the lung bases are incompletely imaged on this study. The cardiomediastinal silhouette is borderline normal in size. No acute osseous abnormalities are seen. IMPRESSION: Findings of COPD, with mild bilateral parenchymal scarring. No focal airspace consolidation seen. Electronically Signed   By: Garald Balding M.D.   On: 08/31/2016 16:21    Scheduled Meds: . ALPRAZolam  0.5 mg Oral QHS  . aspirin  325 mg Oral Daily  . citalopram  20 mg Oral Daily  . doxycycline (VIBRAMYCIN) IV  100 mg Intravenous Q12H  . enoxaparin (LOVENOX) injection  40 mg Subcutaneous Q24H  . feeding supplement (ENSURE ENLIVE)  237 mL Oral TID BM  . ipratropium-albuterol  3 mL Nebulization Q6H  . [START ON 09/02/2016] mouth rinse  15 mL Mouth Rinse BID  .  methylPREDNISolone (SOLU-MEDROL) injection  80 mg Intravenous Q6H   Followed by  . predniSONE  40 mg Oral Q supper  . metoprolol succinate  12.5 mg Oral Daily  . mometasone-formoterol  2 puff Inhalation BID  . pantoprazole  40 mg Oral Daily  . senna-docusate  2 tablet Oral BID  . sodium chloride flush  3 mL Intravenous Q12H   Continuous Infusions: . sodium chloride       LOS: 1 day    Time spent: > 35 minutes  Velvet Bathe, MD [facility] Pager [phone]  If 7PM-7AM, please contact night-coverage [email or web address] Password [facility] 09/01/2016, 4:15 PM

## 2016-09-01 NOTE — Progress Notes (Signed)
Patient requesting percocet and states he takes it at home. Called MD Wendee Beavers, orders to restart home percocet Q6hours prn.

## 2016-09-01 NOTE — Progress Notes (Signed)
Initial Nutrition Assessment  DOCUMENTATION CODES:   Not applicable  INTERVENTION:   -Ensure Enlive po TID, each supplement provides 350 kcal and 20 grams of protein  NUTRITION DIAGNOSIS:   Inadequate oral intake related to poor appetite as evidenced by meal completion < 25%.  GOAL:   Patient will meet greater than or equal to 90% of their needs  MONITOR:   PO intake, Supplement acceptance, Labs, Weight trends, Skin, I & O's  REASON FOR ASSESSMENT:   Consult COPD Protocol  ASSESSMENT:    [name]  is a 83 y.o. male, With past medical history of COPD , chronic respiratory failure on 4-5 L nasal cannula, hypertension, followed by home hospice(fast COPD discharged to be in place in August 2017), patient presents with worsening dyspnea over last 24 hours,   Pt admitted with COPD exacerbation.  Spoke with pt, who was drowsy at time of visit. He reports he generally has a fairly good appetite, which consists of 2 meals and a snack daily. Per his report, food choices are varied. However, pt shares that he "has not had anything to eat in 30 hours" secondary to difficulty breathing. Breakfast tray untouched at bedside. Per discussion with RN, pt refused breakfast this morning, despite attempts to provide him with alternate food items. Pt reports "I just wasn't feeling it".   Pt endorses weight loss and reports UBW of 210#. Pt unable to provide further wt hx, other than he has lost weight. Wt hx is not consistent with pt report. Noted wt stability over the past 3 years.   Nutrition-Focused physical exam completed. Findings are mild to moderate fat depletion, mild to moderate muscle depletion, and no edema. Difficult to determine whether fat and muscle loss is related to advanced age (which is common in this population) vs limited mobility and/or suboptimal nutritional status. Pt is followed by hospice services at home. Suspect continued nutritional decline given hospice  status.  Labs reviewed.   Diet Order:  Diet regular Room service appropriate? Yes; Fluid consistency: Thin  Skin:  Reviewed, no issues  Last BM:  08/31/16  Height:   Ht Readings from Last 1 Encounters:  09/01/16 6\' 1"  (1.854 m)    Weight:   Wt Readings from Last 1 Encounters:  09/01/16 170 lb (77.1 kg)    Ideal Body Weight:  83.6 kg  BMI:  Body mass index is 22.43 kg/m.  Estimated Nutritional Needs:   Kcal:  1700-1900  Protein:  85-100 grams  Fluid:  1.7-1.9 L  EDUCATION NEEDS:   No education needs identified at this time  Seena Ritacco A. Jimmye Norman, RD, LDN, CDE Pager: [phone] After hours Pager: [phone]

## 2016-09-01 NOTE — Progress Notes (Signed)
[facility] 4N-04-[facility]-GIP RN Visit  This is a covered, related GIP admission from 08/31/16 to [facility] diagnosis of COPD, per Dr. Karie Georges.  Patient is a FULL CODE. Patient called EMS himself after getting "short of breath real bad at home".  Patient didn't tell his daughter that he had called EMS, who lives in the same home.   Daughter told EMS, "I checked on him a few minutes before and he was fine".  Patient was admitted for acute on chronic hypoxic respiratory failure and COPD exacerbation.  Visited patient in room.  Patient alert and oriented.  Patient noted to be on 5 L [place].  He reported wearing the BiPap last night and stated it caused discomfort to his face.  He stated he has not had any food intake in 30 hours.  Food tray at bedside untouched and he verbalized he is not ready to eat.  Patient is receiving IV Vibramycin Q 12 hours and scheduled nebulizer treatments. Influenza PCR results were negative. He reports his pain a 6 out of 10 to his neck and right side. RN administered 1 Percocet for pain of 7-8 and reported minimal relief.  Per hospice chart review, patient has ordered Morphine Concentrate 20mg /ml 10-20 mg/0.5 -1 ml Q 4 hours per pain and/or dyspnea.  Patient also takes 0.5 mg Xanax po Q hs as needed.  He reports feeling "anxious" d/t difficulty breathing and lack of sleep.  Spoke to patient about code status. Explained that d/t patient's worsening disease process, there is a significant chance that if he were to be intubated, he would not be able to be weaned. He verbalized understanding and stated he would like to discuss further with his son. Discussed hospice medication list with attending MD and conversation re: code status. Also discussed with bedside RN, Lexie. No family present at time of visit. Hospice will continue to follow daily and anticipate any discharge needs.  Updated [facility] medication list placed on patient's shadow chart.  Please contact with  any hospice-related questions or concerns.  Thank you, Freddi Starr RN, BSN [facility] Liaison [phone]  All [facility] hospice liaisons now live on [place]. Please call [phone] after 6PM.

## 2016-09-01 NOTE — Care Management Note (Signed)
Case Management Note  Patient Details  Name: [name] MRN: [id number] Date of Birth: 13-Jul-1934  Subjective/Objective:   Adm w copd                 Action/Plan: lives w da at home, act w hospice and paliative care of [place].   Expected Discharge Date:  02/17/16               Expected Discharge Plan:  Home w Hospice Care  In-House Referral:     Discharge planning Services  CM Consult  Post Acute Care Choice:    Choice offered to:     DME Arranged:    DME Agency:     HH Arranged:  RN, Social Work [employer] Agency:  [facility] [place] of Service:  In process, will continue to follow  If discussed at Long Length of Stay Meetings, dates discussed:    Additional Comments: will cont to follow.[facility] aware of pt's adm.  Lacretia Leigh, RN 09/01/2016, 10:42 AM

## 2016-09-01 NOTE — Progress Notes (Signed)
RN noticed patient rhythm is A fib rate 100-115, obtained EKG to confirm. Paged Dr. Wendee Beavers and made aware. New orders received.

## 2016-09-02 ENCOUNTER — Inpatient Hospital Stay (HOSPITAL_COMMUNITY)

## 2016-09-02 DIAGNOSIS — I447 Left bundle-branch block, unspecified: Secondary | ICD-10-CM

## 2016-09-02 LAB — ECHOCARDIOGRAM COMPLETE
HEIGHTINCHES: 73 in
WEIGHTICAEL: 2720 [oz_av]

## 2016-09-02 MED ORDER — PERFLUTREN LIPID MICROSPHERE
1.0000 mL | INTRAVENOUS | Status: AC | PRN
  Administered 2016-09-02: 2 mL via INTRAVENOUS
  Filled 2016-09-02: qty 10

## 2016-09-02 MED ORDER — PREDNISONE 10 MG (21) PO TBPK: 10.0000 mg | ORAL_TABLET | ORAL | 0 refills | Status: AC

## 2016-09-02 MED ORDER — DOXYCYCLINE HYCLATE 100 MG PO CAPS: 100.0000 mg | ORAL_CAPSULE | Freq: Two times a day (BID) | ORAL | 0 refills | Status: AC

## 2016-09-02 NOTE — Progress Notes (Signed)
CSW received call regarding patients prescriptions . CSW contacted Dr. Wendee Beavers who reported patients scripts where on chart and could be taken with patient at DC. CSW will update RN.   Kingsley Spittle, LCSWA Clinical Social Worker [phone]

## 2016-09-02 NOTE — Progress Notes (Signed)
Pt left at this time via stretcher with ambulance transport.  No s/s of any acute distress or pain.

## 2016-09-02 NOTE — Clinical Social Work Placement (Signed)
   CLINICAL SOCIAL WORK PLACEMENT  NOTE  Date:  09/02/2016  Patient Details  Name: [name] MRN: [id number] Date of Birth: Jan 03, 1934  Clinical Social Work is seeking post-discharge placement for this patient at the [place] level of care (*CSW will initial, date and re-position this form in  chart as items are completed):  Yes   Patient/family provided with [place] Work Department's list of facilities offering this level of care within the geographic area requested by the patient (or if unable, by the patient's family).  Yes   Patient/family informed of their freedom to choose among providers that offer the needed level of care, that participate in Medicare, Medicaid or managed care program needed by the patient, have an available bed and are willing to accept the patient.  Yes   Patient/family informed of [place]'s ownership interest in [facility] and [facility], as well as of the fact that they are under no obligation to receive care at these facilities.  PASRR submitted to EDS on       PASRR number received on       Existing PASRR number confirmed on 09/02/16     FL2 transmitted to all facilities in geographic area requested by pt/family on 09/02/16     FL2 transmitted to all facilities within larger geographic area on       Patient informed that his/her managed care company has contracts with or will negotiate with certain facilities, including the following:        Yes   Patient/family informed of bed offers received.  Patient chooses bed at  [facility])     Physician recommends and patient chooses bed at      Patient to be transferred to  [facility]) on 09/02/16.  Patient to be transferred to facility by  [facility])     Patient family notified on 09/02/16 of transfer.  Name of family member notified:        PHYSICIAN       Additional Comment:    _______________________________________________ Serafina Mitchell, [place] 09/02/2016, 3:33 PM

## 2016-09-02 NOTE — Progress Notes (Signed)
No response from Dr. Wendee Beavers.  Paged again.  No response from on call social worker ,so called ED on call social worker who is trying to help.  Notified Hospice social worker of all that is going on and just awaiting response from MD or Education officer, museum.

## 2016-09-02 NOTE — Progress Notes (Signed)
Report given to [place].

## 2016-09-02 NOTE — Progress Notes (Addendum)
pts wife called, states, "went to pick up meds from pharmacy and they have not been called in.  Social worker is who said for [place] to pick them up."  Paged Social worker on call at this time to clarify for family.  Also called phone and left message on voicemail.

## 2016-09-02 NOTE — Progress Notes (Signed)
[facility] 4N-04-[facility] -[facility]-GIP RN Visit  This is a covered, related GIP admission from 08/31/16 to [facility] diagnosis of COPD, per Dr. Karie Georges.  Patient is a FULL CODE. Patient called EMS himself after getting "short of breath real bad at home". Patient didn't tell his daughter that he had called EMS, who lives in the same home. Daughter told EMS, "I checked on him a few minutes before and he was fine". Patient was admitted for acute on chronic hypoxic respiratory failure and COPD exacerbation.  Visited patient in room, with RN at bedside.  Patient alert, oriented and no shortness of breath noted in conversation.  Patient remains on 5L [place] with respirations WNL.  Patient stated he did not require BiPap overnight.  Patient continues to receive IV Vibramycin Q 12 hours and scheduled nebulizer treatments.  Patient was administered 0.5 mg po for anxiety, 3 doses of 1 5/325 mg Percocet, 1 dose of 4mg  IV Zofran, and 1 dose 6mg  Roxanol the past 24 hours, per chart review.  Patient currently c/o pain of 5-6 out of 10 to neck and right side.  RN plans to administer PRN dose of percocet.  Spoke to patient about home situation.  Patient currently resides with his daughter, Merryl Hacker, who has been sick.  Prior to this hospital admission, the hospice social worker, was working on placement for a respite stay to alleviate Kyran during her illness. There may be a possibility of patient discharging from hospital and going to a facility for respite care, although this has not been confirmed.  Whitney, [facility] Education officer, museum, plans to visit patient today to assist with discharge planning.    HLT will continue to follow daily.  Please contact with any hospice-related questions or concerns.  Thank you, Freddi Starr RN, BSN [facility] Liaison [phone]  All [facility] hospice liaisons now live on [place]. Please call [phone] after 6PM.

## 2016-09-02 NOTE — Progress Notes (Signed)
[id number]  

## 2016-09-02 NOTE — Progress Notes (Signed)
  Echocardiogram 2D Echocardiogram with Definity has been performed.  Diamond Nickel 09/02/2016, 12:05 PM

## 2016-09-02 NOTE — Progress Notes (Signed)
Spoke w [name] at hospice and paliative care of [place]. Pt was full code pta. Hospice sw and  Hospital sw working on snf for respite care for pt at [facility]. fac needs pt to be there after 6pm after son can sign pt in. [place] co ems to transport.

## 2016-09-02 NOTE — Consult Note (Signed)
   [facility] CM Inpatient Consult   09/02/2016  [name] 05-15-1934 [id number]   Chart reviewed for re-admission on the [place] with [facility].    Chart reveals the patient is active with Hospice and [place].  No [facility] Care Management needs for restart of services.  Patient has full care management with Hospice.  Will sign off.  Natividad Brood, RN BSN [facility] Liaison  [phone] business mobile phone Toll free office [phone]

## 2016-09-02 NOTE — Care Management Note (Signed)
Clinical Social Worker facilitated patient discharge including contacting patient family and facility (Whitney with [facility]) to confirm patient discharge plans. Clinical information faxed to facility ([facility]) and family agreeable with plan.  CSW notified RNCM who arranged ambulance transport via [place] to [place]. Nurse to call report [phone] prior to discharge.  Clinical Social Worker will sign off for now as social work intervention is no longer needed. Please consult [place] again if new need arises.  Keah Lamba B. Joline Maxcy Clinical Social Work Dept Weekend Social Worker [phone] 3:28 PM

## 2016-09-02 NOTE — Discharge Summary (Addendum)
Physician Discharge Summary  [name] [id number] DOB: 10-31-1933 DOA: 08/31/2016  Dalton: [name]  Admit date: 08/31/2016 Discharge date: 09/02/2016  Time spent: > 35 minutes  Recommendations for Outpatient Follow-up:  1. Please ensure hospice services continue on discharge   Discharge Diagnoses:  Active Problems:   COPD exacerbation (HCC)   Acute on chronic respiratory failure (HCC)   Atrial tachycardia, multifocal ([place])   Discharge Condition: stable  Diet recommendation: regular diet  Filed Weights   09/01/16 1436  Weight: 77.1 kg (170 lb)    History of present illness:    83 y.o. male, With past medical history of COPD , chronic respiratory failure on 4-5 L nasal cannula, hypertension, followed by home hospice(fast COPD discharged to be in place in August 2017), patient presents with worsening dyspnea over last 24 hours.  Pt was diagnosed with copd exacerbation  Hospital Course:  COPD exacerbation - will d/c on prednisone taper and doxycycline - otherwise continue home medication regimen - initially improved on bipap. Has not required bipap per my discussion with nursing  Otherwise continue home medication regimen. For other known Medical conditions listed above  Procedures:  None  Consultations:  None  Discharge Exam: Vitals:   09/02/16 1000 09/02/16 1100  BP:    Pulse: 97 96  Resp: (!) 25 17  Temp:      General: Pt in nad,alert and awake Cardiovascular: rrr, no rubs Respiratory: no increased wob, no wheezes  Discharge Instructions   Discharge Instructions    Call MD for:  difficulty breathing, headache or visual disturbances    Complete by:  As directed    Call MD for:  temperature >100.4    Complete by:  As directed    Diet - low sodium heart healthy    Complete by:  As directed    Increase activity slowly    Complete by:  As directed      Current Discharge Medication List    START taking these medications   Details   doxycycline (VIBRAMYCIN) 100 MG capsule Take 1 capsule (100 mg total) by mouth 2 (two) times daily. Qty: 10 capsule, Refills: 0    predniSONE (STERAPRED UNI-PAK 21 TAB) 10 MG (21) TBPK tablet Take 1 tablet (10 mg total) by mouth as directed. Please take 50 mg by mouth for the next 2 days, then take 40 mg by mouth for the next 2 days, then take 30 mg by mouth for the next 2 days, then take 20 mg by mouth for the next 2 days, then take 10 mg by mouth next 2 days. Qty: 36 tablet, Refills: 0      CONTINUE these medications which have NOT CHANGED   Details  ALPRAZolam (XANAX) 0.5 MG tablet Take 1 tablet (0.5 mg total) by mouth at bedtime. Qty: 20 tablet, Refills: 0    !! benzonatate (TESSALON) 100 MG capsule Take 200 mg by mouth 3 (three) times daily as needed for cough.    bisacodyl (DULCOLAX) 10 MG suppository Place 10 mg rectally daily as needed for moderate constipation.    budesonide-formoterol (SYMBICORT) 160-4.5 MCG/ACT inhaler Inhale 2 puffs into the lungs 2 (two) times daily.    citalopram (CELEXA) 20 MG tablet Take 20 mg by mouth daily.    guaifenesin (HUMIBID E) 400 MG TABS tablet Take 400 mg by mouth every 4 (four) hours as needed (for congestion).    ipratropium-albuterol (DUONEB) 0.5-2.5 (3) MG/3ML SOLN Take 3 mLs by nebulization 4 (four)  times daily.     LORazepam (ATIVAN) 0.5 MG tablet Take 0.5 mg by mouth 2 (two) times daily as needed for anxiety.  Refills: 0    morphine (ROXANOL) 20 MG/ML concentrated solution Take 6 mg by mouth every 4 (four) hours as needed for moderate pain, severe pain or shortness of breath.     omeprazole (PRILOSEC) 40 MG capsule Take 40 mg by mouth 2 (two) times daily.  Refills: 3    oxyCODONE-acetaminophen (PERCOCET/ROXICET) 5-325 MG tablet Take 1 tablet by mouth 4 (four) times daily.     sennosides-docusate sodium (SENOKOT-S) 8.6-50 MG tablet Take 2-4 tablets by mouth 2 (two) times daily as needed for constipation.     !! benzonatate  (TESSALON) 200 MG capsule Take 1 capsule (200 mg total) by mouth 3 (three) times daily as needed for cough. Qty: 20 capsule, Refills: 0     !! - Potential duplicate medications found. Please discuss with provider.     No Known Allergies    The results of significant diagnostics from this hospitalization (including imaging, microbiology, ancillary and laboratory) are listed below for reference.    Significant Diagnostic Studies: Dg Chest 2 View  Result Date: 08/05/2016 CLINICAL DATA:  83 year old male on hospice care status post a fall today. Right shoulder pain. EXAM: CHEST  2 VIEW COMPARISON:  Chest x-ray 02/16/2016. FINDINGS: Lungs are hyperexpanded with emphysematous changes. No consolidative airspace disease. No pleural effusions. No pneumothorax. No pulmonary nodule or mass noted. Pulmonary vasculature and the cardiomediastinal silhouette are within normal limits. Atherosclerosis in the thoracic aorta. IMPRESSION: 1. No radiographic evidence of acute cardiopulmonary disease. 2. Aortic atherosclerosis. 3. Emphysema. Electronically Signed   By: Vinnie Langton M.D.   On: 08/05/2016 12:14   Dg Shoulder Right  Result Date: 08/05/2016 CLINICAL DATA:  [place] with right shoulder pain. EXAM: RIGHT SHOULDER - 2+ VIEW COMPARISON:  04/23/2013 FINDINGS: Right shoulder is located. There is no evidence for an acute fracture. Right AC joint is intact. Chronic irregularity of the right upper rib cage. Emphysematous changes in the right lung. IMPRESSION: No acute abnormality involving the right shoulder. Electronically Signed   By: Markus Daft M.D.   On: 08/05/2016 11:46   Dg Chest Portable 1 View  Result Date: 08/31/2016 CLINICAL DATA:  Acute onset of respiratory distress. Initial encounter. EXAM: PORTABLE CHEST 1 VIEW COMPARISON:  Chest radiograph performed 08/05/2016 FINDINGS: The lungs are hyperexpanded, with flattening of the hemidiaphragms, compatible with COPD. Mild bilateral parenchymal scarring is  noted. Peribronchial thickening is noted. There is no evidence of pleural effusion or pneumothorax, though the lung bases are incompletely imaged on this study. The cardiomediastinal silhouette is borderline normal in size. No acute osseous abnormalities are seen. IMPRESSION: Findings of COPD, with mild bilateral parenchymal scarring. No focal airspace consolidation seen. Electronically Signed   By: Garald Balding M.D.   On: 08/31/2016 16:21    Microbiology: Recent Results (from the past 240 hour(s))  MRSA PCR Screening     Status: None   Collection Time: 08/31/16  9:17 PM  Result Value Ref Range Status   MRSA by PCR NEGATIVE NEGATIVE Final    Comment:        The GeneXpert MRSA Assay (FDA approved for NASAL specimens only), is one component of a comprehensive MRSA colonization surveillance program. It is not intended to diagnose MRSA infection nor to guide or monitor treatment for MRSA infections.      Labs: Basic Metabolic Panel:  Recent Labs Lab 08/31/16 1627  09/01/16 0216  NA 141 139  K 4.3 3.6  CL 101 102  CO2  --  25  GLUCOSE 148* 163*  BUN 26* 22*  CREATININE 1.10 1.30*  CALCIUM  --  8.9   Liver Function Tests: No results for input(s): AST, ALT, ALKPHOS, BILITOT, PROT, ALBUMIN in the last 168 hours. No results for input(s): LIPASE, AMYLASE in the last 168 hours. No results for input(s): AMMONIA in the last 168 hours. CBC:  Recent Labs Lab 08/31/16 1620 08/31/16 1627 09/01/16 0216  WBC 11.2*  --  10.0  NEUTROABS 6.5  --   --   HGB 14.4 15.6 13.7  HCT 45.3 46.0 43.1  MCV 95.6  --  94.3  PLT 128*  --  105*   Cardiac Enzymes: No results for input(s): CKTOTAL, CKMB, CKMBINDEX, TROPONINI in the last 168 hours. BNP: BNP (last 3 results) No results for input(s): BNP in the last 8760 hours.  ProBNP (last 3 results) No results for input(s): PROBNP in the last 8760 hours.  CBG: No results for input(s): GLUCAP in the last 168 hours.   SignedVelvet Bathe MD.  [facility] 09/02/2016, 11:47 AM  Addendum:  Pt had echocardiogram reporting:  Left ventricle: Wall thickness was increased in a pattern of   severe LVH. Systolic function was severely reduced. The estimated   ejection fraction was in the range of 25% to 30%. Akinesis of the   mid-apicalanteroseptal myocardium.  Continue B blocker and home hospice services.

## 2016-09-15 DEATH — deceased

## 2017-10-15 IMAGING — CT CT CTA ABD/PEL W/CM AND/OR W/O CM
3 of 9 series · 11 of 46 positions shown, 17 images · IV contrast (isovue)
Comparison: 09/14/2015

CLINICAL DATA: 82-year-old with severe shortness of breath and
diffuse abdominal pain. Lack of bowel movements.

EXAM:
CT ANGIOGRAPHY ABDOMEN AND PELVIS
TECHNIQUE: Multidetector CT imaging of the abdomen and pelvis was performed
using the standard protocol during bolus administration of
intravenous contrast. Multiplanar reconstructed images including
MIPs were obtained and reviewed to evaluate the vascular anatomy.
CONTRAST:  100 mL Isovue 370

[Series 5: arterial 3.0 b30f · axial · arterial · 0.77mm/px · z∈[-783,-729]mm · 2 of 149 slices shown]
[im 10/149  soft-tissue]
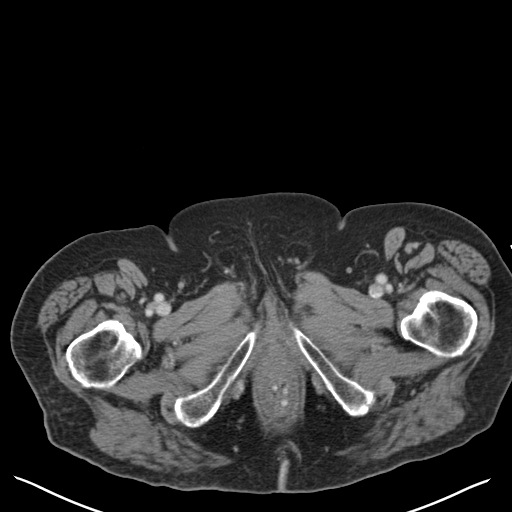
[im 28/149  soft-tissue]
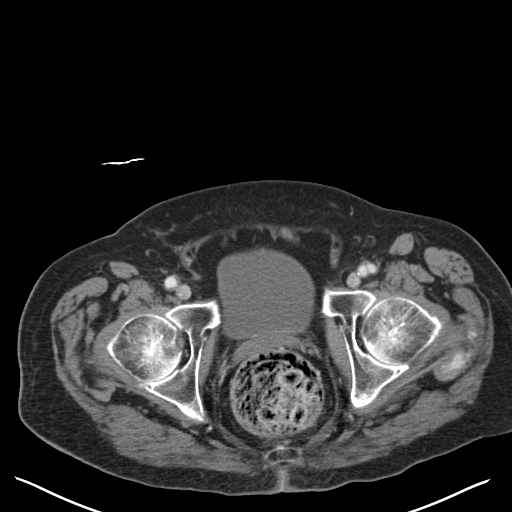

[Series 6: venous 5.0 b30f · axial · portal-venous · 0.77mm/px · z∈[-774,-460]mm · 7 of 85 slices shown, 12 images]
[im 11/85  soft-tissue]
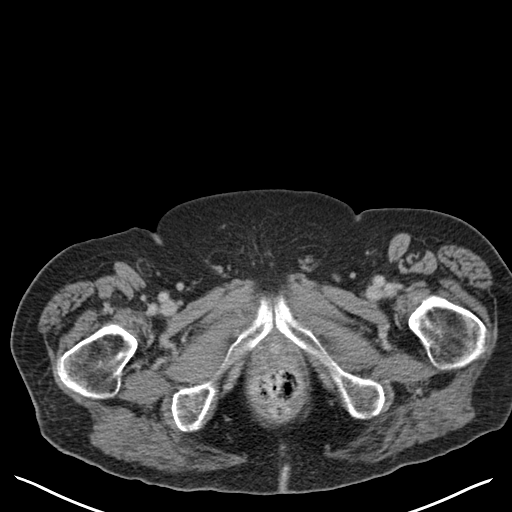
[im 11/85  bone]
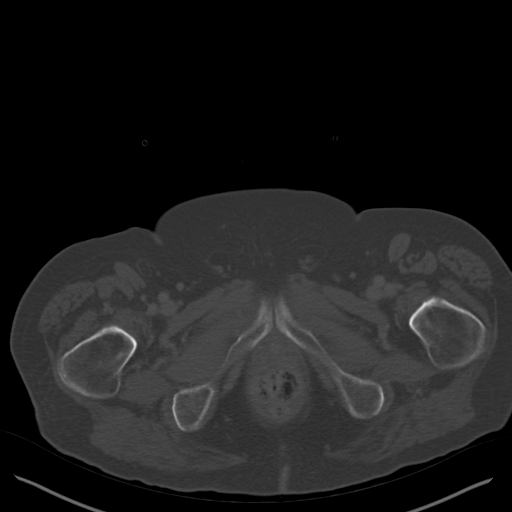
[im 22/85  soft-tissue]
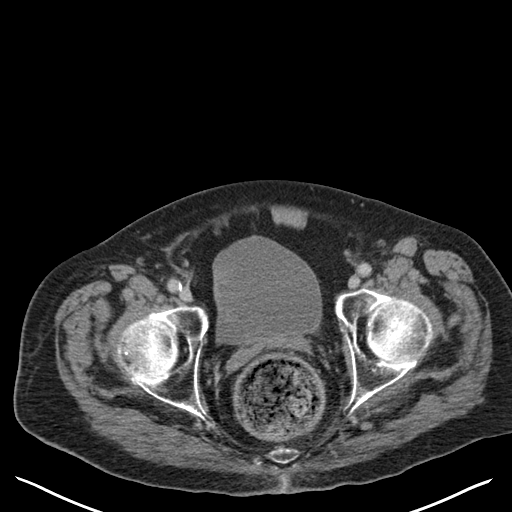
[im 32/85  soft-tissue]
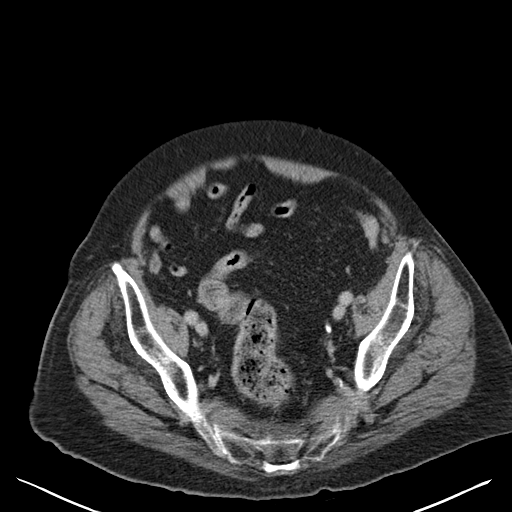
[im 43/85  soft-tissue]
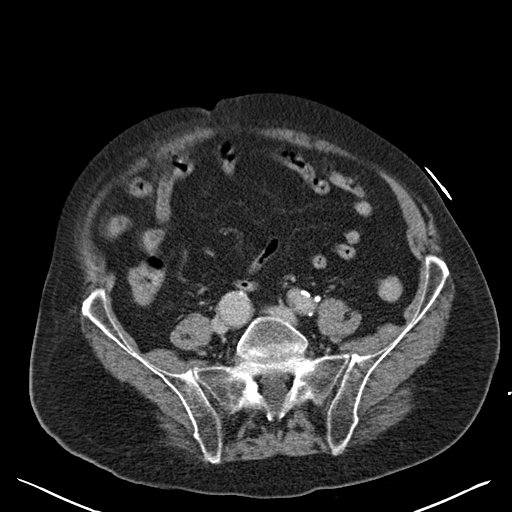
[im 43/85  lung]
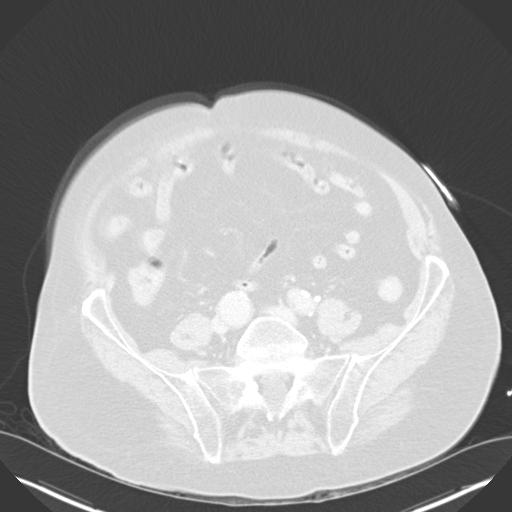
[im 53/85  soft-tissue]
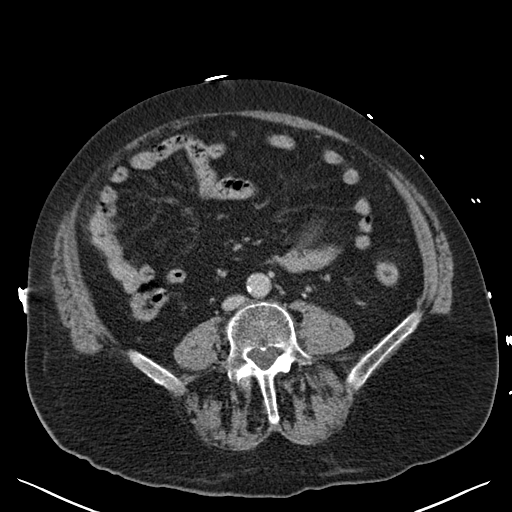
[im 53/85  lung]
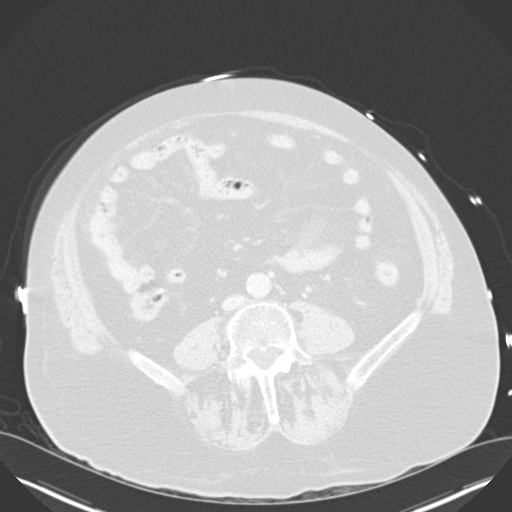
[im 64/85  soft-tissue]
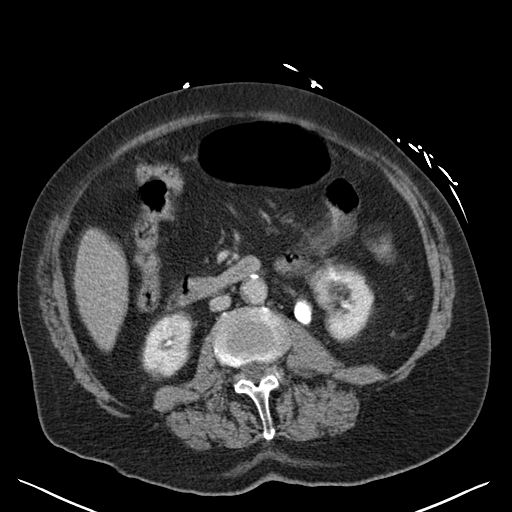
[im 64/85  lung]
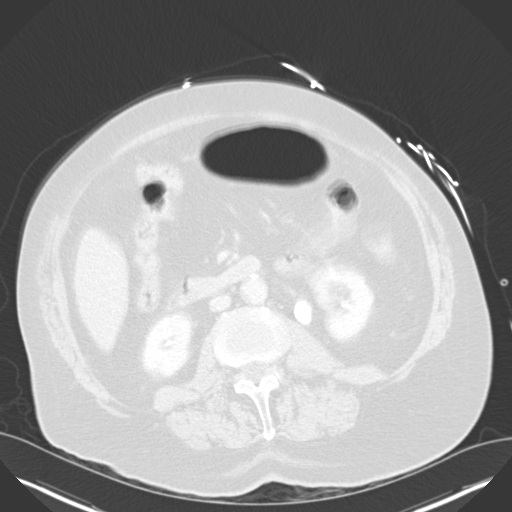
[im 74/85  soft-tissue]
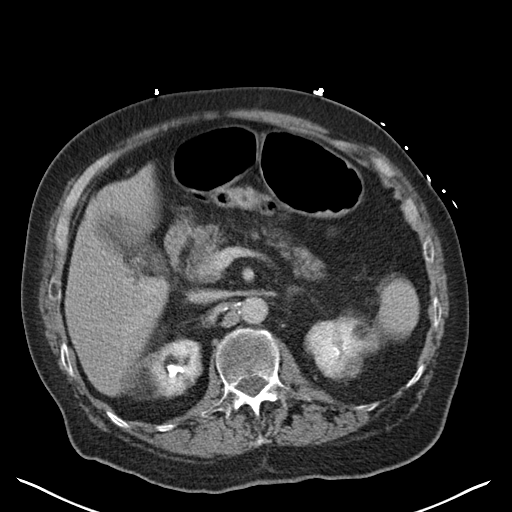
[im 74/85  lung]
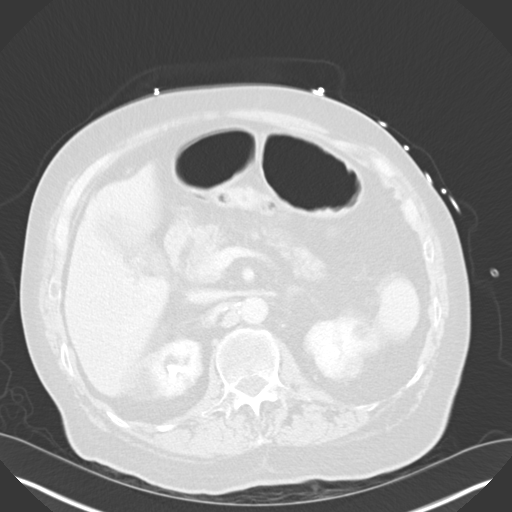

[Series 8: coronal · coronal · 0.82mm/px · 2 of 167 slices shown, 3 images]
[im 56/167  soft-tissue]
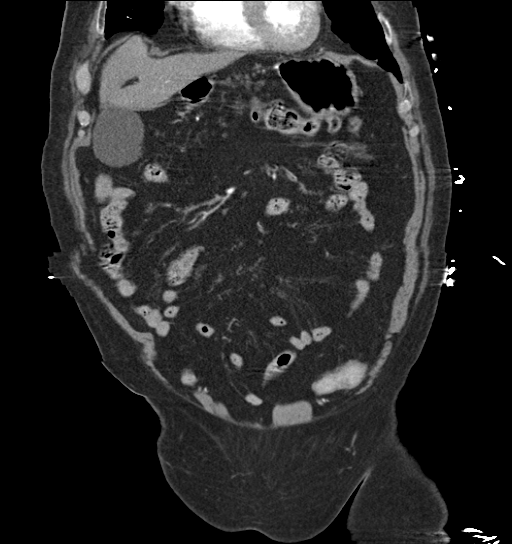
[im 56/167  bone]
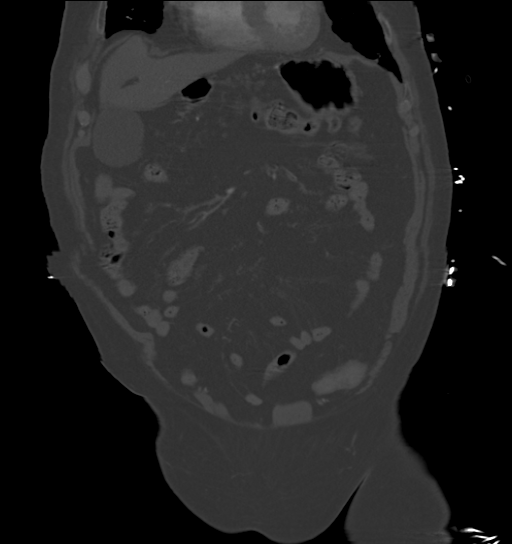
[im 111/167  soft-tissue]
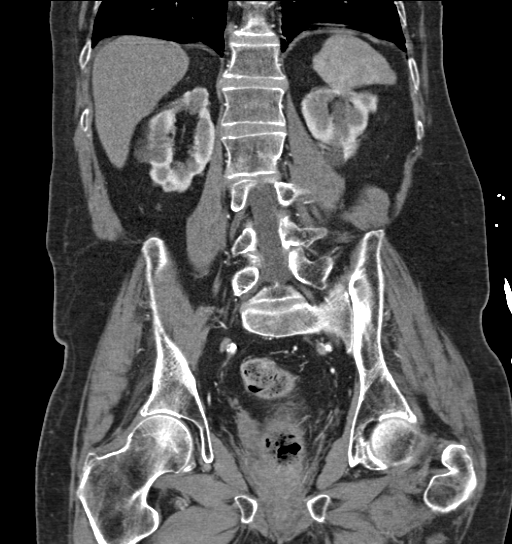

[11 of 46 positions shown; findings below may reference images not displayed]

FINDINGS: ARTERIAL FINDINGS:

Aorta: Atherosclerotic disease in the abdominal aorta without
aneurysm or dissection.

Celiac axis: Common trunk of the SMA and celiac trunk. Mild plaque
in the common trunk without significant stenosis. The common hepatic
artery and splenic artery are patent.

Superior mesenteric: Superior mesenteric artery main branches are
patent.

Left renal: Single left renal artery is widely patent with mild
atherosclerotic disease.

Right renal: There are 2 right renal arteries. The main right renal
artery has focal noncalcified plaque 1 cm from the origin causing at
least 75% narrowing of the lumen. The inferior accessory right renal
artery appears to be patent.

Inferior mesenteric: Inferior mesenteric artery is patent.

Left iliac: Atherosclerotic plaque and ectasia in the left common
iliac artery, measuring up to 1.9 cm. Ectasia of the proximal left
internal iliac artery measuring up to 1.7 cm. Left external iliac
artery is patent. Proximal left femoral arteries are patent.

Right iliac: Irregular atherosclerotic plaque in the proximal right
common iliac artery. There is a focal aneurysm of the distal right
common iliac artery measuring up to 2.6 cm and previously measured
2.4 cm. Diffuse atherosclerotic disease in the right internal iliac
artery. Right external iliac artery is patent. Proximal right
femoral arteries are patent. There may be a small amount of plaque
in the proximal right SFA distally, partially visualized.

Venous findings: The left common iliac vein is compressed by the
right common iliac artery which is a normal anatomic configuration
and no evidence for a DVT. IVC and renal veins are patent. Limited
evaluation of the portal venous system due to motion artifact.

Review of the MIP images confirms the above findings.

NONVASCULAR FINDINGS:

Lung bases demonstrate diffuse emphysematous changes. No pleural
effusions. Multiple gallstones without evidence for gallbladder
inflammation. Normal appearance of the liver. No biliary dilatation.
Normal appearance of the pancreas without inflammation or duct
dilatation. Normal appearance of spleen without enlargement. Normal
appearance of the adrenal glands. Again noted is a large stone in
left renal pelvis measuring up to 1.3 cm without significant
hydronephrosis. Evidence for cortical and parapelvic left renal
cysts. Again noted is a 0.7 cm stone in the right kidney lower pole.
Again noted is a exophytic right renal cyst. No significant right
hydronephrosis. Normal appearance of the urinary bladder. Rectal
distension due to stool. There is mild perirectal and presacral
edema. Normal appearance of stomach and small bowel. No evidence for
bowel obstruction. No suspicious lymphadenopathy in the abdomen or
pelvis. No significant free fluid. No free air. No acute bone
abnormality. No gross abnormality to the prostate or seminal
vesicles.
IMPRESSION: Rectal distension due to a large amount of stool with mild
perirectal and presacral edema. Findings are suggestive for
constipation.

Atherosclerotic disease involving the aorta, iliac arteries and
visceral arteries. No significant stenosis in the mesenteric
arteries with a common trunk for the celiac trunk and SMA.

Stenosis in the main right renal artery due to focal noncalcified
plaque.

Aneurysm of the right common iliac artery measuring up to 2.6 cm.

Bilateral nephrolithiasis without hydronephrosis.

Cholelithiasis.

Bilateral renal cysts.
